# Patient Record
Sex: Male | Born: 1970
Health system: Southern US, Community
[De-identification: ages and names within clinical notes are randomized; demographics above are authoritative.]

## PROBLEM LIST (undated history)

## (undated) DIAGNOSIS — R7303 Prediabetes: Secondary | ICD-10-CM

## (undated) DIAGNOSIS — F32A Depression, unspecified: Secondary | ICD-10-CM

## (undated) DIAGNOSIS — M1990 Unspecified osteoarthritis, unspecified site: Secondary | ICD-10-CM

## (undated) DIAGNOSIS — F419 Anxiety disorder, unspecified: Secondary | ICD-10-CM

## (undated) DIAGNOSIS — G629 Polyneuropathy, unspecified: Secondary | ICD-10-CM

## (undated) DIAGNOSIS — G2581 Restless legs syndrome: Secondary | ICD-10-CM

## (undated) DIAGNOSIS — Z9989 Dependence on other enabling machines and devices: Secondary | ICD-10-CM

## (undated) DIAGNOSIS — E119 Type 2 diabetes mellitus without complications: Secondary | ICD-10-CM

## (undated) DIAGNOSIS — G4733 Obstructive sleep apnea (adult) (pediatric): Secondary | ICD-10-CM

## (undated) DIAGNOSIS — F329 Major depressive disorder, single episode, unspecified: Secondary | ICD-10-CM

## (undated) DIAGNOSIS — A4902 Methicillin resistant Staphylococcus aureus infection, unspecified site: Secondary | ICD-10-CM

## (undated) DIAGNOSIS — G43909 Migraine, unspecified, not intractable, without status migrainosus: Secondary | ICD-10-CM

## (undated) DIAGNOSIS — I10 Essential (primary) hypertension: Secondary | ICD-10-CM

## (undated) DIAGNOSIS — K219 Gastro-esophageal reflux disease without esophagitis: Secondary | ICD-10-CM

## (undated) DIAGNOSIS — F988 Other specified behavioral and emotional disorders with onset usually occurring in childhood and adolescence: Secondary | ICD-10-CM

## (undated) HISTORY — DX: Essential (primary) hypertension: I10

## (undated) HISTORY — DX: Depression, unspecified: F32.A

## (undated) HISTORY — DX: Anxiety disorder, unspecified: F41.9

## (undated) HISTORY — DX: Polyneuropathy, unspecified: G62.9

## (undated) HISTORY — DX: Other specified behavioral and emotional disorders with onset usually occurring in childhood and adolescence: F98.8

## (undated) HISTORY — PX: WISDOM TOOTH EXTRACTION: SHX21

## (undated) HISTORY — DX: Type 2 diabetes mellitus without complications: E11.9

## (undated) HISTORY — DX: Major depressive disorder, single episode, unspecified: F32.9

## (undated) HISTORY — DX: Gastro-esophageal reflux disease without esophagitis: K21.9

## (undated) HISTORY — PX: NO PAST SURGERIES: SHX2092

## (undated) HISTORY — PX: AMPUTATION: SHX166

---

## 2013-04-13 ENCOUNTER — Ambulatory Visit: Payer: Commercial Managed Care - PPO | Admitting: Neurology

## 2013-04-14 ENCOUNTER — Ambulatory Visit (INDEPENDENT_AMBULATORY_CARE_PROVIDER_SITE_OTHER): Payer: Commercial Managed Care - PPO | Admitting: Neurology

## 2013-04-14 ENCOUNTER — Encounter: Payer: Self-pay | Admitting: Neurology

## 2013-04-14 VITALS — BP 133/92 | HR 80 | Temp 98.4°F | Ht 74.0 in | Wt 258.0 lb

## 2013-04-14 DIAGNOSIS — G609 Hereditary and idiopathic neuropathy, unspecified: Secondary | ICD-10-CM

## 2013-04-14 DIAGNOSIS — G629 Polyneuropathy, unspecified: Secondary | ICD-10-CM | POA: Insufficient documentation

## 2013-04-14 NOTE — Progress Notes (Signed)
History of present illness:  [name] is a 42 years old right-handed Caucasian male, referred by his primary care physician Dr. Merchandiser, retail for evaluation of peripheral neuropathy  He had a past medical history sleep apnea, has been using CPAP machine for one year, high blood pressure, adult onset ADHD, he noticed a few year history of bilateral feet numbness, but no significant burning, stinging, needle taking, after prolonged weightbearing, he complains of soreness of bilateral feet. He denies significant gait difficulty,  Few months ago, he had right foot tablets, was evaluated by podiatrist, at evaluation of glucose, at 82, he denied family history of neuromuscular disease, he noticed hair-line in his midcalf level, there was shiny skin at distal leg  He drinks alcohol regularly, 3 x12 oz hard liquor mixed with soft drink each night  Laboratory Mar 22 2013, normal CBC, TSH, B12 350,  Review of Systems  Out of a complete 14 system review, the patient complains of only the following symptoms, and all other reviewed systems are negative.   Constitutional:   N/A Cardiovascular:  N/A Ear/Nose/Throat:  N/A Skin: N/A Eyes: N/A Respiratory: N/A Gastroitestinal: N/A    Hematology/Lymphatic:  N/A Endocrine:  N/A Musculoskeletal:N/A Allergy/Immunology: N/A Neurological: Numbness Psychiatric:    N/A  PHYSICAL EXAMINATOINS:  Generalized: In no acute distress  Neck: Supple, no carotid bruits   Cardiac: Regular rate rhythm  Pulmonary: Clear to auscultation bilaterally  Musculoskeletal: No deformity  Neurological examination  Mentation: Alert oriented to time, place, history taking, and causual conversation  Cranial nerve II-XII: Pupils were equal round reactive to light extraocular movements were full, visual field were full on confrontational test. facial sensation and strength were normal. hearing was intact to finger rubbing bilaterally. Uvula tongue midline.  head turning and  shoulder shrug and were normal and symmetric.Tongue protrusion into cheek strength was normal.  Motor: he has bilateral toe flexion/extension, weakness. Hair line at midcalf, shiny skin distally  Sensory: length dependent decreased fine touch, pinprick to midcalf level, absent toe vibratory sensation, and proprioception at toes  Coordination: Normal finger to nose, heel-to-shin bilaterally there was no truncal ataxia  Gait: Rising up from seated position without assistance, steady, but dragging his bilateral foot mildly, he has much difficulty standing up on tiptoe, or heels, Romberg signs: mild positive  Deep tendon reflexes: Brachioradialis absent, biceps absent, triceps absent Patellar 1/1, Achilles absent, plantar responses were flexor bilaterally.  Assessment and plan: 42 years old right-handed Caucasian male, with a long-standing history of rapid onset length dependent sensory changes, lack of positive sensory complaints,  1. Hereditary Vs. acquired peripheral neuropathy, with mixed demyelinating, and axonal features. 2 EMG nerve conduction study 3 Laboratory evaluations. Marland Kitchen

## 2013-04-18 LAB — IFE AND PE, SERUM
Albumin SerPl Elph-Mcnc: 4.2 g/dL (ref 3.2–5.6)
Albumin/Glob SerPl: 1.4 (ref 0.7–2.0)
B-Globulin SerPl Elph-Mcnc: 1.1 g/dL (ref 0.6–1.3)
Gamma Glob SerPl Elph-Mcnc: 1.2 g/dL (ref 0.5–1.6)
Globulin, Total: 3.2 g/dL (ref 2.0–4.5)
IgG (Immunoglobin G), Serum: 1227 mg/dL (ref 700–1600)

## 2013-04-18 LAB — ANA: Anti Nuclear Antibody(ANA): NEGATIVE

## 2013-04-18 LAB — RHEUMATOID FACTOR: Rhuematoid fact SerPl-aCnc: 7.7 IU/mL (ref 0.0–13.9)

## 2013-04-18 LAB — HEPATITIS B SURFACE ANTIBODY,QUALITATIVE: Hep B Surface Ab, Qual: NONREACTIVE

## 2013-04-18 LAB — RPR: RPR: NONREACTIVE

## 2013-04-18 LAB — SEDIMENTATION RATE: Sed Rate: 9 mm/hr (ref 0–15)

## 2013-04-18 LAB — HIV ANTIBODY (ROUTINE TESTING W REFLEX): HIV 1/O/2 Abs-Index Value: <1 (ref <1.00)

## 2013-04-28 ENCOUNTER — Encounter: Payer: Commercial Managed Care - PPO | Admitting: Radiology

## 2013-04-28 ENCOUNTER — Encounter: Payer: Commercial Managed Care - PPO | Admitting: Neurology

## 2013-05-25 ENCOUNTER — Ambulatory Visit (INDEPENDENT_AMBULATORY_CARE_PROVIDER_SITE_OTHER): Payer: 59 | Admitting: Neurology

## 2013-05-25 ENCOUNTER — Encounter (INDEPENDENT_AMBULATORY_CARE_PROVIDER_SITE_OTHER): Payer: 59

## 2013-05-25 DIAGNOSIS — G629 Polyneuropathy, unspecified: Secondary | ICD-10-CM

## 2013-05-25 DIAGNOSIS — Z0289 Encounter for other administrative examinations: Secondary | ICD-10-CM

## 2013-05-25 DIAGNOSIS — G609 Hereditary and idiopathic neuropathy, unspecified: Secondary | ICD-10-CM

## 2013-05-25 NOTE — Procedures (Signed)
    [facility]  NCS (NERVE CONDUCTION STUDY) WITH EMG (ELECTROMYOGRAPHY) REPORT   STUDY DATE: 05/25/2013 PATIENT NAME: Johnny Martinez DOB: [birth year] MRN: [id number]    TECHNOLOGIST: Gearldine Shown ELECTROMYOGRAPHER: Levert Feinstein M.D.  CLINICAL INFORMATION:    42 years old male, with history of gradual onset bilateral feet, and distal lower extremity numbness, receding of his hairline to midshin, shiny skin at his distal leg.  On exam: His hairline is at midshin, he has mild to moderate toe flexion/extension weakness.  Decreased light touch, pin prick to midshin level. DTR: bilateral patellar 1/1, absent ankle reflexes.   FINDINGS: NERVE CONDUCTION STUDY: Bilateral lower extremity motor and sensory responses were absent.  Right median, ulnar motor and sensory responses were present.  NEEDLE ELECTROMYOGRAPHY:  Selected needle examination was performed at right lower extremity muscles.  Right tibialis anterior, increased insertion activity, 2+ spontaneous activity, mildly enlarged motor unit potential, with mildly decreased recruitment patterns.  Right tibialis posterior, peroneal longus, increased insertion activity, 1 plus spontaneous activity, mildly enlarged motor unit potential, with mildly decreased recruitment patterns.  He could not tolerate more extensive needle examination.  IMPRESSION:   This is an abnormal study. There is electrodiagnostic evidence of length dependent axonal moderate to severe peripheral neuropathy.     INTERPRETING PHYSICIAN:   Levert Feinstein M.D. Ph.D. [facility] [address], [address] [place], [place] [address] [phone]

## 2013-11-27 ENCOUNTER — Ambulatory Visit: Payer: 59 | Admitting: Neurology

## 2014-05-22 ENCOUNTER — Other Ambulatory Visit (HOSPITAL_COMMUNITY): Payer: Self-pay | Admitting: Internal Medicine

## 2014-05-22 DIAGNOSIS — I739 Peripheral vascular disease, unspecified: Secondary | ICD-10-CM

## 2014-05-28 ENCOUNTER — Ambulatory Visit (HOSPITAL_COMMUNITY)
Admission: RE | Admit: 2014-05-28 | Discharge: 2014-05-28 | Disposition: A | Discharge status: Home / Self Care | Payer: 59 | Source: Ambulatory Visit | Attending: Internal Medicine | Admitting: Internal Medicine

## 2014-05-28 DIAGNOSIS — L539 Erythematous condition, unspecified: Secondary | ICD-10-CM | POA: Insufficient documentation

## 2014-05-28 DIAGNOSIS — I739 Peripheral vascular disease, unspecified: Secondary | ICD-10-CM | POA: Insufficient documentation

## 2014-06-28 ENCOUNTER — Encounter (HOSPITAL_BASED_OUTPATIENT_CLINIC_OR_DEPARTMENT_OTHER): Payer: 59 | Attending: Internal Medicine

## 2014-06-28 DIAGNOSIS — G608 Other hereditary and idiopathic neuropathies: Secondary | ICD-10-CM | POA: Diagnosis present

## 2014-06-28 DIAGNOSIS — L97409 Non-pressure chronic ulcer of unspecified heel and midfoot with unspecified severity: Secondary | ICD-10-CM | POA: Insufficient documentation

## 2014-06-28 DIAGNOSIS — I10 Essential (primary) hypertension: Secondary | ICD-10-CM | POA: Insufficient documentation

## 2014-06-28 NOTE — Progress Notes (Signed)
[facility]  NAME:  Johnny Martinez, Johnny Martinez               ACCOUNT NO.:  [id number]  MEDICAL RECORD NO.:  [id number]      DATE OF BIRTH:  1971-04-20  PHYSICIAN:  Ricard Dillon, M.D. VISIT DATE:  06/28/2014                                  OFFICE VISIT   HISTORY:  Mr. Roes is a 43 year old man who arrives for our review of wounds on his right greater than left foot.  He tells me that he has had tingling in his feet for roughly 4 or 5 years.  He has previously been reviewed by Neurology for this problem.  He has known hereditary versus axonal peripheral neuropathy with mixed demyelinating and axonal features, previously reviewed by Dr. Krista Blue at [facility] in June 2014.  He is not a diabetic.  He tells me over the last year, he has had a wound on the plantar aspect of his right fifth metatarsal head.  Over the last 6-7 months, wounds on the left first and fifth plantar metatarsal heads.  He has not been doing anything specific to address these wounds.  He works for long hours operating a Retail banker.  PAST MEDICAL HISTORY:  Idiopathic versus familial peripheral neuropathy (older brother also has the same condition), gastroesophageal reflux disease, and hypertension.  CURRENT MEDICATIONS:  Adderall 10 mg a day, Prinzide 20/12.5 everyday.  PHYSICAL EXAMINATION:  VITAL SIGNS:  Temperature 98.3, pulse 83, respirations 16, blood pressure 143/87.  VASCULAR ASSESSMENT:  ABIs as calculated in an outside center were 1.09 on the right, 1.06 on the left. Peripheral pulses were brisk  The most substantial area of concern is over the right fifth plantar metatarsal head.  This measured 0.3 x 0.3 x 0.4. There was considerable amount of undermining at 0.3 cm almost circumferentially.  There did not appear to be any evidence of infection here.  This did not probe the bone.  He also had calluses over the left fifth and first metatarsal heads.  At this point, I think these  are not open areas but it may respond to simple offloading.  IMPRESSION:  Neuropathic wound on predominantly the right fifth metatarsal head.  The area underwent a surgical debridement to remove skin and subcutaneous tissue and fully expose the wound.  This was dressed with silver alginate, a foam cover.  We also applied felt offloading in his shoes.  He needs these shoes to work.  We gave him a healing sandal with similar felt offloading, hopefully to relieve pressure over the right fifth metatarsal head to wear when he is not at work.  We will see him again in a week's time.  I did not think there was any infection here, did not culture this, and for now, I have not imaged the area.  As noted, he already came with normal ABIs.  There does not appear to be a vascular issue here.         ______________________________ Ricard Dillon, M.D.    MGR/MEDQ  D:  06/28/2014  T:  06/28/2014  Job:  [id number]

## 2014-07-05 ENCOUNTER — Encounter (HOSPITAL_BASED_OUTPATIENT_CLINIC_OR_DEPARTMENT_OTHER): Payer: 59 | Attending: Internal Medicine

## 2014-07-05 DIAGNOSIS — L97409 Non-pressure chronic ulcer of unspecified heel and midfoot with unspecified severity: Secondary | ICD-10-CM | POA: Diagnosis not present

## 2014-07-12 DIAGNOSIS — L97409 Non-pressure chronic ulcer of unspecified heel and midfoot with unspecified severity: Secondary | ICD-10-CM | POA: Diagnosis not present

## 2014-07-19 DIAGNOSIS — L97409 Non-pressure chronic ulcer of unspecified heel and midfoot with unspecified severity: Secondary | ICD-10-CM | POA: Diagnosis not present

## 2014-07-26 ENCOUNTER — Ambulatory Visit (HOSPITAL_COMMUNITY)
Admission: RE | Admit: 2014-07-26 | Discharge: 2014-07-26 | Disposition: A | Discharge status: Home / Self Care | Payer: 59 | Source: Ambulatory Visit | Attending: Internal Medicine | Admitting: Internal Medicine

## 2014-07-26 ENCOUNTER — Other Ambulatory Visit: Payer: Self-pay | Admitting: Internal Medicine

## 2014-07-26 DIAGNOSIS — M869 Osteomyelitis, unspecified: Secondary | ICD-10-CM | POA: Diagnosis present

## 2014-07-26 DIAGNOSIS — L97409 Non-pressure chronic ulcer of unspecified heel and midfoot with unspecified severity: Secondary | ICD-10-CM | POA: Diagnosis not present

## 2014-08-09 ENCOUNTER — Encounter (HOSPITAL_BASED_OUTPATIENT_CLINIC_OR_DEPARTMENT_OTHER): Payer: 59 | Attending: Internal Medicine

## 2014-08-09 DIAGNOSIS — G609 Hereditary and idiopathic neuropathy, unspecified: Secondary | ICD-10-CM | POA: Diagnosis not present

## 2014-08-09 DIAGNOSIS — L97511 Non-pressure chronic ulcer of other part of right foot limited to breakdown of skin: Secondary | ICD-10-CM | POA: Insufficient documentation

## 2014-08-16 DIAGNOSIS — L97511 Non-pressure chronic ulcer of other part of right foot limited to breakdown of skin: Secondary | ICD-10-CM | POA: Diagnosis not present

## 2014-08-16 DIAGNOSIS — G609 Hereditary and idiopathic neuropathy, unspecified: Secondary | ICD-10-CM | POA: Diagnosis not present

## 2014-08-23 DIAGNOSIS — L97511 Non-pressure chronic ulcer of other part of right foot limited to breakdown of skin: Secondary | ICD-10-CM | POA: Diagnosis not present

## 2014-08-23 DIAGNOSIS — G609 Hereditary and idiopathic neuropathy, unspecified: Secondary | ICD-10-CM | POA: Diagnosis not present

## 2014-09-06 ENCOUNTER — Encounter (HOSPITAL_BASED_OUTPATIENT_CLINIC_OR_DEPARTMENT_OTHER): Payer: 59 | Attending: Internal Medicine

## 2014-09-06 DIAGNOSIS — L97429 Non-pressure chronic ulcer of left heel and midfoot with unspecified severity: Secondary | ICD-10-CM | POA: Diagnosis not present

## 2014-09-06 DIAGNOSIS — L97419 Non-pressure chronic ulcer of right heel and midfoot with unspecified severity: Secondary | ICD-10-CM | POA: Insufficient documentation

## 2014-09-20 DIAGNOSIS — L97429 Non-pressure chronic ulcer of left heel and midfoot with unspecified severity: Secondary | ICD-10-CM | POA: Diagnosis not present

## 2014-09-20 DIAGNOSIS — L97419 Non-pressure chronic ulcer of right heel and midfoot with unspecified severity: Secondary | ICD-10-CM | POA: Diagnosis not present

## 2014-10-04 ENCOUNTER — Encounter (HOSPITAL_BASED_OUTPATIENT_CLINIC_OR_DEPARTMENT_OTHER): Payer: 59 | Attending: Internal Medicine

## 2014-10-04 DIAGNOSIS — L97529 Non-pressure chronic ulcer of other part of left foot with unspecified severity: Secondary | ICD-10-CM | POA: Insufficient documentation

## 2014-10-04 DIAGNOSIS — L97519 Non-pressure chronic ulcer of other part of right foot with unspecified severity: Secondary | ICD-10-CM | POA: Insufficient documentation

## 2014-10-07 ENCOUNTER — Encounter: Payer: Self-pay | Admitting: *Deleted

## 2014-10-19 DIAGNOSIS — L97529 Non-pressure chronic ulcer of other part of left foot with unspecified severity: Secondary | ICD-10-CM | POA: Diagnosis present

## 2014-10-19 DIAGNOSIS — L97519 Non-pressure chronic ulcer of other part of right foot with unspecified severity: Secondary | ICD-10-CM | POA: Diagnosis not present

## 2016-04-24 DIAGNOSIS — G589 Mononeuropathy, unspecified: Secondary | ICD-10-CM | POA: Diagnosis not present

## 2016-04-24 DIAGNOSIS — L89899 Pressure ulcer of other site, unspecified stage: Secondary | ICD-10-CM | POA: Diagnosis not present

## 2016-07-17 DIAGNOSIS — E782 Mixed hyperlipidemia: Secondary | ICD-10-CM | POA: Diagnosis not present

## 2016-07-17 DIAGNOSIS — R7301 Impaired fasting glucose: Secondary | ICD-10-CM | POA: Diagnosis not present

## 2016-07-31 DIAGNOSIS — R7301 Impaired fasting glucose: Secondary | ICD-10-CM | POA: Diagnosis not present

## 2016-07-31 DIAGNOSIS — Z0001 Encounter for general adult medical examination with abnormal findings: Secondary | ICD-10-CM | POA: Diagnosis not present

## 2016-07-31 DIAGNOSIS — I10 Essential (primary) hypertension: Secondary | ICD-10-CM | POA: Diagnosis not present

## 2016-07-31 DIAGNOSIS — F909 Attention-deficit hyperactivity disorder, unspecified type: Secondary | ICD-10-CM | POA: Diagnosis not present

## 2016-08-08 DIAGNOSIS — R7301 Impaired fasting glucose: Secondary | ICD-10-CM | POA: Diagnosis not present

## 2016-08-08 DIAGNOSIS — R55 Syncope and collapse: Secondary | ICD-10-CM | POA: Diagnosis not present

## 2016-08-11 DIAGNOSIS — R55 Syncope and collapse: Secondary | ICD-10-CM | POA: Diagnosis not present

## 2016-08-11 DIAGNOSIS — R42 Dizziness and giddiness: Secondary | ICD-10-CM | POA: Diagnosis not present

## 2016-08-11 DIAGNOSIS — Z716 Tobacco abuse counseling: Secondary | ICD-10-CM | POA: Diagnosis not present

## 2016-08-11 DIAGNOSIS — G4730 Sleep apnea, unspecified: Secondary | ICD-10-CM | POA: Diagnosis not present

## 2016-08-17 ENCOUNTER — Institutional Professional Consult (permissible substitution): Payer: Self-pay | Admitting: Cardiology

## 2016-09-04 DIAGNOSIS — G4733 Obstructive sleep apnea (adult) (pediatric): Secondary | ICD-10-CM | POA: Diagnosis not present

## 2016-09-04 DIAGNOSIS — R55 Syncope and collapse: Secondary | ICD-10-CM | POA: Diagnosis not present

## 2016-09-04 DIAGNOSIS — R6884 Jaw pain: Secondary | ICD-10-CM | POA: Diagnosis not present

## 2016-09-04 DIAGNOSIS — H9209 Otalgia, unspecified ear: Secondary | ICD-10-CM | POA: Diagnosis not present

## 2016-11-05 DIAGNOSIS — L03115 Cellulitis of right lower limb: Secondary | ICD-10-CM | POA: Diagnosis not present

## 2016-11-05 DIAGNOSIS — L97509 Non-pressure chronic ulcer of other part of unspecified foot with unspecified severity: Secondary | ICD-10-CM | POA: Diagnosis not present

## 2016-11-05 DIAGNOSIS — F909 Attention-deficit hyperactivity disorder, unspecified type: Secondary | ICD-10-CM | POA: Diagnosis not present

## 2016-11-06 ENCOUNTER — Telehealth: Payer: Self-pay | Admitting: Cardiology

## 2016-11-06 NOTE — Telephone Encounter (Signed)
Johnny Martinez is calling to see if Dr.Turner has gotten a medical necessity form and has signed it for Johnny Martinez . Please call

## 2016-11-07 DIAGNOSIS — L03031 Cellulitis of right toe: Secondary | ICD-10-CM | POA: Diagnosis not present

## 2016-11-07 DIAGNOSIS — Z6833 Body mass index (BMI) 33.0-33.9, adult: Secondary | ICD-10-CM | POA: Diagnosis not present

## 2016-11-12 ENCOUNTER — Telehealth: Payer: Self-pay | Admitting: *Deleted

## 2016-11-12 NOTE — Telephone Encounter (Signed)
Richardson Landry from  [facility] called inquiring about a medical necessity form he faxed over to be signed by dr Radford Pax. I found the form and faxed it over to steve today and I have received a confirmation for the fax. 11/12/2016

## 2016-11-12 NOTE — Telephone Encounter (Signed)
-----   Message from Sueanne Margarita, MD sent at 11/12/2016 10:11 AM EST ----- Regarding: RE: Medical necessity Please your paperwork - I signed everything in my folder  Traci ----- Message ----- From: Freada Bergeron, CMA Sent: 11/11/2016   5:13 PM To: Sueanne Margarita, MD Subject: Medical necessity                              Richardson Landry [facility])  is calling to see if Dr.Turner has gotten a medical necessity form and has signed it for Mr. Odonald

## 2016-11-14 DIAGNOSIS — L03031 Cellulitis of right toe: Secondary | ICD-10-CM | POA: Diagnosis not present

## 2017-02-23 DIAGNOSIS — H9202 Otalgia, left ear: Secondary | ICD-10-CM | POA: Diagnosis not present

## 2017-02-23 DIAGNOSIS — Z6832 Body mass index (BMI) 32.0-32.9, adult: Secondary | ICD-10-CM | POA: Diagnosis not present

## 2017-03-06 DIAGNOSIS — L03116 Cellulitis of left lower limb: Secondary | ICD-10-CM | POA: Diagnosis not present

## 2017-03-06 DIAGNOSIS — L97421 Non-pressure chronic ulcer of left heel and midfoot limited to breakdown of skin: Secondary | ICD-10-CM | POA: Diagnosis not present

## 2017-03-12 DIAGNOSIS — R7301 Impaired fasting glucose: Secondary | ICD-10-CM | POA: Diagnosis not present

## 2017-03-12 DIAGNOSIS — I10 Essential (primary) hypertension: Secondary | ICD-10-CM | POA: Diagnosis not present

## 2017-03-19 DIAGNOSIS — Z0001 Encounter for general adult medical examination with abnormal findings: Secondary | ICD-10-CM | POA: Diagnosis not present

## 2017-03-19 DIAGNOSIS — E875 Hyperkalemia: Secondary | ICD-10-CM | POA: Diagnosis not present

## 2017-03-19 DIAGNOSIS — R7301 Impaired fasting glucose: Secondary | ICD-10-CM | POA: Diagnosis not present

## 2017-03-19 DIAGNOSIS — F909 Attention-deficit hyperactivity disorder, unspecified type: Secondary | ICD-10-CM | POA: Diagnosis not present

## 2017-04-23 DIAGNOSIS — E871 Hypo-osmolality and hyponatremia: Secondary | ICD-10-CM | POA: Diagnosis not present

## 2017-05-17 ENCOUNTER — Ambulatory Visit (INDEPENDENT_AMBULATORY_CARE_PROVIDER_SITE_OTHER): Payer: BLUE CROSS/BLUE SHIELD | Admitting: Otolaryngology

## 2017-05-17 DIAGNOSIS — H6121 Impacted cerumen, right ear: Secondary | ICD-10-CM | POA: Diagnosis not present

## 2017-05-17 DIAGNOSIS — H93293 Other abnormal auditory perceptions, bilateral: Secondary | ICD-10-CM

## 2017-05-17 DIAGNOSIS — R42 Dizziness and giddiness: Secondary | ICD-10-CM

## 2017-09-18 DIAGNOSIS — R7301 Impaired fasting glucose: Secondary | ICD-10-CM | POA: Diagnosis not present

## 2017-09-18 DIAGNOSIS — I10 Essential (primary) hypertension: Secondary | ICD-10-CM | POA: Diagnosis not present

## 2017-09-22 DIAGNOSIS — F909 Attention-deficit hyperactivity disorder, unspecified type: Secondary | ICD-10-CM | POA: Diagnosis not present

## 2017-10-08 DIAGNOSIS — R05 Cough: Secondary | ICD-10-CM | POA: Diagnosis not present

## 2017-10-08 DIAGNOSIS — R070 Pain in throat: Secondary | ICD-10-CM | POA: Diagnosis not present

## 2017-10-08 DIAGNOSIS — R110 Nausea: Secondary | ICD-10-CM | POA: Diagnosis not present

## 2017-10-08 DIAGNOSIS — R509 Fever, unspecified: Secondary | ICD-10-CM | POA: Diagnosis not present

## 2017-10-13 DIAGNOSIS — L02612 Cutaneous abscess of left foot: Secondary | ICD-10-CM | POA: Diagnosis not present

## 2017-10-13 DIAGNOSIS — E875 Hyperkalemia: Secondary | ICD-10-CM | POA: Diagnosis not present

## 2017-10-13 DIAGNOSIS — F909 Attention-deficit hyperactivity disorder, unspecified type: Secondary | ICD-10-CM | POA: Diagnosis not present

## 2017-10-13 DIAGNOSIS — R600 Localized edema: Secondary | ICD-10-CM | POA: Diagnosis not present

## 2017-10-13 DIAGNOSIS — R7301 Impaired fasting glucose: Secondary | ICD-10-CM | POA: Diagnosis not present

## 2017-10-18 DIAGNOSIS — L02612 Cutaneous abscess of left foot: Secondary | ICD-10-CM | POA: Diagnosis not present

## 2017-10-27 ENCOUNTER — Other Ambulatory Visit: Payer: Self-pay | Admitting: Internal Medicine

## 2017-10-27 DIAGNOSIS — M86172 Other acute osteomyelitis, left ankle and foot: Secondary | ICD-10-CM

## 2017-10-27 DIAGNOSIS — L02612 Cutaneous abscess of left foot: Secondary | ICD-10-CM | POA: Diagnosis not present

## 2017-11-03 ENCOUNTER — Ambulatory Visit (HOSPITAL_COMMUNITY): Admission: RE | Admit: 2017-11-03 | Payer: BLUE CROSS/BLUE SHIELD | Source: Ambulatory Visit

## 2017-11-11 ENCOUNTER — Emergency Department (HOSPITAL_COMMUNITY): Payer: BLUE CROSS/BLUE SHIELD

## 2017-11-11 ENCOUNTER — Other Ambulatory Visit: Payer: Self-pay

## 2017-11-11 ENCOUNTER — Other Ambulatory Visit: Payer: Self-pay | Admitting: Internal Medicine

## 2017-11-11 ENCOUNTER — Encounter (HOSPITAL_COMMUNITY): Payer: Self-pay

## 2017-11-11 ENCOUNTER — Inpatient Hospital Stay (HOSPITAL_COMMUNITY)
Admission: EM | Admit: 2017-11-11 | Discharge: 2017-11-13 | DRG: 872 | Disposition: A | Discharge status: Home / Self Care | Payer: BLUE CROSS/BLUE SHIELD | Attending: Internal Medicine | Admitting: Internal Medicine

## 2017-11-11 DIAGNOSIS — M868X7 Other osteomyelitis, ankle and foot: Secondary | ICD-10-CM | POA: Diagnosis not present

## 2017-11-11 DIAGNOSIS — M869 Osteomyelitis, unspecified: Secondary | ICD-10-CM | POA: Diagnosis present

## 2017-11-11 DIAGNOSIS — L0291 Cutaneous abscess, unspecified: Secondary | ICD-10-CM

## 2017-11-11 DIAGNOSIS — F419 Anxiety disorder, unspecified: Secondary | ICD-10-CM | POA: Diagnosis not present

## 2017-11-11 DIAGNOSIS — G4733 Obstructive sleep apnea (adult) (pediatric): Secondary | ICD-10-CM | POA: Diagnosis not present

## 2017-11-11 DIAGNOSIS — Z841 Family history of disorders of kidney and ureter: Secondary | ICD-10-CM | POA: Diagnosis not present

## 2017-11-11 DIAGNOSIS — N179 Acute kidney failure, unspecified: Secondary | ICD-10-CM | POA: Diagnosis present

## 2017-11-11 DIAGNOSIS — E861 Hypovolemia: Secondary | ICD-10-CM | POA: Diagnosis present

## 2017-11-11 DIAGNOSIS — I959 Hypotension, unspecified: Secondary | ICD-10-CM | POA: Diagnosis present

## 2017-11-11 DIAGNOSIS — A419 Sepsis, unspecified organism: Secondary | ICD-10-CM | POA: Diagnosis not present

## 2017-11-11 DIAGNOSIS — L97826 Non-pressure chronic ulcer of other part of left lower leg with bone involvement without evidence of necrosis: Secondary | ICD-10-CM | POA: Diagnosis present

## 2017-11-11 DIAGNOSIS — R7303 Prediabetes: Secondary | ICD-10-CM | POA: Diagnosis not present

## 2017-11-11 DIAGNOSIS — K219 Gastro-esophageal reflux disease without esophagitis: Secondary | ICD-10-CM | POA: Diagnosis present

## 2017-11-11 DIAGNOSIS — L03115 Cellulitis of right lower limb: Secondary | ICD-10-CM | POA: Diagnosis not present

## 2017-11-11 DIAGNOSIS — M86672 Other chronic osteomyelitis, left ankle and foot: Secondary | ICD-10-CM | POA: Diagnosis present

## 2017-11-11 DIAGNOSIS — I10 Essential (primary) hypertension: Secondary | ICD-10-CM

## 2017-11-11 DIAGNOSIS — G629 Polyneuropathy, unspecified: Secondary | ICD-10-CM | POA: Diagnosis not present

## 2017-11-11 DIAGNOSIS — Z803 Family history of malignant neoplasm of breast: Secondary | ICD-10-CM

## 2017-11-11 DIAGNOSIS — M86272 Subacute osteomyelitis, left ankle and foot: Secondary | ICD-10-CM | POA: Diagnosis not present

## 2017-11-11 DIAGNOSIS — E871 Hypo-osmolality and hyponatremia: Secondary | ICD-10-CM | POA: Diagnosis present

## 2017-11-11 DIAGNOSIS — R6520 Severe sepsis without septic shock: Secondary | ICD-10-CM | POA: Diagnosis not present

## 2017-11-11 DIAGNOSIS — A4102 Sepsis due to Methicillin resistant Staphylococcus aureus: Secondary | ICD-10-CM | POA: Diagnosis not present

## 2017-11-11 DIAGNOSIS — L02612 Cutaneous abscess of left foot: Secondary | ICD-10-CM

## 2017-11-11 DIAGNOSIS — F988 Other specified behavioral and emotional disorders with onset usually occurring in childhood and adolescence: Secondary | ICD-10-CM

## 2017-11-11 DIAGNOSIS — M86172 Other acute osteomyelitis, left ankle and foot: Secondary | ICD-10-CM | POA: Diagnosis not present

## 2017-11-11 DIAGNOSIS — L03116 Cellulitis of left lower limb: Secondary | ICD-10-CM | POA: Diagnosis not present

## 2017-11-11 HISTORY — DX: Dependence on other enabling machines and devices: Z99.89

## 2017-11-11 HISTORY — DX: Obstructive sleep apnea (adult) (pediatric): G47.33

## 2017-11-11 HISTORY — DX: Migraine, unspecified, not intractable, without status migrainosus: G43.909

## 2017-11-11 HISTORY — DX: Unspecified osteoarthritis, unspecified site: M19.90

## 2017-11-11 LAB — COMPREHENSIVE METABOLIC PANEL
ALT: 18 U/L (ref 17–63)
AST: 24 U/L (ref 15–41)
Albumin: 3.5 g/dL (ref 3.5–5.0)
Alkaline Phosphatase: 76 U/L (ref 38–126)
Anion gap: 10 (ref 5–15)
BUN: 15 mg/dL (ref 6–20)
CALCIUM: 8.8 mg/dL — AB (ref 8.9–10.3)
CO2: 23 mmol/L (ref 22–32)
CREATININE: 1.12 mg/dL (ref 0.61–1.24)
Chloride: 95 mmol/L — ABNORMAL LOW (ref 101–111)
Glucose, Bld: 143 mg/dL — ABNORMAL HIGH (ref 65–99)
Potassium: 4 mmol/L (ref 3.5–5.1)
Sodium: 128 mmol/L — ABNORMAL LOW (ref 135–145)
Total Bilirubin: 0.9 mg/dL (ref 0.3–1.2)
Total Protein: 7.7 g/dL (ref 6.5–8.1)

## 2017-11-11 LAB — CBC WITH DIFFERENTIAL/PLATELET
BASOS ABS: 0.1 10*3/uL (ref 0.0–0.1)
Basophils Relative: 0 %
EOS ABS: 0 10*3/uL (ref 0.0–0.7)
Eosinophils Relative: 0 %
HCT: 34.6 % — ABNORMAL LOW (ref 39.0–52.0)
Hemoglobin: 11.5 g/dL — ABNORMAL LOW (ref 13.0–17.0)
Lymphocytes Relative: 7 %
Lymphs Abs: 1 10*3/uL (ref 0.7–4.0)
MCH: 30.9 pg (ref 26.0–34.0)
MCHC: 33.2 g/dL (ref 30.0–36.0)
MCV: 93 fL (ref 78.0–100.0)
MONO ABS: 0.9 10*3/uL (ref 0.1–1.0)
Monocytes Relative: 6 %
Neutro Abs: 11.7 10*3/uL — ABNORMAL HIGH (ref 1.7–7.7)
Neutrophils Relative %: 87 %
PLATELETS: 242 10*3/uL (ref 150–400)
RBC: 3.72 MIL/uL — AB (ref 4.22–5.81)
RDW: 13 % (ref 11.5–15.5)
WBC: 13.6 10*3/uL — AB (ref 4.0–10.5)

## 2017-11-11 LAB — PROTIME-INR
INR: 1.12
Prothrombin Time: 14.3 seconds (ref 11.4–15.2)

## 2017-11-11 LAB — GLUCOSE, CAPILLARY: Glucose-Capillary: 164 mg/dL — ABNORMAL HIGH (ref 65–99)

## 2017-11-11 LAB — BASIC METABOLIC PANEL
Anion gap: 8 (ref 5–15)
BUN: 14 mg/dL (ref 6–20)
CALCIUM: 7.1 mg/dL — AB (ref 8.9–10.3)
CO2: 20 mmol/L — AB (ref 22–32)
CREATININE: 1.16 mg/dL (ref 0.61–1.24)
Chloride: 102 mmol/L (ref 101–111)
GFR calc Af Amer: >60 mL/min (ref >60)
GFR calc non Af Amer: >60 mL/min (ref >60)
GLUCOSE: 174 mg/dL — AB (ref 65–99)
Potassium: 3.3 mmol/L — ABNORMAL LOW (ref 3.5–5.1)
Sodium: 130 mmol/L — ABNORMAL LOW (ref 135–145)

## 2017-11-11 LAB — I-STAT CG4 LACTIC ACID, ED: LACTIC ACID, VENOUS: 1.51 mmol/L (ref 0.5–1.9)

## 2017-11-11 LAB — PROCALCITONIN: Procalcitonin: 2.44 ng/mL

## 2017-11-11 MED ORDER — ONDANSETRON HCL 4 MG/2ML IJ SOLN
4.0000 mg | Freq: Once | INTRAMUSCULAR | Status: AC
  Administered 2017-11-11: 4 mg via INTRAVENOUS
  Filled 2017-11-11: qty 2

## 2017-11-11 MED ORDER — PIPERACILLIN-TAZOBACTAM 3.375 G IVPB 30 MIN
3.3750 g | Freq: Once | INTRAVENOUS | Status: AC
  Administered 2017-11-11: 3.375 g via INTRAVENOUS
  Filled 2017-11-11: qty 50

## 2017-11-11 MED ORDER — ACETAMINOPHEN 325 MG PO TABS
650.0000 mg | ORAL_TABLET | ORAL | Status: DC | PRN
  Administered 2017-11-13: 650 mg via ORAL
  Filled 2017-11-11: qty 2

## 2017-11-11 MED ORDER — SODIUM CHLORIDE 0.9 % IV BOLUS (SEPSIS)
1000.0000 mL | Freq: Once | INTRAVENOUS | Status: AC
  Administered 2017-11-11: 1000 mL via INTRAVENOUS

## 2017-11-11 MED ORDER — SODIUM CHLORIDE 0.9 % IV SOLN
1.0000 g | Freq: Once | INTRAVENOUS | Status: AC
  Administered 2017-11-11: 1 g via INTRAVENOUS
  Filled 2017-11-11: qty 10

## 2017-11-11 MED ORDER — SODIUM CHLORIDE 0.9 % IV BOLUS (SEPSIS)
600.0000 mL | Freq: Once | INTRAVENOUS | Status: AC
  Administered 2017-11-11: 600 mL via INTRAVENOUS

## 2017-11-11 MED ORDER — HEPARIN SODIUM (PORCINE) 5000 UNIT/ML IJ SOLN
5000.0000 [IU] | Freq: Three times a day (TID) | INTRAMUSCULAR | Status: DC
  Administered 2017-11-12 – 2017-11-13 (×4): 5000 [IU] via SUBCUTANEOUS
  Filled 2017-11-11 (×5): qty 1

## 2017-11-11 MED ORDER — VANCOMYCIN HCL IN DEXTROSE 1-5 GM/200ML-% IV SOLN
1000.0000 mg | Freq: Two times a day (BID) | INTRAVENOUS | Status: DC
  Administered 2017-11-12: 1000 mg via INTRAVENOUS
  Filled 2017-11-11 (×2): qty 200

## 2017-11-11 MED ORDER — INSULIN ASPART 100 UNIT/ML ~~LOC~~ SOLN: 0.0000 [IU] | Freq: Every day | SUBCUTANEOUS | Status: DC

## 2017-11-11 MED ORDER — VANCOMYCIN HCL 10 G IV SOLR
2500.0000 mg | Freq: Once | INTRAVENOUS | Status: AC
  Administered 2017-11-11: 2500 mg via INTRAVENOUS
  Filled 2017-11-11: qty 2500

## 2017-11-11 MED ORDER — ONDANSETRON HCL 4 MG/2ML IJ SOLN
4.0000 mg | Freq: Once | INTRAMUSCULAR | Status: AC
Start: 2017-11-11 — End: 2017-11-11
  Administered 2017-11-11: 4 mg via INTRAVENOUS
  Filled 2017-11-11: qty 2

## 2017-11-11 MED ORDER — SODIUM CHLORIDE 0.9 % IV SOLN
INTRAVENOUS | Status: DC
  Administered 2017-11-11 – 2017-11-12 (×3): via INTRAVENOUS

## 2017-11-11 MED ORDER — PIPERACILLIN-TAZOBACTAM 3.375 G IVPB
3.3750 g | Freq: Three times a day (TID) | INTRAVENOUS | Status: DC
  Administered 2017-11-12 – 2017-11-13 (×5): 3.375 g via INTRAVENOUS
  Filled 2017-11-11 (×6): qty 50

## 2017-11-11 MED ORDER — SODIUM CHLORIDE 0.9 % IV SOLN: 250.0000 mL | INTRAVENOUS | Status: DC | PRN

## 2017-11-11 MED ORDER — INSULIN ASPART 100 UNIT/ML ~~LOC~~ SOLN
0.0000 [IU] | Freq: Three times a day (TID) | SUBCUTANEOUS | Status: DC
  Administered 2017-11-12 (×2): 3 [IU] via SUBCUTANEOUS
  Administered 2017-11-13 (×2): 2 [IU] via SUBCUTANEOUS

## 2017-11-11 MED ORDER — ACETAMINOPHEN 325 MG PO TABS
650.0000 mg | ORAL_TABLET | Freq: Once | ORAL | Status: AC
  Administered 2017-11-11: 650 mg via ORAL
  Filled 2017-11-11: qty 2

## 2017-11-11 MED ORDER — VANCOMYCIN HCL IN DEXTROSE 1-5 GM/200ML-% IV SOLN: 1000.0000 mg | Freq: Once | INTRAVENOUS | Status: DC

## 2017-11-11 MED ORDER — ONDANSETRON HCL 4 MG/2ML IJ SOLN
4.0000 mg | Freq: Four times a day (QID) | INTRAMUSCULAR | Status: DC | PRN
  Administered 2017-11-12: 4 mg via INTRAVENOUS
  Filled 2017-11-11: qty 2

## 2017-11-11 NOTE — ED Provider Notes (Signed)
Medical screening exam  47 year old male presents today with complaints of wound infection.  Patient notes a history of distal neuropathy causing ulcers on his feet.  Patient notes that on and off ulcer on his left foot, most recently placed on antibiotics mid December.  He notes he took amoxicillin and was switched to clindamycin.  He notes that his last dose of clindamycin was 2 days ago, he reports 2 days ago symptoms began worsening with nausea or fever fatigue.  When asked if his foot has worsened patient reports that he does not watch his foot and cannot determine if it has worsened.  Patient notes the swelling is located over the toes and plantar aspect of the foot.  He was sent here from primary care for imaging of the foot.  Patient denies any vomiting, reports taking hydrocodone prior to arrival.   On exam patient has swelling and what appears to be abscess in the interdigital space of the left foot with swelling to the toes.  Patient has some surrounding redness.   Patient appears skeptically ill with a temperature of 104, tachycardia, blood pressure 94/56.  I suspect the source is the patient's foot.  He will be started on prophylactic antibiotics, weight-based fluid resuscitation ordered, blood cultures and patient will be moved to an acute bed for ongoing management.  Nursing staff made aware that patient would require acute bed.    Vitals:   11/11/17 1717 11/11/17 1801  BP: (!) 94/56   Pulse: (!) 125   Resp: (!) 22   Temp: (!) 102.6 F (39.2 C) (S) (!) 104.1 F (40.1 C)  SpO2: 95%           Okey Regal, PA-C 11/11/17 1813

## 2017-11-11 NOTE — ED Notes (Signed)
Pt states that he took his last dose of clindamycin on Monday went to his primary today for check up. Pt foot has been swollen states that when taking his clindamycin he started to feel better but on Tuesday is when he started to have fevers and chills. Doesn't know when the swelling started but knows that it has been a ongoing battle since chirstmas time. He was prescibed  amoxicilllin by is pcp but did not help and then he was put on clindamycin which seem to do the trick. Pt denise any pain in the left foot. On my assessment the left middle toe and under the toe was swollen red and warm to the touch. Pt also has on the right leg a red area under the calf that also started on Tuesday.

## 2017-11-11 NOTE — ED Provider Notes (Signed)
[place] EMERGENCY DEPARTMENT Provider Note   CSN: [id number] Arrival date & time: 11/11/17  1649     History   Chief Complaint Chief Complaint  Patient presents with  . Wound Infection    HPI Johnny Martinez is a 47 y.o. male.  HPI   47 year old male presents today with complaints of wound infection.  Patient notes a history of distal neuropathy causing ulcers on his feet.  Patient notes that on and off ulcer on his left foot, most recently placed on antibiotics mid December.  He notes he took amoxicillin and was switched to clindamycin.  He notes that his last dose of clindamycin was 2 days ago, he reports 2 days ago symptoms began worsening with nausea or fever fatigue.  When asked if his foot has worsened patient reports that he does not watch his foot and cannot determine if it has worsened.  Patient notes the swelling is located over the toes and plantar aspect of the foot.  He was sent here from primary care for imaging of the foot.  Patient denies any vomiting, reports taking hydrocodone prior to arrival.   Past Medical History:  Diagnosis Date  . ADD (attention deficit disorder)    adult  . Anxiety and depression   . Diabetes mellitus without complication ([place])    pre  . GERD (gastroesophageal reflux disease)   . Hypertension   . Neuropathy   . Sleep apnea     Patient Active Problem List   Diagnosis Date Noted  . Peripheral neuropathy 04/14/2013    History reviewed. No pertinent surgical history.     Home Medications    Prior to Admission medications   Medication Sig Start Date End Date Taking? Authorizing Provider  amphetamine-dextroamphetamine (ADDERALL) 10 MG tablet Take 10 mg by mouth daily.    [provider]  lisinopril-hydrochlorothiazide (PRINZIDE,ZESTORETIC) 20-12.5 MG per tablet Take 1 tablet by mouth daily.    [provider]  omeprazole (PRILOSEC) 20 MG capsule Take 20 mg by mouth daily.    [provider]    Family History Family History  Problem Relation Age of Onset  . Cancer Mother   . Kidney failure Father   . Breast cancer Sister     Social History Social History   Tobacco Use  . Smoking status: Never Smoker  . Smokeless tobacco: Never Used  Substance Use Topics  . Alcohol use: Yes    Comment: occasionally  . Drug use: Not on file     Allergies   Iodine   Review of Systems Review of Systems  All other systems reviewed and are negative.   Physical Exam Updated Vital Signs BP (!) 92/52   Pulse 76   Temp (S) (!) 104.1 F (40.1 C) (Rectal)   Resp 17   Wt 120.7 kg (266 lb)   SpO2 96%   BMI 34.15 kg/m   Physical Exam  Constitutional: He is oriented to person, place, and time. He appears well-developed and well-nourished.  HENT:  Head: Normocephalic and atraumatic.  Eyes: Conjunctivae are normal. Pupils are equal, round, and reactive to light. Right eye exhibits no discharge. Left eye exhibits no discharge. No scleral icterus.  Neck: Normal range of motion. No JVD present. No tracheal deviation present.  Cardiovascular: Regular rhythm, normal heart sounds and intact distal pulses. Exam reveals no gallop and no friction rub.  No murmur heard. Pulmonary/Chest: Effort normal and breath sounds normal. No stridor. No respiratory distress. He  has no wheezes. He has no rales. He exhibits no tenderness.  Abdominal: Soft. He exhibits no distension. There is no tenderness.  Musculoskeletal:  Swelling with fluctuance noted to left interdigit space as noted in picture  Erythema noted to lower extremity at the level of distal tib  Neurological: He is alert and oriented to person, place, and time. Coordination normal.  Psychiatric: He has a normal mood and affect. His behavior is normal. Judgment and thought content normal.  Nursing note and vitals reviewed.        ED Treatments / Results  Labs (all labs ordered are listed, but only abnormal  results are displayed) Labs Reviewed  COMPREHENSIVE METABOLIC PANEL - Abnormal; Notable for the following components:      Result Value   Sodium 128 (*)    Chloride 95 (*)    Glucose, Bld 143 (*)    Calcium 8.8 (*)    All other components within normal limits  CBC WITH DIFFERENTIAL/PLATELET - Abnormal; Notable for the following components:   WBC 13.6 (*)    RBC 3.72 (*)    Hemoglobin 11.5 (*)    HCT 34.6 (*)    Neutro Abs 11.7 (*)    All other components within normal limits  CULTURE, BLOOD (ROUTINE X 2)  CULTURE, BLOOD (ROUTINE X 2)  PROTIME-INR  URINALYSIS, ROUTINE W REFLEX MICROSCOPIC  I-STAT CG4 LACTIC ACID, ED  I-STAT CG4 LACTIC ACID, ED  I-STAT CG4 LACTIC ACID, ED  I-STAT CG4 LACTIC ACID, ED    EKG  EKG Interpretation None       Radiology Dg Foot Complete Left  Result Date: 11/11/2017 CLINICAL DATA:  Left foot infection. Blood cultures positive for MRSA in left foot wound. EXAM: LEFT FOOT - COMPLETE 3+ VIEW COMPARISON:  None. FINDINGS: Diffuse soft tissue swelling of the included ankle and foot. Marked soft tissue swelling of the left second toe is also noted. Periosteal new bone formation is seen along the second proximal phalanx with bone destruction involving the neck of the second metatarsal. The second metatarsal also demonstrates periosteal new bone formation along the distal shaft. Findings are in keeping with changes of acute to subacute osteomyelitis. Erosive osteoarthritis of the second PIP joint is noted with gullwing configuration of the joint. No acute fracture is noted. No joint dislocations. IMPRESSION: IMPRESSION 1. Periosteal new bone formation is seen about the left second ray involving the distal second metatarsal shaft and proximal phalanx. 2. Bone destruction involving the neck of the second metatarsal is identified with associated soft tissue swelling of the foot and second digit. The constellation of these findings would be in keeping with acute to  subacute osteomyelitis. Electronically Signed   By: Ashley Royalty M.D.   On: 11/11/2017 19:13    Procedures Procedures (including critical care time)  CRITICAL CARE Performed by: Stevie Kern Lemario Chaikin   Total critical care time: 35 minutes  Critical care time was exclusive of separately billable procedures and treating other patients.  Critical care was necessary to treat or prevent imminent or life-threatening deterioration.  Critical care was time spent personally by me on the following activities: development of treatment plan with patient and/or surrogate as well as nursing, discussions with consultants, evaluation of patient's response to treatment, examination of patient, obtaining history from patient or surrogate, ordering and performing treatments and interventions, ordering and review of laboratory studies, ordering and review of radiographic studies, pulse oximetry and re-evaluation of patient's condition.   Medications Ordered in ED Medications  vancomycin (VANCOCIN) IVPB 1000 mg/200 mL premix (not administered)  piperacillin-tazobactam (ZOSYN) IVPB 3.375 g (not administered)  acetaminophen (TYLENOL) tablet 650 mg (650 mg Oral Given 11/11/17 1734)  piperacillin-tazobactam (ZOSYN) IVPB 3.375 g (0 g Intravenous Stopped 11/11/17 1909)  sodium chloride 0.9 % bolus 1,000 mL (0 mLs Intravenous Stopped 11/11/17 1910)  ondansetron (ZOFRAN) injection 4 mg (4 mg Intravenous Given 11/11/17 1816)  vancomycin (VANCOCIN) 2,500 mg in sodium chloride 0.9 % 500 mL IVPB (2,500 mg Intravenous New Bag/Given 11/11/17 1843)  sodium chloride 0.9 % bolus 1,000 mL (0 mLs Intravenous Stopped 11/11/17 1910)  sodium chloride 0.9 % bolus 1,000 mL (0 mLs Intravenous Stopped 11/11/17 1909)  sodium chloride 0.9 % bolus 600 mL (600 mLs Intravenous New Bag/Given 11/11/17 2009)     Initial Impression / Assessment and Plan / ED Course  I have reviewed the triage vital signs and the nursing notes.  Pertinent labs &  imaging results that were available during my care of the patient were reviewed by me and considered in my medical decision making (see chart for details).      Sepsis - Repeat Assessment  Performed at:    8:35  Vitals     Blood pressure (!) 82/57, pulse 72, temperature (S) (!) 104.1 F (40.1 C), temperature source Rectal, resp. rate 16, weight 120.7 kg (266 lb), SpO2 96 %.  Heart:     Regular rate and rhythm  Lungs:    CTA  Capillary Refill:   <2 sec  Peripheral Pulse:   Radial pulse palpable  Skin:     Normal Color    Final Clinical Impressions(s) / ED Diagnoses   Final diagnoses:  Sepsis, due to unspecified organism ([place])  Osteomyelitis of left foot, unspecified type ([place])   Labs: I-STAT lactic acid, blood cultures, CMP, CBC, PT/INR  Imaging: DG foot left  Consults: Orthopedics, critical care  Therapeutics: Vanco Zosyn  Discharge Meds:   Assessment/Plan:   47 year old male presents today with sepsis likely secondary to foot infection with osteomyelitis.  Patient is well-appearing in no acute distress, but his vital signs show significant infection including temperature of 104.1 and persistent hypokalemia while here.  Patient has blood pressure readings documented down into the 80s, but also has some even lower than that.  Patient is perfusing very well in no acute distress.  Patient has been given a 3600 ml and still is hypotensive.  Given these findings I find it reasonable to consult critical care for evaluation and ongoing management.  Orthopedics was consulted I spoke with Dr.Xu who will have 1 of his partners likely Dr. Sharol Given  to evaluate the patient tomorrow morning.      ED Discharge Orders    None       Francee Gentile 11/11/17 2126    Jola Schmidt, MD 11/11/17 [phone]

## 2017-11-11 NOTE — H&P (Signed)
Name: Johnny Martinez MRN: [id number] DOB: 1971-07-30    ADMISSION DATE:  11/11/2017 CONSULTATION DATE:  11/11/17  REFERRING MD :  Venora Maples  CHIEF COMPLAINT:  Left foot ulcer   HISTORY OF PRESENT ILLNESS:  Johnny Martinez is a 47 y.o. male with a PMH as outlined below.  He presented to [facility] ED 11/11/17 after his PCP sent him there for imaging of his left foot which had plantar ulcer that had cultures return as MRSA.  Imaging in ED showed concern for acute or subacute osteo. Per pt, ulcer first showed up before Christmas.  He was started on abx (amoxillin first and later clinda).  Ulcer then got better; however, showed up again.  While in ED, he had hypotension that did respond somewhat to fluids; however, after 4L, he still had SBP in the low 90's.  PCCM subsequently called for admission.  Pt denies any recent fevers/chills/sweats, headaches, chest pain, dyspnea, V/D, abd pain, myalgias.  He has has nausea as well as decreased PO intake x 3 - 4 days.  In ED, rectal temp was noted to be 104.  He was started on empiric vanc / zosyn.   PAST MEDICAL HISTORY :   has a past medical history of ADD (attention deficit disorder), Anxiety and depression, Diabetes mellitus without complication ([place]), GERD (gastroesophageal reflux disease), Hypertension, Neuropathy, and Sleep apnea.  has no past surgical history on file. Prior to Admission medications   Medication Sig Start Date End Date Taking? Authorizing Provider  amphetamine-dextroamphetamine (ADDERALL) 10 MG tablet Take 10 mg by mouth daily.    [provider]  lisinopril-hydrochlorothiazide (PRINZIDE,ZESTORETIC) 20-12.5 MG per tablet Take 1 tablet by mouth daily.    [provider]  omeprazole (PRILOSEC) 20 MG capsule Take 20 mg by mouth daily.    [provider]   Allergies  Allergen Reactions  . Iodine     FAMILY HISTORY:  family history includes Breast cancer in his sister; Cancer in his mother; Kidney failure in his  father. SOCIAL HISTORY:  reports that  has never smoked. he has never used smokeless tobacco. He reports that he drinks alcohol.  REVIEW OF SYSTEMS:   All negative; except for those that are bolded, which indicate positives.  Constitutional: weight loss, weight gain, night sweats, fevers, chills, fatigue, weakness.  HEENT: headaches, sore throat, sneezing, nasal congestion, post nasal drip, difficulty swallowing, tooth/dental problems, visual complaints, visual changes, ear aches. Neuro: difficulty with speech, weakness, numbness, ataxia. CV:  chest pain, orthopnea, PND, swelling in lower extremities, dizziness, palpitations, syncope.  Resp: cough, hemoptysis, dyspnea, wheezing. GI: heartburn, indigestion, abdominal pain, nausea, vomiting, diarrhea, constipation, change in bowel habits, loss of appetite, hematemesis, melena, hematochezia.  GU: dysuria, change in color of urine, urgency or frequency, flank pain, hematuria. MSK: joint pain or swelling, decreased range of motion. Psych: change in mood or affect, depression, anxiety, suicidal ideations, homicidal ideations. Skin: rash, itching, bruising.    SUBJECTIVE:  Has not had appetite for past 3 days, but now feels hungry. Awake and alert.  VITAL SIGNS: Temp:  [102.6 F (39.2 C)-104.1 F (40.1 C)] 104.1 F (40.1 C) (01/10 1801) Pulse Rate:  [72-125] 79 (01/10 2045) Resp:  [16-23] 17 (01/10 2045) BP: (80-94)/(49-58) 87/56 (01/10 2045) SpO2:  [95 %-98 %] 95 % (01/10 2045) Weight:  [120.7 kg (266 lb)] 120.7 kg (266 lb) (01/10 1801)  PHYSICAL EXAMINATION: General: Adult male, resting in bed, in NAD. Neuro: A&O x 3, non-focal.  HEENT: [place]/AT. PERRL, sclerae  anicteric. Cardiovascular: RRR, no M/R/G.  Lungs: Respirations even and unlabored.  CTA bilaterally, No W/R/R. Abdomen: BS x 4, soft, NT/ND.  Musculoskeletal:Left foot with plantar ulcer, no drainage.  Left foot slightly warm, edematous to knee.  Right foot with small plantar  wound, no drainage. Skin: As above, warm, no rashes.    Recent Labs  Lab 11/11/17 1728  NA 128*  K 4.0  CL 95*  CO2 23  BUN 15  CREATININE 1.12  GLUCOSE 143*   Recent Labs  Lab 11/11/17 1728  HGB 11.5*  HCT 34.6*  WBC 13.6*  PLT 242   Dg Foot Complete Left  Result Date: 11/11/2017 CLINICAL DATA:  Left foot infection. Blood cultures positive for MRSA in left foot wound. EXAM: LEFT FOOT - COMPLETE 3+ VIEW COMPARISON:  None. FINDINGS: Diffuse soft tissue swelling of the included ankle and foot. Marked soft tissue swelling of the left second toe is also noted. Periosteal new bone formation is seen along the second proximal phalanx with bone destruction involving the neck of the second metatarsal. The second metatarsal also demonstrates periosteal new bone formation along the distal shaft. Findings are in keeping with changes of acute to subacute osteomyelitis. Erosive osteoarthritis of the second PIP joint is noted with gullwing configuration of the joint. No acute fracture is noted. No joint dislocations. IMPRESSION: IMPRESSION 1. Periosteal new bone formation is seen about the left second ray involving the distal second metatarsal shaft and proximal phalanx. 2. Bone destruction involving the neck of the second metatarsal is identified with associated soft tissue swelling of the foot and second digit. The constellation of these findings would be in keeping with acute to subacute osteomyelitis. Electronically Signed   By: Ashley Royalty M.D.   On: 11/11/2017 19:13    STUDIES:  L foot Xray 1/10 > acute to subacute osteomyelitis.  SIGNIFICANT EVENTS  1/10 > admit with sepsis due to left foot diabetic ulcer (MRSA positive per pt).  ASSESSMENT / PLAN:  Sepsis - presumed due to left foot MRSA positive ulcer (per pt, cultures drawn at PCP office).  Was on amoxicillin and later clindamycin and ulcer did improve; however, never fully healed.  A repeat sepsis assessment has been performed.   Hypotension also likely in part due to hypovolemia given decreased to minimal PO intake x 3 days.  Lactate reassuring. Hx HTN. Plan: Continue supportive care. Additional 1L bolus now then MIVF at 125cc/hr. No role pressors as long as mental status remains clear and SBP > 90.  Would continue to fluid resuscitate. Continue empiric vanc / zosyn and follow cultures. Hold preadmission lisinopril-HCTZ. Ortho consulted by EDP, will see in AM 1/11.  DM - not on meds. Plan: SSI AC / HS.  Hyponatremia - presumed hypovolemic from decreased PO intake. Hypocalcemia. Plan: Continue NS @ 125. 1g Ca gluconate. Follow BMP.  Hx anxiety, ADD. Plan: Hold preadmission adderall.   Best Practice Code Status:  Full. Diet:  Carb Mod. VTE prophylaxis:  Heparin / SCD's. Dispo:  SDU.   Admit to SDU for tonight.  If remains stable and BP continues to respond, then transfer to [facility] in [place], [place] [facility] Pager: [phone]  or [phone] 11/11/2017, 8:51 PM

## 2017-11-11 NOTE — Progress Notes (Signed)
Pharmacy Antibiotic Note  Johnny Martinez is a 47 y.o. male admitted on 11/11/2017 with cellulitis.  Pharmacy has been consulted for vancomycin and Zosyn dosing. Pt is febrile at 104.33F and tachycardic at 125 bpm. Pt reports recent use of amoxicillin and clindamycin (last dose 2 days ago) for foot ulcers. Pt reports recent weight as 266 lbs in ED. Elevated lactate 1.51, Elevated WBC 13.6, Scr 1.12, Normalized CrCl 82.1  Plan: Vancomycin 2500mg  IV x1 Vancomycin 1000mg  IV q12h. Goal trough 10-15 mcg/mL. Zosyn 3.375gm IV x1 Zosyn 3.375gm IV q8h (4 hour infusion) F/u renal fxn, trough @ SS, clinical resolution  Weight: 266 lb (120.7 kg)  Temp (24hrs), Avg:103.4 F (39.7 C), Min:102.6 F (39.2 C), Max:104.1 F (40.1 C)  Recent Labs  Lab 11/11/17 1755  LATICACIDVEN 1.51    CrCl cannot be calculated (No order found.).    Allergies  Allergen Reactions  . Iodine     Antimicrobials this admission: Vanc 1/10 >>  Zosyn 1/10 >>   Microbiology results: Per RN note, pt arrived to ED d/t culture positive for MRSA in left foot wound Blood culture pending  Thank you for allowing pharmacy to be a part of this patient's care.  Monick Rena 11/11/2017 6:12 PM

## 2017-11-11 NOTE — ED Notes (Signed)
Attempted report 

## 2017-11-11 NOTE — ED Notes (Signed)
Admitting MD at bedside.

## 2017-11-11 NOTE — ED Triage Notes (Signed)
Pt arrives to ED with family d/t culture positive for MRSA in left foot wound. Pt sent in by PCP for imaging. PT meets sepsis protocol in triage

## 2017-11-11 NOTE — ED Notes (Signed)
PA [facility]edges aware of pt BP. Pt alert and oriented X4.

## 2017-11-12 ENCOUNTER — Encounter (HOSPITAL_COMMUNITY): Payer: Self-pay | Admitting: General Practice

## 2017-11-12 DIAGNOSIS — A419 Sepsis, unspecified organism: Secondary | ICD-10-CM

## 2017-11-12 DIAGNOSIS — M869 Osteomyelitis, unspecified: Secondary | ICD-10-CM | POA: Diagnosis present

## 2017-11-12 LAB — BASIC METABOLIC PANEL WITH GFR
Anion gap: 8 (ref 5–15)
BUN: 13 mg/dL (ref 6–20)
CO2: 22 mmol/L (ref 22–32)
Calcium: 8.3 mg/dL — ABNORMAL LOW (ref 8.9–10.3)
Chloride: 103 mmol/L (ref 101–111)
Creatinine, Ser: 1 mg/dL (ref 0.61–1.24)
GFR calc Af Amer: >60 mL/min
GFR calc non Af Amer: >60 mL/min
Glucose, Bld: 122 mg/dL — ABNORMAL HIGH (ref 65–99)
Potassium: 3.8 mmol/L (ref 3.5–5.1)
Sodium: 133 mmol/L — ABNORMAL LOW (ref 135–145)

## 2017-11-12 LAB — URINALYSIS, ROUTINE W REFLEX MICROSCOPIC
Bilirubin Urine: NEGATIVE
Glucose, UA: 150 mg/dL — AB
KETONES UR: NEGATIVE mg/dL
LEUKOCYTES UA: NEGATIVE
NITRITE: NEGATIVE
PH: 5 (ref 5.0–8.0)
Protein, ur: 30 mg/dL — AB
Specific Gravity, Urine: 1.017 (ref 1.005–1.030)
Squamous Epithelial / LPF: NONE SEEN

## 2017-11-12 LAB — GLUCOSE, CAPILLARY
GLUCOSE-CAPILLARY: 115 mg/dL — AB (ref 65–99)
GLUCOSE-CAPILLARY: 126 mg/dL — AB (ref 65–99)
GLUCOSE-CAPILLARY: 121 mg/dL — AB (ref 65–99)
Glucose-Capillary: 151 mg/dL — ABNORMAL HIGH (ref 65–99)

## 2017-11-12 LAB — PHOSPHORUS: PHOSPHORUS: 3 mg/dL (ref 2.5–4.6)

## 2017-11-12 LAB — CBC
HCT: 33.8 % — ABNORMAL LOW (ref 39.0–52.0)
Hemoglobin: 10.9 g/dL — ABNORMAL LOW (ref 13.0–17.0)
MCH: 30.4 pg (ref 26.0–34.0)
MCHC: 32.2 g/dL (ref 30.0–36.0)
MCV: 94.2 fL (ref 78.0–100.0)
Platelets: 215 K/uL (ref 150–400)
RBC: 3.59 MIL/uL — ABNORMAL LOW (ref 4.22–5.81)
RDW: 13.2 % (ref 11.5–15.5)
WBC: 10.7 K/uL — ABNORMAL HIGH (ref 4.0–10.5)

## 2017-11-12 LAB — PROCALCITONIN: Procalcitonin: 2.5 ng/mL

## 2017-11-12 LAB — MRSA PCR SCREENING: MRSA by PCR: NEGATIVE

## 2017-11-12 LAB — MAGNESIUM: Magnesium: 1.6 mg/dL — ABNORMAL LOW (ref 1.7–2.4)

## 2017-11-12 LAB — LACTIC ACID, PLASMA
Lactic Acid, Venous: 1.6 mmol/L (ref 0.5–1.9)
Lactic Acid, Venous: 1.9 mmol/L (ref 0.5–1.9)

## 2017-11-12 LAB — HIV ANTIBODY (ROUTINE TESTING W REFLEX): HIV SCREEN 4TH GENERATION: NONREACTIVE

## 2017-11-12 MED ORDER — ONDANSETRON HCL 4 MG/2ML IJ SOLN
4.0000 mg | Freq: Once | INTRAMUSCULAR | Status: AC
  Administered 2017-11-12: 4 mg via INTRAVENOUS
  Filled 2017-11-12: qty 2

## 2017-11-12 MED ORDER — MAGNESIUM SULFATE 2 GM/50ML IV SOLN
2.0000 g | Freq: Once | INTRAVENOUS | Status: AC
  Administered 2017-11-12: 2 g via INTRAVENOUS
  Filled 2017-11-12: qty 50

## 2017-11-12 MED ORDER — SODIUM CHLORIDE 0.9 % IV SOLN: 0.4000 ug/kg/h | INTRAVENOUS | Status: DC

## 2017-11-12 MED ORDER — POTASSIUM CHLORIDE 10 MEQ/100ML IV SOLN
10.0000 meq | INTRAVENOUS | Status: AC
  Administered 2017-11-12 (×2): 10 meq via INTRAVENOUS
  Filled 2017-11-12 (×4): qty 100

## 2017-11-12 MED ORDER — POTASSIUM CHLORIDE CRYS ER 20 MEQ PO TBCR
40.0000 meq | EXTENDED_RELEASE_TABLET | Freq: Once | ORAL | Status: AC
Start: 2017-11-12 — End: 2017-11-12
  Administered 2017-11-12: 40 meq via ORAL
  Filled 2017-11-12: qty 2

## 2017-11-12 MED ORDER — VANCOMYCIN HCL IN DEXTROSE 1-5 GM/200ML-% IV SOLN
1000.0000 mg | Freq: Three times a day (TID) | INTRAVENOUS | Status: DC
  Administered 2017-11-12 – 2017-11-13 (×3): 1000 mg via INTRAVENOUS
  Filled 2017-11-12 (×5): qty 200

## 2017-11-12 NOTE — Consult Note (Addendum)
[facility] Nurse wound consult note Reason for Consult: Consult requested for left foot topical treatment. Reviewed EMR notes; pt has a full thickness wound to toes which has osteomyelitis, according to the EMR. This complex medical condition is beyond the [facility] scope of practice. Dr Sharol Given of the ortho service has performed a consult and recommends surgery for amputation, which patient is refusing surgery. Topical treatment not be effective to promote healing.    Dressing procedure/placement/frequency: Xerform gauze to provide a drying agent and reduce adherence with dressing changes. Please refer to ortho team for further orders if patient changes his mind about having surgery.  Please re-consult if further assistance is needed.  Thank-you,  Julien Girt MSN, [place], [place], [place], [place]

## 2017-11-12 NOTE — Progress Notes (Signed)
Tech offered pt a bath and he stated he did not want to do that and he can do it himself if he changes his mind.

## 2017-11-12 NOTE — Progress Notes (Signed)
Name: SURYA SCHROETER MRN: [id number] DOB: 05/23/1971    ADMISSION DATE:  11/11/2017 CONSULTATION DATE:  11/11/17  REFERRING MD :  Venora Maples  CHIEF COMPLAINT:  Left foot ulcer   HISTORY OF PRESENT ILLNESS:  Johnny Martinez is a 47 y.o. male with a PMH as outlined below.  He presented to [facility] ED 11/11/17 after his PCP sent him there for imaging of his left foot which had plantar ulcer that had cultures return as MRSA.  Imaging in ED showed concern for acute or subacute osteo. Per pt, ulcer first appeared before Christmas.  He was started on abx (amoxillin first and later clinda).  Ulcer then got better; however, it ultimately presented again.  While in ED, he had hypotension that did respond somewhat to fluids; however, after 4L, he still had SBP in the low 90's.  PCCM subsequently called for admission. Pt denied recent fevers/chills/sweats, headaches, chest pain, dyspnea, V/D, abd pain, myalgias on admission.  He attested to nausea as well as decreased PO intake x 3 - 4 days.  In the ED, rectal temp was noted to be 104.  He was started on empiric vanc / zosyn.  REVIEW OF SYSTEMS:   ROS negative except as per subjective.   SUBJECTIVE:  Patient hungry today. Denied nausea currently, denied vomiting, fever, chills, HA, chest pain, foot pain, abdominal pain or discomfort, SOB, visual changes or other concerning symptoms.   VITAL SIGNS: Temp:  [97.9 F (36.6 C)-104.1 F (40.1 C)] 98.6 F (37 C) (01/11 0329) Pulse Rate:  [66-125] 91 (01/11 0600) Resp:  [16-24] 16 (01/11 0600) BP: (80-116)/(49-71) 105/59 (01/11 0600) SpO2:  [94 %-100 %] 97 % (01/11 0600) Weight:  [265 lb 6.9 oz (120.4 kg)-266 lb (120.7 kg)] 265 lb 6.9 oz (120.4 kg) (01/11 0500)  PHYSICAL EXAMINATION: General: patient appeared comfortable in bed this am, no acute distress, ambulating to restroom Neuro: A&O x 3, non-focal deficits noted.   HEENT: [place]/AT. PERRL, sclerae anicteric, EOM  Cardiovascular: RRR, no murmur appreciated on  ascultation  Lungs: Clear to ascultation bilaterally, no wheezing or rhonchi noted Abdomen: Normoactive bowel sounds auscultated. Nontender, no rebound Musculoskeletal: Left foot with plantar ulcer, no drainage, not notably improved.  Left foot slightly warm, edematous to knee.  Right foot with small plantar wound, no drainage. Bilateral dorsalis pedis pulses intact Skin: As above, warm, no rashes.  Recent Labs  Lab 11/11/17 1728 11/11/17 2230 11/12/17 0218  NA 128* 130* 133*  K 4.0 3.3* 3.8  CL 95* 102 103  CO2 23 20* 22  BUN 15 14 13   CREATININE 1.12 1.16 1.00  GLUCOSE 143* 174* 122*   Recent Labs  Lab 11/11/17 1728 11/12/17 0218  HGB 11.5* 10.9*  HCT 34.6* 33.8*  WBC 13.6* 10.7*  PLT 242 215   Dg Foot Complete Left  Result Date: 11/11/2017 CLINICAL DATA:  Left foot infection. Blood cultures positive for MRSA in left foot wound. EXAM: LEFT FOOT - COMPLETE 3+ VIEW COMPARISON:  None. FINDINGS: Diffuse soft tissue swelling of the included ankle and foot. Marked soft tissue swelling of the left second toe is also noted. Periosteal new bone formation is seen along the second proximal phalanx with bone destruction involving the neck of the second metatarsal. The second metatarsal also demonstrates periosteal new bone formation along the distal shaft. Findings are in keeping with changes of acute to subacute osteomyelitis. Erosive osteoarthritis of the second PIP joint is noted with gullwing configuration of the joint. No acute fracture  is noted. No joint dislocations. IMPRESSION: IMPRESSION 1. Periosteal new bone formation is seen about the left second ray involving the distal second metatarsal shaft and proximal phalanx. 2. Bone destruction involving the neck of the second metatarsal is identified with associated soft tissue swelling of the foot and second digit. The constellation of these findings would be in keeping with acute to subacute osteomyelitis. Electronically Signed   By: Ashley Royalty M.D.   On: 11/11/2017 19:13   STUDIES:  L foot Xray 1/10 > acute to subacute osteomyelitis.  CULTURES: Blood culture 1/11>>> not released   ANTIBIOTICS: Vancomycin 1/11>>> Zosyn 1/11>>>  SIGNIFICANT EVENTS  1/10 > admit with sepsis due to left foot diabetic ulcer (MRSA positive per pt).  ASSESSMENT / PLAN: PULMONARY A: SpO2 WNL's w/o intervention  P:   Continue to monitor  CARDIOVASCULAR A:  Hypotensive after fluid resuscitation  P:  Continue fluids  RENAL A:   Hyponatremia - improving but presumed hypovolemic from decreased PO intake. Hypocalcemia. Plan: P:   Continue NS @ 125. Follow BMP. Replace electrolytes as indicated   GASTROINTESTINAL A:   No indication for intervention P:   Ondansetron for nausea Carb modified diet  HEMATOLOGIC A:   Hgb low but stable at 10.9 P:  Monitor as indicated   INFECTIOUS A:   Sepsis - presumed due to left foot MRSA positive ulcer (per pt, cultures drawn at PCP office).  Was on amoxicillin and later clindamycin and ulcer did improve; however, never fully healed.  A repeat sepsis assessment has been performed.  Hypotension also likely in part due to hypovolemia given decreased to minimal PO intake x 3 days.   Lactate reassuring. Hx HTN. P: Continue supportive care. MIVF at 125cc/hr. Continue empiric vanc / zosyn and follow cultures. Holding preadmission lisinopril-HCTZ. Ortho consulted by EDP, will see in AM 1/11.  Dr. Sharol Given informed patient that source control was essential to prevent reoccurrence of his sepsis, patient refusing amputation at this time. Requested second opinion prior to surgery. Placed consult to On-call Physician for Orthopedics  ENDOCRINE A:   DM - not on meds. P:   SSI AC / HS.   NEUROLOGIC A:   Hx anxiety, ADD. Plan: P:   Hold preadmission adderall.  FAMILY  - Updates:   Best Practice Code Status:  Full. Diet:  Carb Mod. VTE prophylaxis:  Heparin / SCD's.  Admit to SDU for  tonight.  If remains stable and BP continues to respond, Transferring to [facility] Med[facility]Surg today.   Kathi Ludwig, MD

## 2017-11-12 NOTE — Progress Notes (Signed)
A/O x4, VS stable, report was given. [name] belongings with the pt. No distress noted.  Pt transferred to [id number]

## 2017-11-12 NOTE — Progress Notes (Signed)
Pharmacy Antibiotic Note  Johnny Martinez is a 47 y.o. male admitted on 11/11/2017 with osteomyelitis confirmed on foot xray on 1/10.  Pharmacy dosing vancomycin and Zosyn. WBC down to 10.7, afebrile, SCr stable at 1.   Plan: Increase vancomycin to 1000mg  IV q8h. Goal trough 15-20 mcg/mL. Continue Zosyn 3.375gm IV q8h (4 hour infusion) F/u renal fxn, trough @ SS, clinical resolution  Weight: 265 lb 6.9 oz (120.4 kg)  Temp (24hrs), Avg:100.4 F (38 C), Min:97.9 F (36.6 C), Max:104.1 F (40.1 C)  Recent Labs  Lab 11/11/17 1728 11/11/17 1755 11/11/17 2230 11/11/17 2313 11/12/17 0218  WBC 13.6*  --   --   --  10.7*  CREATININE 1.12  --  1.16  --  1.00  LATICACIDVEN  --  1.51  --  1.6 1.9    CrCl cannot be calculated (Unknown ideal weight.).    Allergies  Allergen Reactions  . Iodine Anaphylaxis  . Shellfish Allergy Anaphylaxis    Antimicrobials this admission: Vanc 1/10 >>  Zosyn 1/10 >>   Microbiology results: 1/10 BCx>> 1/11 MRSA PCR neg   Thank you for allowing pharmacy to be a part of this patient's care.  Albertina Parr, PharmD., BCPS Clinical Pharmacist Pager [phone]

## 2017-11-12 NOTE — Consult Note (Signed)
ORTHOPAEDIC CONSULTATION  REQUESTING PHYSICIAN: Hammonds, Sharyn Blitz, MD  Chief Complaint: Sausage digit swelling with ulceration left foot second toe.  HPI: Johnny Martinez is a 47 y.o. male who presents with insensate neuropathy with a 3-week history of swelling redness and ulceration left foot second toe.  Patient denies diabetes.  Patient presents with sepsis.  Past Medical History:  Diagnosis Date  . ADD (attention deficit disorder)    adult  . Anxiety and depression   . Diabetes mellitus without complication ([place])    pre  . GERD (gastroesophageal reflux disease)   . Hypertension   . Neuropathy   . Sleep apnea    History reviewed. No pertinent surgical history. Social History   Socioeconomic History  . Marital status: Married    Spouse name: Joelene Millin  . Number of children: 1  . Years of education: HS  . Highest education level: None  Social Needs  . Financial resource strain: None  . Food insecurity - worry: None  . Food insecurity - inability: None  . Transportation needs - medical: None  . Transportation needs - non-medical: None  Occupational History    Comment: [employer]  Tobacco Use  . Smoking status: Never Smoker  . Smokeless tobacco: Never Used  Substance and Sexual Activity  . Alcohol use: Yes    Comment: occasionally  . Drug use: None  . Sexual activity: None  Other Topics Concern  . None  Social History Narrative   Pt lives at home with his family.   Caffeine Use: 3 cups daily.    Family History  Problem Relation Age of Onset  . Cancer Mother   . Kidney failure Father   . Breast cancer Sister    - negative except otherwise stated in the family history section Allergies  Allergen Reactions  . Iodine Anaphylaxis  . Shellfish Allergy Anaphylaxis   Prior to Admission medications   Medication Sig Start Date End Date Taking? Authorizing Provider  amphetamine-dextroamphetamine (ADDERALL XR) 10 MG 24 hr capsule Take 10 mg by mouth daily.   Yes  [provider]  HYDROcodone-acetaminophen (NORCO/VICODIN) 5-325 MG tablet Take 1 tablet by mouth 2 (two) times daily as needed for moderate pain.   Yes [provider]  lisinopril-hydrochlorothiazide (PRINZIDE,ZESTORETIC) 20-12.5 MG per tablet Take 1 tablet by mouth daily.   Yes [provider]   Dg Foot Complete Left  Result Date: 11/11/2017 CLINICAL DATA:  Left foot infection. Blood cultures positive for MRSA in left foot wound. EXAM: LEFT FOOT - COMPLETE 3+ VIEW COMPARISON:  None. FINDINGS: Diffuse soft tissue swelling of the included ankle and foot. Marked soft tissue swelling of the left second toe is also noted. Periosteal new bone formation is seen along the second proximal phalanx with bone destruction involving the neck of the second metatarsal. The second metatarsal also demonstrates periosteal new bone formation along the distal shaft. Findings are in keeping with changes of acute to subacute osteomyelitis. Erosive osteoarthritis of the second PIP joint is noted with gullwing configuration of the joint. No acute fracture is noted. No joint dislocations. IMPRESSION: IMPRESSION 1. Periosteal new bone formation is seen about the left second ray involving the distal second metatarsal shaft and proximal phalanx. 2. Bone destruction involving the neck of the second metatarsal is identified with associated soft tissue swelling of the foot and second digit. The constellation of these findings would be in keeping with acute to subacute osteomyelitis. Electronically Signed   By: Ashley Royalty  M.D.   On: 11/11/2017 19:13   - pertinent xrays, CT, MRI studies were reviewed and independently interpreted  Positive ROS: All other systems have been reviewed and were otherwise negative with the exception of those mentioned in the HPI and as above.  Physical Exam: General: Alert, no acute distress Psychiatric: Patient is competent for consent with normal mood and affect Lymphatic: No  axillary or cervical lymphadenopathy Cardiovascular: No pedal edema Respiratory: No cyanosis, no use of accessory musculature GI: No organomegaly, abdomen is soft and non-tender  Skin: Examination patient has brawny edema in both legs with dermatitis.  He has pitting edema consistent with venous insufficiency he has good hair growth on the legs.  He has sausage digit swelling of the left foot second toe with ulceration and redness.   Neurologic: Patient does not have protective sensation bilateral lower extremities.   MUSCULOSKELETAL:  Patient has a good dorsalis pedis pulse bilaterally.  Radiographs shows destruction of the second metatarsal left foot consistent with chronic osteomyelitis.  Assessment: Assessment: Insensate neuropathy with osteomyelitis left foot second toe.  Patient denies having diabetes his blood sugars have run elevated during this hospitalization 122-174    Plan: Plan: Recommend the patient at minimum will require a left foot second ray amputation.  Patient states that he will not consider any type of surgery at this time.  I recommend calling the on-call orthopedic doctor for a second opinion today.  Would proceed with surgery once patient is in agreement.  Thank you for the consult and the opportunity to see Johnny Martinez, [place] [phone] 7:27 AM

## 2017-11-13 DIAGNOSIS — F988 Other specified behavioral and emotional disorders with onset usually occurring in childhood and adolescence: Secondary | ICD-10-CM

## 2017-11-13 DIAGNOSIS — R7303 Prediabetes: Secondary | ICD-10-CM

## 2017-11-13 DIAGNOSIS — M86272 Subacute osteomyelitis, left ankle and foot: Secondary | ICD-10-CM

## 2017-11-13 DIAGNOSIS — F419 Anxiety disorder, unspecified: Secondary | ICD-10-CM

## 2017-11-13 DIAGNOSIS — I10 Essential (primary) hypertension: Secondary | ICD-10-CM

## 2017-11-13 LAB — CBC
HCT: 28.4 % — ABNORMAL LOW (ref 39.0–52.0)
HEMOGLOBIN: 9.3 g/dL — AB (ref 13.0–17.0)
MCH: 30.5 pg (ref 26.0–34.0)
MCHC: 32.7 g/dL (ref 30.0–36.0)
MCV: 93.1 fL (ref 78.0–100.0)
PLATELETS: 216 10*3/uL (ref 150–400)
RBC: 3.05 MIL/uL — ABNORMAL LOW (ref 4.22–5.81)
RDW: 13.3 % (ref 11.5–15.5)
WBC: 7 10*3/uL (ref 4.0–10.5)

## 2017-11-13 LAB — GLUCOSE, CAPILLARY
GLUCOSE-CAPILLARY: 129 mg/dL — AB (ref 65–99)
Glucose-Capillary: 132 mg/dL — ABNORMAL HIGH (ref 65–99)

## 2017-11-13 MED ORDER — AMOXICILLIN-POT CLAVULANATE 875-125 MG PO TABS: 1.0000 | ORAL_TABLET | Freq: Two times a day (BID) | ORAL | 0 refills | Status: AC

## 2017-11-13 MED ORDER — DOXYCYCLINE HYCLATE 100 MG PO TABS: 100.0000 mg | ORAL_TABLET | Freq: Two times a day (BID) | ORAL | 0 refills | Status: AC

## 2017-11-13 NOTE — Consult Note (Signed)
ORTHOPAEDIC CONSULTATION  REQUESTING PHYSICIAN: Dessa Phi, DO  PCP:  Celene Squibb, MD  Chief Complaint: Left second toe plantar ulceration  HPI: Johnny Martinez is a 47 y.o. male who complains of Three-week history of left second toe ulceration.  Treated initially with oral antibiotics with clindamycin after superficial wound culture demonstrated MRSA.  Around about December 2060 had increasing erythema and return of infection.  He presented to the emergency department Thursday evening with fever and worsening infection.  He was admitted actually to the stepdown unit due to sepsis.  Dr. Meridee Score rendered an opinion and recommended second ray amputation of the left foot to control source of infection.  The patient was not excited about that prospect and has recommended a second opinion.  Currently the patient states that he was doing just fine with oral antibiotics and does not understand why he cannot describe back on those.  He feels okay at this time.  He would like to go home. He states that he has peripheral neuropathy in the feet, and is prediabetic but does not note any medications for diabetes.  Past Medical History:  Diagnosis Date  . ADD (attention deficit disorder)    adult  . Anxiety and depression   . Arthritis    "probably a touch in my hands" (11/12/2017)  . Diabetes mellitus without complication ([place])    pre  . GERD (gastroesophageal reflux disease)   . Hypertension   . Migraine    "maybe 2 in my lifetime" (11/12/2017)  . Neuropathy   . OSA on CPAP    Past Surgical History:  Procedure Laterality Date  . NO PAST SURGERIES     Social History   Socioeconomic History  . Marital status: Married    Spouse name: Joelene Millin  . Number of children: 1  . Years of education: HS  . Highest education level: None  Social Needs  . Financial resource strain: None  . Food insecurity - worry: None  . Food insecurity - inability: None  . Transportation needs - medical:  None  . Transportation needs - non-medical: None  Occupational History    Comment: [employer]  Tobacco Use  . Smoking status: Current Every Day Smoker    Packs/day: 0.40    Years: 33.00    Pack years: 13.20    Types: Cigarettes  . Smokeless tobacco: Never Used  Substance and Sexual Activity  . Alcohol use: Yes    Alcohol/week: 7.2 oz    Types: 12 Cans of beer per week  . Drug use: No  . Sexual activity: Yes  Other Topics Concern  . None  Social History Narrative   Pt lives at home with his family.   Caffeine Use: 3 cups daily.    Family History  Problem Relation Age of Onset  . Cancer Mother   . Kidney failure Father   . Breast cancer Sister    Allergies  Allergen Reactions  . Iodine Anaphylaxis  . Shellfish Allergy Anaphylaxis   Prior to Admission medications   Medication Sig Start Date End Date Taking? Authorizing Provider  amphetamine-dextroamphetamine (ADDERALL XR) 10 MG 24 hr capsule Take 10 mg by mouth daily.   Yes [provider]  HYDROcodone-acetaminophen (NORCO/VICODIN) 5-325 MG tablet Take 1 tablet by mouth 2 (two) times daily as needed for moderate pain.   Yes [provider]  lisinopril-hydrochlorothiazide (PRINZIDE,ZESTORETIC) 20-12.5 MG per tablet Take 1 tablet by mouth daily.   Yes [provider]  Dg Foot Complete Left  Result Date: 11/11/2017 CLINICAL DATA:  Left foot infection. Blood cultures positive for MRSA in left foot wound. EXAM: LEFT FOOT - COMPLETE 3+ VIEW COMPARISON:  None. FINDINGS: Diffuse soft tissue swelling of the included ankle and foot. Marked soft tissue swelling of the left second toe is also noted. Periosteal new bone formation is seen along the second proximal phalanx with bone destruction involving the neck of the second metatarsal. The second metatarsal also demonstrates periosteal new bone formation along the distal shaft. Findings are in keeping with changes of acute to subacute osteomyelitis. Erosive  osteoarthritis of the second PIP joint is noted with gullwing configuration of the joint. No acute fracture is noted. No joint dislocations. IMPRESSION: IMPRESSION 1. Periosteal new bone formation is seen about the left second ray involving the distal second metatarsal shaft and proximal phalanx. 2. Bone destruction involving the neck of the second metatarsal is identified with associated soft tissue swelling of the foot and second digit. The constellation of these findings would be in keeping with acute to subacute osteomyelitis. Electronically Signed   By: Ashley Royalty M.D.   On: 11/11/2017 19:13    Positive ROS: All other systems have been reviewed and were otherwise negative with the exception of those mentioned in the HPI and as above.  Physical Exam: General: Alert, no acute distress Cardiovascular: No pedal edema Respiratory: No cyanosis, no use of accessory musculature GI: No organomegaly, abdomen is soft and non-tender Skin: Woody induration with chronic venous stasis changes in bilateral lower extremity's. Neurologic: Sensation intact distally Psychiatric: Patient is competent for consent with normal mood and affect Lymphatic: Bilateral lower pitting edema to the mid tibia.  2+.  MUSCULOSKELETAL:  At the left foot he has sausage digits with swelling of the lesser and greater toe.  He has a plantar ulceration of the second toe.  He does not have protective sensation in bilateral feet.  He does have motor intact.  2+ dorsalis pedis pulse bilateral  Assessment: Chronic osteomyelitis of the left second ray with recent sepsis.  Plan: I agree with the recommendations left on the chart by Dr. Sharol Given.  I discussed at length with the patient that the goal is for source control and he has chronic osteomyelitis of the second ray that would preferentially be treated with surgery.  IV antibiotics or long-term oral antibiotics are certainly an option but less likely to achieve cure.  The patient at  this time is concerned because he has a vacation planned in 2 weeks and would like to potentially go on that vacation and then come back for a subacute ray resection.  I would defer to the medicine doctors to see if he is optimized medically and safe to do so.  I would certain also depend on Dr. Jess Barters surgical schedule.  I will sign off at this time.    Nicholes Stairs, MD Cell [phone]    11/13/2017 1:05 PM

## 2017-11-13 NOTE — Discharge Instructions (Signed)
Bone and Joint Infections, Adult Bone infections (osteomyelitis) and joint infections (septic arthritis) occur when bacteria or other germs get inside a bone or a joint. This can happen if you have an infection in another part of your body that spreads through your blood. Germs from your skin or from outside of your body can also cause this type of infection if you have a wound or a broken bone (fracture) that breaks the skin. Anyone can get a bone infection or joint infection. You may be more likely to get this type of infection if you have a condition, such as diabetes, that lowers your ability to fight infection or increases your chances of getting an infection. Bone and joint infections can cause damage, and they can spread to other areas of your body. They need to be treated quickly. What are the causes? Most bone and joint infections are caused by bacteria. They can also be caused by other germs, such as viruses and funguses. What increases the risk? This condition is more likely to develop in:  People who recently had surgery, especially bone or joint surgery.  People who have a long-term (chronic) disease, such as: ? HIV (human immunodeficiency virus). ? Diabetes. ? Rheumatoid arthritis. ? Sickle cell anemia.  Elderly people.  People who take medicines that block or weaken the body's defense system (immune system).  People who have a condition that reduces their blood flow.  People who are on kidney dialysis.  People who have an artificial joint.  People who have had a joint or bone repaired with plates or screws (surgical hardware).  People who use or abuse IV drugs.  People who have had trauma, such as stepping on a nail.  What are the signs or symptoms? Symptoms vary depending on the type and location of your infection. Common symptoms of bone and joint infections include:  Fever and chills.  Redness and warmth.  Swelling.  Pain and stiffness.  Drainage of fluid  or pus near the infection.  Weight loss and fatigue.  Decreased ability to use a hand or foot.  How is this diagnosed? This condition may be diagnosed based on symptoms, medical history, a physical exam, and diagnostic tests. Tests can help to identify the cause of the infection. You may have various tests, such as:  A sample of tissue, fluid, or blood taken to be examined under a microscope.  A procedure to remove fluid from the infected joint with a needle (joint aspiration) for testing in a lab.  Pus or discharge swabbed from a wound for testing to identify germs and to determine what type of medicine will kill them (culture and sensitivity).  Blood tests to look for evidence of infection and inflammation (biomarkers).  Imaging studies to determine how severe the bone or joint infection is. These may include: ? X-rays. ? CT scan. ? MRI. ? Bone scan.  How is this treated? Treatment depends on the cause and type of infection. Antibiotic medicines are usually the first treatment for a bone or joint infection. Treatment with antibiotics may include:  Getting IV antibiotics. This may be done in a hospital at first. You may have to continue IV antibiotics at home for several weeks. You may also have to take antibiotics by mouth for several weeks after that.  Taking more than one kind of antibiotic. Treatment may start with a type of antibiotic that works against many different bacteria (broad spectrumantibiotics). IV antibiotics may be changed if tests show that another   type may work better.  Other treatments may include:  Draining fluid from the joint by placing a needle into it (aspiration).  Surgery to remove: ? Dead or dying tissue from a bone or joint. ? An infected artificial joint. ? Infected plates or screws that were used to repair a broken bone.  Follow these instructions at home:  Take medicines only as directed by your health care provider.  Take your antibiotic  medicine as directed by your health care provider. Finish the antibiotic even if you start to feel better.  Follow instructions from your health care provider about how to take IV antibiotics at home.  Ask your health care provider if you have any restrictions on your activities.  Keep all follow-up visits as directed by your health care provider. This is important. Contact a health care provider if:  You have a fever or chills.  You have redness, warmth, pain, or swelling that returns after treatment. Get help right away if:  You have rapid breathing or you have trouble breathing.  You have chest pain.  You cannot drink fluids or make urine.  The affected arm or leg swells, changes color, or turns blue. This information is not intended to replace advice given to you by your health care provider. Make sure you discuss any questions you have with your health care provider. Document Released: 10/19/2005 Document Revised: 03/26/2016 Document Reviewed: 10/17/2014 Elsevier Interactive Patient Education  2018 [employer]  

## 2017-11-13 NOTE — Progress Notes (Signed)
Johnny Martinez to be D/C'd  per MD order. Discussed with the patient and all questions fully answered.  VSS, Skin clean, dry and intact without evidence of skin break down, no evidence of skin tears noted.  IV catheter discontinued intact. Site without signs and symptoms of complications. Dressing and pressure applied.  An After Visit Summary was printed and given to the patient. Patient received prescription.  D/c education completed with patient/family including follow up instructions, medication list, d/c activities limitations if indicated, with other d/c instructions as indicated by MD - patient able to verbalize understanding, all questions fully answered.   Patient instructed to return to ED, call 911, or call MD for any changes in condition.   Patient to be escorted via [place], and D/C home via private auto.

## 2017-11-13 NOTE — Plan of Care (Signed)
  Pain Managment: General experience of comfort will improve 11/13/2017 0541 - Progressing by Fransisco Hertz, RN   Clinical Measurements: Ability to maintain clinical measurements within normal limits will improve 11/13/2017 0541 - Progressing by Fransisco Hertz, RN   Education: Knowledge of General Education information will improve 11/13/2017 0541 - Progressing by Fransisco Hertz, RN

## 2017-11-13 NOTE — Discharge Summary (Signed)
Physician Discharge Summary  Johnny Martinez NIO:[id number] DOB: January 09, 1971 DOA: 11/11/2017  PCP: Celene Squibb, MD  Admit date: 11/11/2017 Discharge date: 11/13/2017  Admitted From: Home Disposition:  Home  Recommendations for Outpatient Follow-up:  1. Follow up with PCP in 1 week 2. Follow up with Dr. Sharol Given if you change your mind regarding surgery recommendations  3. Please obtain CBC in 1 week  4. Follow up on final blood cultures, negative at day of discharge   Discharge Condition: Stable CODE STATUS: Full  Diet recommendation: Heart healthy   Brief/Interim Summary: Johnny Martinez is a 47 yo male with past medical history significant for ADD, anxiety, pre-diabetes, GERD, HTN, neuropathy who presents to the emergency department after his primary care physician sent him for imaging of his left foot due to chronic plantar ulcer.  Wound was cultured at the primary care physician's office which returned as MRSA (culture report and sensitivity not available on our EMR system).  In the emergency department, imaging showed concern for acute or subacute osteomyelitis of left second ray involving distal second metatarsal shaft and proximal phalanx.  Patient stated that the ulcer started couple weeks prior to Christmas of 2018.  He had been on outpatient amoxicillin and then clindamycin with interval improvement but worsening off antibiotics.  In the emergency department, patient had low blood pressure and was admitted under PCCM services.  He was started on broad-spectrum antibiotics vancomycin and Zosyn.  He was given IV fluids for blood pressure management.  Orthopedic surgery Dr. Sharol Given was consulted.  Dr. Sharol Given recommended left second ray amputation which patient refused.  Dr. Stann Mainland with [facility] orthopedics was consulted as second opinion who also agreed with Dr. Jess Barters evaluation and recommendations.  Patient continued to refuse surgical intervention.  Infectious disease (curbside consult)  recommended prolonged oral antibiotic doxycycline and Augmentin for 4-6 weeks as outpatient.  Patient was recommended to follow-up with Dr. Sharol Given if he changes his mind regarding surgical intervention.  Discharge Diagnoses:  Principal Problem:   Osteomyelitis of left foot ([place]) Active Problems:   Peripheral neuropathy   Sepsis ([place])   Severe sepsis ([facility])   ADD (attention deficit disorder)   Anxiety   HTN (hypertension)   Pre-diabetes   Discharge Instructions  Discharge Instructions    Call MD for:  difficulty breathing, headache or visual disturbances   Complete by:  As directed    Call MD for:  extreme fatigue   Complete by:  As directed    Call MD for:  hives   Complete by:  As directed    Call MD for:  persistant dizziness or light-headedness   Complete by:  As directed    Call MD for:  persistant nausea and vomiting   Complete by:  As directed    Call MD for:  severe uncontrolled pain   Complete by:  As directed    Call MD for:  temperature >100.4   Complete by:  As directed    Diet - low sodium heart healthy   Complete by:  As directed    Discharge instructions   Complete by:  As directed    You were cared for by a hospitalist during your hospital stay. If you have any questions about your discharge medications or the care you received while you were in the hospital after you are discharged, you can call the unit and asked to speak with the hospitalist on call if the hospitalist that took care of you is not available.  Once you are discharged, your primary care physician will handle any further medical issues. Please note that NO REFILLS for any discharge medications will be authorized once you are discharged, as it is imperative that you return to your primary care physician (or establish a relationship with a primary care physician if you do not have one) for your aftercare needs so that they can reassess your need for medications and monitor your lab values.   Increase  activity slowly   Complete by:  As directed      Allergies as of 11/13/2017      Reactions   Iodine Anaphylaxis   Shellfish Allergy Anaphylaxis      Medication List    TAKE these medications   amoxicillin-clavulanate 875-125 MG tablet Commonly known as:  AUGMENTIN Take 1 tablet by mouth 2 (two) times daily.   amphetamine-dextroamphetamine 10 MG 24 hr capsule Commonly known as:  ADDERALL XR Take 10 mg by mouth daily.   doxycycline 100 MG tablet Commonly known as:  VIBRA-TABS Take 1 tablet (100 mg total) by mouth 2 (two) times daily.   HYDROcodone-acetaminophen 5-325 MG tablet Commonly known as:  NORCO/VICODIN Take 1 tablet by mouth 2 (two) times daily as needed for moderate pain.   lisinopril-hydrochlorothiazide 20-12.5 MG tablet Commonly known as:  PRINZIDE,ZESTORETIC Take 1 tablet by mouth daily.      Follow-up Information    Celene Squibb, MD. Schedule an appointment as soon as possible for a visit in 1 week(s).   Specialty:  Internal Medicine Contact information: [address] [place] [facility] [address] [phone]        Newt Minion, MD. Schedule an appointment as soon as possible for a visit.   Specialty:  Orthopedic Surgery Contact information: [address] [place] [address] [phone]          Allergies  Allergen Reactions  . Iodine Anaphylaxis  . Shellfish Allergy Anaphylaxis    Consultations:  Dr. Sharol Given  Dr. Stann Mainland (second opinion)  PCCM   Procedures/Studies: Dg Foot Complete Left  Result Date: 11/11/2017 CLINICAL DATA:  Left foot infection. Blood cultures positive for MRSA in left foot wound. EXAM: LEFT FOOT - COMPLETE 3+ VIEW COMPARISON:  None. FINDINGS: Diffuse soft tissue swelling of the included ankle and foot. Marked soft tissue swelling of the left second toe is also noted. Periosteal new bone formation is seen along the second proximal phalanx with bone destruction involving the neck of the second  metatarsal. The second metatarsal also demonstrates periosteal new bone formation along the distal shaft. Findings are in keeping with changes of acute to subacute osteomyelitis. Erosive osteoarthritis of the second PIP joint is noted with gullwing configuration of the joint. No acute fracture is noted. No joint dislocations. IMPRESSION: IMPRESSION 1. Periosteal new bone formation is seen about the left second ray involving the distal second metatarsal shaft and proximal phalanx. 2. Bone destruction involving the neck of the second metatarsal is identified with associated soft tissue swelling of the foot and second digit. The constellation of these findings would be in keeping with acute to subacute osteomyelitis. Electronically Signed   By: Ashley Royalty M.D.   On: 11/11/2017 19:13       Discharge Exam: Vitals:   11/12/17 2126 11/13/17 0517  BP: 117/73 119/72  Pulse: 77 68  Resp: 18 18  Temp: 98.5 F (36.9 C) 98.4 F (36.9 C)  SpO2: 100% 97%   Vitals:   11/12/17 1215 11/12/17 1314 11/12/17  2126 11/13/17 0517  BP:  121/78 117/73 119/72  Pulse:  74 77 68  Resp:  20 18 18   Temp: 98.3 F (36.8 C) 98.4 F (36.9 C) 98.5 F (36.9 C) 98.4 F (36.9 C)  TempSrc: Oral Oral Oral Oral  SpO2:  100% 100% 97%  Weight:    127 kg (279 lb 15.8 oz)    General: Pt is alert, awake, not in acute distress Cardiovascular: RRR, S1/S2 +, no rubs, no gallops Respiratory: CTA bilaterally, no wheezing, no rhonchi Abdominal: Soft, NT, ND, bowel sounds + Extremities: no edema, no cyanosis Skin: left foot with dry and clean dressing     The results of significant diagnostics from this hospitalization (including imaging, microbiology, ancillary and laboratory) are listed below for reference.     Microbiology: Recent Results (from the past 240 hour(s))  Culture, blood (Routine x 2)     Status: None (Preliminary result)   Collection Time: 11/11/17  5:28 PM  Result Value Ref Range Status   Specimen  Description BLOOD LEFT ANTECUBITAL  Final   Special Requests   Final    Blood Culture adequate volume BOTTLES DRAWN AEROBIC AND ANAEROBIC   Culture NO GROWTH 2 DAYS  Final   Report Status PENDING  Incomplete  Culture, blood (Routine x 2)     Status: None (Preliminary result)   Collection Time: 11/11/17  5:45 PM  Result Value Ref Range Status   Specimen Description BLOOD LEFT ANTECUBITAL  Final   Special Requests   Final    Blood Culture adequate volume BOTTLES DRAWN AEROBIC ONLY   Culture NO GROWTH 2 DAYS  Final   Report Status PENDING  Incomplete  MRSA PCR Screening     Status: None   Collection Time: 11/12/17  6:34 AM  Result Value Ref Range Status   MRSA by PCR NEGATIVE NEGATIVE Final    Comment:        The GeneXpert MRSA Assay (FDA approved for NASAL specimens only), is one component of a comprehensive MRSA colonization surveillance program. It is not intended to diagnose MRSA infection nor to guide or monitor treatment for MRSA infections.      Labs: BNP (last 3 results) No results for input(s): BNP in the last 8760 hours. Basic Metabolic Panel: Recent Labs  Lab 11/11/17 1728 11/11/17 2230 11/12/17 0218  NA 128* 130* 133*  K 4.0 3.3* 3.8  CL 95* 102 103  CO2 23 20* 22  GLUCOSE 143* 174* 122*  BUN 15 14 13   CREATININE 1.12 1.16 1.00  CALCIUM 8.8* 7.1* 8.3*  MG  --   --  1.6*  PHOS  --   --  3.0   Liver Function Tests: Recent Labs  Lab 11/11/17 1728  AST 24  ALT 18  ALKPHOS 76  BILITOT 0.9  PROT 7.7  ALBUMIN 3.5   No results for input(s): LIPASE, AMYLASE in the last 168 hours. No results for input(s): AMMONIA in the last 168 hours. CBC: Recent Labs  Lab 11/11/17 1728 11/12/17 0218 11/13/17 0423  WBC 13.6* 10.7* 7.0  NEUTROABS 11.7*  --   --   HGB 11.5* 10.9* 9.3*  HCT 34.6* 33.8* 28.4*  MCV 93.0 94.2 93.1  PLT 242 215 216   Cardiac Enzymes: No results for input(s): CKTOTAL, CKMB, CKMBINDEX, TROPONINI in the last 168 hours. BNP: Invalid  input(s): POCBNP CBG: Recent Labs  Lab 11/12/17 1213 11/12/17 1807 11/12/17 2124 11/13/17 0739 11/13/17 1154  GLUCAP 115* 126* 121* 129* 132*  D-Dimer No results for input(s): DDIMER in the last 72 hours. Hgb A1c No results for input(s): HGBA1C in the last 72 hours. Lipid Profile No results for input(s): CHOL, HDL, LDLCALC, TRIG, CHOLHDL, LDLDIRECT in the last 72 hours. Thyroid function studies No results for input(s): TSH, T4TOTAL, T3FREE, THYROIDAB in the last 72 hours.  Invalid input(s): FREET3 Anemia work up No results for input(s): VITAMINB12, FOLATE, FERRITIN, TIBC, IRON, RETICCTPCT in the last 72 hours. Urinalysis    Component Value Date/Time   COLORURINE YELLOW 11/11/2017 0013   APPEARANCEUR HAZY (A) 11/11/2017 0013   LABSPEC 1.017 11/11/2017 0013   PHURINE 5.0 11/11/2017 0013   GLUCOSEU 150 (A) 11/11/2017 0013   HGBUR LARGE (A) 11/11/2017 0013   BILIRUBINUR NEGATIVE 11/11/2017 0013   KETONESUR NEGATIVE 11/11/2017 0013   PROTEINUR 30 (A) 11/11/2017 0013   NITRITE NEGATIVE 11/11/2017 0013   LEUKOCYTESUR NEGATIVE 11/11/2017 0013   Sepsis Labs Invalid input(s): PROCALCITONIN,  WBC,  LACTICIDVEN Microbiology Recent Results (from the past 240 hour(s))  Culture, blood (Routine x 2)     Status: None (Preliminary result)   Collection Time: 11/11/17  5:28 PM  Result Value Ref Range Status   Specimen Description BLOOD LEFT ANTECUBITAL  Final   Special Requests   Final    Blood Culture adequate volume BOTTLES DRAWN AEROBIC AND ANAEROBIC   Culture NO GROWTH 2 DAYS  Final   Report Status PENDING  Incomplete  Culture, blood (Routine x 2)     Status: None (Preliminary result)   Collection Time: 11/11/17  5:45 PM  Result Value Ref Range Status   Specimen Description BLOOD LEFT ANTECUBITAL  Final   Special Requests   Final    Blood Culture adequate volume BOTTLES DRAWN AEROBIC ONLY   Culture NO GROWTH 2 DAYS  Final   Report Status PENDING  Incomplete  MRSA PCR  Screening     Status: None   Collection Time: 11/12/17  6:34 AM  Result Value Ref Range Status   MRSA by PCR NEGATIVE NEGATIVE Final    Comment:        The GeneXpert MRSA Assay (FDA approved for NASAL specimens only), is one component of a comprehensive MRSA colonization surveillance program. It is not intended to diagnose MRSA infection nor to guide or monitor treatment for MRSA infections.      Time coordinating discharge: 40 minutes  SIGNED:  Dessa Phi, DO [facility] Pager [phone]  If 7PM-7AM, please contact night-coverage [email or web address] Password [facility] 11/13/2017, 2:39 PM

## 2017-11-16 LAB — CULTURE, BLOOD (ROUTINE X 2)
Culture: NO GROWTH
Culture: NO GROWTH
Special Requests: ADEQUATE
Special Requests: ADEQUATE

## 2017-11-19 DIAGNOSIS — Z712 Person consulting for explanation of examination or test findings: Secondary | ICD-10-CM | POA: Diagnosis not present

## 2017-11-19 DIAGNOSIS — Z0001 Encounter for general adult medical examination with abnormal findings: Secondary | ICD-10-CM | POA: Diagnosis not present

## 2017-11-19 DIAGNOSIS — F909 Attention-deficit hyperactivity disorder, unspecified type: Secondary | ICD-10-CM | POA: Diagnosis not present

## 2017-11-19 DIAGNOSIS — L02612 Cutaneous abscess of left foot: Secondary | ICD-10-CM | POA: Diagnosis not present

## 2017-11-19 DIAGNOSIS — E875 Hyperkalemia: Secondary | ICD-10-CM | POA: Diagnosis not present

## 2017-11-19 DIAGNOSIS — R7301 Impaired fasting glucose: Secondary | ICD-10-CM | POA: Diagnosis not present

## 2017-11-19 DIAGNOSIS — M86171 Other acute osteomyelitis, right ankle and foot: Secondary | ICD-10-CM | POA: Diagnosis not present

## 2017-11-19 DIAGNOSIS — R600 Localized edema: Secondary | ICD-10-CM | POA: Diagnosis not present

## 2017-12-01 ENCOUNTER — Ambulatory Visit (INDEPENDENT_AMBULATORY_CARE_PROVIDER_SITE_OTHER): Payer: BLUE CROSS/BLUE SHIELD | Admitting: Family

## 2017-12-01 ENCOUNTER — Encounter (INDEPENDENT_AMBULATORY_CARE_PROVIDER_SITE_OTHER): Payer: Self-pay | Admitting: Family

## 2017-12-01 DIAGNOSIS — E11610 Type 2 diabetes mellitus with diabetic neuropathic arthropathy: Secondary | ICD-10-CM | POA: Diagnosis not present

## 2017-12-01 DIAGNOSIS — M86272 Subacute osteomyelitis, left ankle and foot: Secondary | ICD-10-CM

## 2017-12-01 NOTE — Progress Notes (Signed)
Office Visit Note   Patient: Johnny Martinez           Date of Birth: [birth year]           MRN: [id number] Visit Date: 12/01/2017              Requested by: Celene Squibb, MD [place], [place] [address] PCP: Celene Squibb, MD  No chief complaint on file.     HPI: Patient is a 47 year old gentleman seen today for evaluation of left foot ulceration with osteomyelitis to second MT. Has had difficulty with ulcers to bilateral feet for years. Denies DM, does have neuropathy.  States has been to wound center in past for several months. Did stop going. Has been doing triple antibiotic ointment dressing changes daily. Taking augmentin and doxycycline following hospital discharge, was admitted for sepsis earlier this month. Feels ok, states thinks feeling better due to abx. Is nervous to come of abx. States is ready to discuss surgical options.  Was seen by Dr Sharol Given and Stann Mainland as second opinion. Both recommended 2nd ray amputation. Radiographs done in hospital showed osteo of 2nd mt distally.  Assessment & Plan: Visit Diagnoses:  1. Diabetic neurogenic arthropathy ([place])   2. Subacute osteomyelitis of left foot ([place])     Plan: has ulceration to left 1st MT head, this is chronic. Concerned for further osteo not seen on plain films. Will proceed with MRI of left foot prior to surgery to rule out further osteomyelitis. Continue oral antibiotics. Continue daily wound care. Do use felt to relieve pressure from ulcerative areas. Heel cord stretching taught and demonstrated. Will call patient with MRI results and update plan.  Follow-Up Instructions: No Follow-up on file.   Ortho Exam  Patient is alert, oriented, no adenopathy, well-dressed, normal affect, normal respiratory effort. On examination of left foot does have strong DP pulse. Sausage digit swelling of second toe. Callus beneath the 2nd MT head. Has open ulcer beneath 1st MT head. This is 25 mm in diameter, was pared with a  10 blade knife back to viable tissue, is 3 mm deep. Silver nitrate used for hemostasis. No odor. No purulence. Does not probe.   Imaging: No results found. No images are attached to the encounter.  Labs: Lab Results  Component Value Date   ESRSEDRATE 9 04/14/2013   CRP 2.4 04/14/2013   REPTSTATUS 11/16/2017 FINAL 11/11/2017   CULT NO GROWTH 5 DAYS 11/11/2017    @LABSALLVALUES (HGBA1)@  There is no height or weight on file to calculate BMI.  Orders:  Orders Placed This Encounter  Procedures  . MR Foot Left w/o contrast   No orders of the defined types were placed in this encounter.    Procedures: No procedures performed  Clinical Data: No additional findings.  ROS:  All other systems negative, except as noted in the HPI. Review of Systems  Constitutional: Negative for chills and fever.  Musculoskeletal: Positive for joint swelling and myalgias.  Skin: Positive for wound. Negative for color change.    Objective: Vital Signs: There were no vitals taken for this visit.  Specialty Comments:  No specialty comments available.  PMFS History: Patient Active Problem List   Diagnosis Date Noted  . ADD (attention deficit disorder) 11/13/2017  . Anxiety 11/13/2017  . HTN (hypertension) 11/13/2017  . Pre-diabetes 11/13/2017  . Osteomyelitis of left foot ([place])   . Sepsis ([place]) 11/11/2017  . Severe sepsis ([place]) 11/11/2017  . Peripheral  neuropathy 04/14/2013   Past Medical History:  Diagnosis Date  . ADD (attention deficit disorder)    adult  . Anxiety and depression   . Arthritis    "probably a touch in my hands" (11/12/2017)  . Diabetes mellitus without complication ([place])    pre  . GERD (gastroesophageal reflux disease)   . Hypertension   . Migraine    "maybe 2 in my lifetime" (11/12/2017)  . Neuropathy   . OSA on CPAP     Family History  Problem Relation Age of Onset  . Cancer Mother   . Kidney failure Father   . Breast cancer Sister     Past Surgical  History:  Procedure Laterality Date  . NO PAST SURGERIES     Social History   Occupational History    Comment: [employer]  Tobacco Use  . Smoking status: Current Every Day Smoker    Packs/day: 0.40    Years: 33.00    Pack years: 13.20    Types: Cigarettes  . Smokeless tobacco: Never Used  Substance and Sexual Activity  . Alcohol use: Yes    Alcohol/week: 7.2 oz    Types: 12 Cans of beer per week  . Drug use: No  . Sexual activity: Yes

## 2017-12-07 ENCOUNTER — Ambulatory Visit (HOSPITAL_COMMUNITY)
Admission: RE | Admit: 2017-12-07 | Discharge: 2017-12-07 | Disposition: A | Discharge status: Home / Self Care | Payer: BLUE CROSS/BLUE SHIELD | Source: Ambulatory Visit | Attending: Family | Admitting: Family

## 2017-12-07 DIAGNOSIS — M84475A Pathological fracture, left foot, initial encounter for fracture: Secondary | ICD-10-CM | POA: Insufficient documentation

## 2017-12-07 DIAGNOSIS — M86272 Subacute osteomyelitis, left ankle and foot: Secondary | ICD-10-CM | POA: Diagnosis not present

## 2017-12-07 DIAGNOSIS — R600 Localized edema: Secondary | ICD-10-CM | POA: Insufficient documentation

## 2017-12-13 ENCOUNTER — Ambulatory Visit (INDEPENDENT_AMBULATORY_CARE_PROVIDER_SITE_OTHER): Payer: BLUE CROSS/BLUE SHIELD | Admitting: Orthopedic Surgery

## 2017-12-13 ENCOUNTER — Encounter (INDEPENDENT_AMBULATORY_CARE_PROVIDER_SITE_OTHER): Payer: Self-pay | Admitting: Orthopedic Surgery

## 2017-12-13 ENCOUNTER — Other Ambulatory Visit (INDEPENDENT_AMBULATORY_CARE_PROVIDER_SITE_OTHER): Payer: Self-pay | Admitting: Family

## 2017-12-13 VITALS — Ht 74.0 in | Wt 279.0 lb

## 2017-12-13 DIAGNOSIS — M86272 Subacute osteomyelitis, left ankle and foot: Secondary | ICD-10-CM

## 2017-12-13 DIAGNOSIS — E11610 Type 2 diabetes mellitus with diabetic neuropathic arthropathy: Secondary | ICD-10-CM

## 2017-12-13 NOTE — Progress Notes (Signed)
Office Visit Note   Patient: Johnny Martinez           Date of Birth: 07-Aug-1971           MRN: [id number] Visit Date: 12/13/2017              Requested by: Celene Squibb, MD [place], [place] [address] PCP: Celene Squibb, MD  Chief Complaint  Patient presents with  . Left Foot - Follow-up    MRI review      HPI: Patient is a 47 year old gentleman with diabetic insensate neuropathy who has had a chronic ulcer beneath the first metatarsal head he is also had a ulcer beneath the second metatarsal head he has undergone treatment at the wound center with out resolution of his symptoms.  Assessment & Plan: Visit Diagnoses:  1. Subacute osteomyelitis of left foot ([place])   2. Diabetic neurogenic arthropathy ([place])     Plan: We will plan for a second ray amputation.  Risks and benefits were discussed including risk of the wound not healing.  Patient states he understands wished to proceed at this time he will need compression stockings for his venous insufficiency and will need Achilles stretching to unload pressure from the metatarsal heads.  Follow-Up Instructions: Return in about 2 weeks (around 12/27/2017).   Ortho Exam  Patient is alert, oriented, no adenopathy, well-dressed, normal affect, normal respiratory effort. Examination patient has a normal gait he has a strong dorsalis pedis pulse.  He has sausage digit swelling of the second toe with a healed ulcer beneath the second metatarsal head.  There is no ascending cellulitis.  Reviewed the MRI scan shows increased edema within the second metatarsal and the second toe consistent with osteomyelitis he does have some increased edema in the third and fourth metatarsals but this clinically appears to be in due to overloading from his Achilles tightness.  There are no clinical signs of infection in the third or fourth rays.  Imaging: No results found. No images are attached to the encounter.  Labs: Lab Results    Component Value Date   ESRSEDRATE 9 04/14/2013   CRP 2.4 04/14/2013   REPTSTATUS 11/16/2017 FINAL 11/11/2017   CULT NO GROWTH 5 DAYS 11/11/2017    @LABSALLVALUES (HGBA1)@  Body mass index is 35.82 kg/m.  Orders:  No orders of the defined types were placed in this encounter.  No orders of the defined types were placed in this encounter.    Procedures: No procedures performed  Clinical Data: No additional findings.  ROS:  All other systems negative, except as noted in the HPI. Review of Systems  Objective: Vital Signs: Ht 6\' 2"  (1.88 m)   Wt 279 lb (126.6 kg)   BMI 35.82 kg/m   Specialty Comments:  No specialty comments available.  PMFS History: Patient Active Problem List   Diagnosis Date Noted  . ADD (attention deficit disorder) 11/13/2017  . Anxiety 11/13/2017  . HTN (hypertension) 11/13/2017  . Pre-diabetes 11/13/2017  . Osteomyelitis of left foot ([place])   . Sepsis ([place]) 11/11/2017  . Severe sepsis ([place]) 11/11/2017  . Peripheral neuropathy 04/14/2013   Past Medical History:  Diagnosis Date  . ADD (attention deficit disorder)    adult  . Anxiety and depression   . Arthritis    "probably a touch in my hands" (11/12/2017)  . Diabetes mellitus without complication ([place])    pre  . GERD (gastroesophageal reflux disease)   . Hypertension   .  Migraine    "maybe 2 in my lifetime" (11/12/2017)  . Neuropathy   . OSA on CPAP     Family History  Problem Relation Age of Onset  . Cancer Mother   . Kidney failure Father   . Breast cancer Sister     Past Surgical History:  Procedure Laterality Date  . NO PAST SURGERIES     Social History   Occupational History    Comment: [employer]  Tobacco Use  . Smoking status: Current Every Day Smoker    Packs/day: 0.40    Years: 33.00    Pack years: 13.20    Types: Cigarettes  . Smokeless tobacco: Never Used  Substance and Sexual Activity  . Alcohol use: Yes    Alcohol/week: 7.2 oz    Types: 12 Cans of beer per  week  . Drug use: No  . Sexual activity: Yes

## 2017-12-16 ENCOUNTER — Other Ambulatory Visit: Payer: Self-pay

## 2017-12-16 ENCOUNTER — Encounter (HOSPITAL_COMMUNITY): Payer: Self-pay | Admitting: *Deleted

## 2017-12-16 MED ORDER — DEXTROSE 5 % IV SOLN
3.0000 g | INTRAVENOUS | Status: AC
  Administered 2017-12-17: 3 g via INTRAVENOUS
  Filled 2017-12-16: qty 3000

## 2017-12-16 NOTE — Progress Notes (Addendum)
Spoke with pt for pre-op call. Pt denies cardiac history, chest pain or sob. Pt states he is "pre-pre" diabetic. States he does not check his blood sugar at home. Thinks his last A1C was 5.0 3 months ago. Will call Dr. Earnest Conroy. Hall's office to get most recent result.  Dr. Juel Burrow office stated that pt's most recent A1C was 6.4 on 09/18/17

## 2017-12-17 ENCOUNTER — Ambulatory Visit (HOSPITAL_COMMUNITY): Payer: BLUE CROSS/BLUE SHIELD | Admitting: Anesthesiology

## 2017-12-17 ENCOUNTER — Encounter (HOSPITAL_COMMUNITY): Payer: Self-pay

## 2017-12-17 ENCOUNTER — Encounter (HOSPITAL_COMMUNITY)
Admission: RE | Disposition: A | Discharge status: Home / Self Care | Payer: Self-pay | Source: Ambulatory Visit | Attending: Orthopedic Surgery

## 2017-12-17 ENCOUNTER — Ambulatory Visit (HOSPITAL_COMMUNITY)
Admission: RE | Admit: 2017-12-17 | Discharge: 2017-12-17 | Disposition: A | Discharge status: Home / Self Care | Payer: BLUE CROSS/BLUE SHIELD | Source: Ambulatory Visit | Attending: Orthopedic Surgery | Admitting: Orthopedic Surgery

## 2017-12-17 DIAGNOSIS — F17210 Nicotine dependence, cigarettes, uncomplicated: Secondary | ICD-10-CM | POA: Diagnosis not present

## 2017-12-17 DIAGNOSIS — E1169 Type 2 diabetes mellitus with other specified complication: Secondary | ICD-10-CM | POA: Diagnosis not present

## 2017-12-17 DIAGNOSIS — F329 Major depressive disorder, single episode, unspecified: Secondary | ICD-10-CM | POA: Diagnosis not present

## 2017-12-17 DIAGNOSIS — F909 Attention-deficit hyperactivity disorder, unspecified type: Secondary | ICD-10-CM | POA: Diagnosis not present

## 2017-12-17 DIAGNOSIS — Z888 Allergy status to other drugs, medicaments and biological substances status: Secondary | ICD-10-CM | POA: Insufficient documentation

## 2017-12-17 DIAGNOSIS — G2581 Restless legs syndrome: Secondary | ICD-10-CM | POA: Diagnosis not present

## 2017-12-17 DIAGNOSIS — M86272 Subacute osteomyelitis, left ankle and foot: Secondary | ICD-10-CM | POA: Diagnosis not present

## 2017-12-17 DIAGNOSIS — E11621 Type 2 diabetes mellitus with foot ulcer: Secondary | ICD-10-CM | POA: Insufficient documentation

## 2017-12-17 DIAGNOSIS — E11610 Type 2 diabetes mellitus with diabetic neuropathic arthropathy: Secondary | ICD-10-CM | POA: Insufficient documentation

## 2017-12-17 DIAGNOSIS — F419 Anxiety disorder, unspecified: Secondary | ICD-10-CM | POA: Insufficient documentation

## 2017-12-17 DIAGNOSIS — Z79899 Other long term (current) drug therapy: Secondary | ICD-10-CM | POA: Insufficient documentation

## 2017-12-17 DIAGNOSIS — K219 Gastro-esophageal reflux disease without esophagitis: Secondary | ICD-10-CM | POA: Diagnosis not present

## 2017-12-17 DIAGNOSIS — G4733 Obstructive sleep apnea (adult) (pediatric): Secondary | ICD-10-CM | POA: Diagnosis not present

## 2017-12-17 DIAGNOSIS — L97529 Non-pressure chronic ulcer of other part of left foot with unspecified severity: Secondary | ICD-10-CM | POA: Diagnosis not present

## 2017-12-17 DIAGNOSIS — I10 Essential (primary) hypertension: Secondary | ICD-10-CM | POA: Insufficient documentation

## 2017-12-17 DIAGNOSIS — M869 Osteomyelitis, unspecified: Secondary | ICD-10-CM | POA: Diagnosis not present

## 2017-12-17 HISTORY — DX: Prediabetes: R73.03

## 2017-12-17 HISTORY — DX: Methicillin resistant Staphylococcus aureus infection, unspecified site: A49.02

## 2017-12-17 HISTORY — PX: AMPUTATION: SHX166

## 2017-12-17 HISTORY — DX: Restless legs syndrome: G25.81

## 2017-12-17 LAB — CBC
HCT: 39.2 % (ref 39.0–52.0)
HEMOGLOBIN: 13 g/dL (ref 13.0–17.0)
MCH: 30.9 pg (ref 26.0–34.0)
MCHC: 33.2 g/dL (ref 30.0–36.0)
MCV: 93.1 fL (ref 78.0–100.0)
Platelets: 283 10*3/uL (ref 150–400)
RBC: 4.21 MIL/uL — ABNORMAL LOW (ref 4.22–5.81)
RDW: 14.3 % (ref 11.5–15.5)
WBC: 11.9 10*3/uL — ABNORMAL HIGH (ref 4.0–10.5)

## 2017-12-17 LAB — HEMOGLOBIN A1C
Hgb A1c MFr Bld: 6.6 % — ABNORMAL HIGH (ref 4.8–5.6)
MEAN PLASMA GLUCOSE: 142.72 mg/dL

## 2017-12-17 LAB — BASIC METABOLIC PANEL
ANION GAP: 14 (ref 5–15)
BUN: 13 mg/dL (ref 6–20)
CALCIUM: 9.3 mg/dL (ref 8.9–10.3)
CO2: 22 mmol/L (ref 22–32)
Chloride: 102 mmol/L (ref 101–111)
Creatinine, Ser: 0.73 mg/dL (ref 0.61–1.24)
GFR calc Af Amer: >60 mL/min (ref >60)
GLUCOSE: 118 mg/dL — AB (ref 65–99)
Potassium: 3.9 mmol/L (ref 3.5–5.1)
SODIUM: 138 mmol/L (ref 135–145)

## 2017-12-17 LAB — GLUCOSE, CAPILLARY: Glucose-Capillary: 113 mg/dL — ABNORMAL HIGH (ref 65–99)

## 2017-12-17 SURGERY — AMPUTATION, FOOT, RAY
Anesthesia: General | Laterality: Left

## 2017-12-17 MED ORDER — ONDANSETRON HCL 4 MG/2ML IJ SOLN
INTRAMUSCULAR | Status: DC | PRN
  Administered 2017-12-17: 4 mg via INTRAVENOUS

## 2017-12-17 MED ORDER — FENTANYL CITRATE (PF) 100 MCG/2ML IJ SOLN: 25.0000 ug | INTRAMUSCULAR | Status: DC | PRN

## 2017-12-17 MED ORDER — ONDANSETRON HCL 4 MG/2ML IJ SOLN
INTRAMUSCULAR | Status: AC
  Filled 2017-12-17: qty 2

## 2017-12-17 MED ORDER — METOCLOPRAMIDE HCL 5 MG/ML IJ SOLN: 10.0000 mg | Freq: Once | INTRAMUSCULAR | Status: DC | PRN

## 2017-12-17 MED ORDER — CHLORHEXIDINE GLUCONATE 4 % EX LIQD: 60.0000 mL | Freq: Once | CUTANEOUS | Status: DC

## 2017-12-17 MED ORDER — HYDROCODONE-ACETAMINOPHEN 5-325 MG PO TABS: 1.0000 | ORAL_TABLET | ORAL | 0 refills | Status: DC | PRN

## 2017-12-17 MED ORDER — LACTATED RINGERS IV SOLN
INTRAVENOUS | Status: DC | PRN
  Administered 2017-12-17: 07:00:00 via INTRAVENOUS

## 2017-12-17 MED ORDER — MEPERIDINE HCL 50 MG/ML IJ SOLN: 6.2500 mg | INTRAMUSCULAR | Status: DC | PRN

## 2017-12-17 MED ORDER — BUPIVACAINE HCL (PF) 0.25 % IJ SOLN
INTRAMUSCULAR | Status: AC
  Filled 2017-12-17: qty 30

## 2017-12-17 MED ORDER — PROPOFOL 10 MG/ML IV BOLUS
INTRAVENOUS | Status: AC
  Filled 2017-12-17: qty 20

## 2017-12-17 MED ORDER — FENTANYL CITRATE (PF) 100 MCG/2ML IJ SOLN
INTRAMUSCULAR | Status: DC | PRN
  Administered 2017-12-17 (×2): 50 ug via INTRAVENOUS

## 2017-12-17 MED ORDER — 0.9 % SODIUM CHLORIDE (POUR BTL) OPTIME
TOPICAL | Status: DC | PRN
  Administered 2017-12-17: 1000 mL

## 2017-12-17 MED ORDER — MIDAZOLAM HCL 2 MG/2ML IJ SOLN
INTRAMUSCULAR | Status: AC
  Filled 2017-12-17: qty 2

## 2017-12-17 MED ORDER — BUPIVACAINE HCL (PF) 0.25 % IJ SOLN
INTRAMUSCULAR | Status: DC | PRN
  Administered 2017-12-17: 16 mL

## 2017-12-17 MED ORDER — FENTANYL CITRATE (PF) 250 MCG/5ML IJ SOLN
INTRAMUSCULAR | Status: AC
  Filled 2017-12-17: qty 5

## 2017-12-17 MED ORDER — LACTATED RINGERS IV SOLN: INTRAVENOUS | Status: DC

## 2017-12-17 MED ORDER — PROPOFOL 10 MG/ML IV BOLUS
INTRAVENOUS | Status: DC | PRN
  Administered 2017-12-17: 200 mg via INTRAVENOUS

## 2017-12-17 MED ORDER — MIDAZOLAM HCL 5 MG/5ML IJ SOLN
INTRAMUSCULAR | Status: DC | PRN
  Administered 2017-12-17: 2 mg via INTRAVENOUS

## 2017-12-17 MED ORDER — LIDOCAINE 2% (20 MG/ML) 5 ML SYRINGE
INTRAMUSCULAR | Status: DC | PRN
  Administered 2017-12-17: 100 mg via INTRAVENOUS

## 2017-12-17 MED ORDER — HYDROCODONE-ACETAMINOPHEN 5-325 MG PO TABS: 0.5000 | ORAL_TABLET | Freq: Every day | ORAL | 0 refills | Status: DC | PRN

## 2017-12-17 MED ORDER — LIDOCAINE 2% (20 MG/ML) 5 ML SYRINGE
INTRAMUSCULAR | Status: AC
  Filled 2017-12-17: qty 5

## 2017-12-17 SURGICAL SUPPLY — 31 items
BLADE SAW SGTL MED 73X18.5 STR (BLADE) ×3 IMPLANT
BLADE SURG 21 STRL SS (BLADE) ×3 IMPLANT
BNDG COHESIVE 4X5 TAN STRL (GAUZE/BANDAGES/DRESSINGS) ×3 IMPLANT
BNDG GAUZE ELAST 4 BULKY (GAUZE/BANDAGES/DRESSINGS) ×6 IMPLANT
COVER SURGICAL LIGHT HANDLE (MISCELLANEOUS) ×6 IMPLANT
DRAPE U-SHAPE 47X51 STRL (DRAPES) ×6 IMPLANT
DRSG ADAPTIC 3X8 NADH LF (GAUZE/BANDAGES/DRESSINGS) ×3 IMPLANT
DRSG PAD ABDOMINAL 8X10 ST (GAUZE/BANDAGES/DRESSINGS) ×6 IMPLANT
DURAPREP 26ML APPLICATOR (WOUND CARE) ×3 IMPLANT
ELECT REM PT RETURN 9FT ADLT (ELECTROSURGICAL) ×3
ELECTRODE REM PT RTRN 9FT ADLT (ELECTROSURGICAL) ×1 IMPLANT
GAUZE SPONGE 4X4 12PLY STRL (GAUZE/BANDAGES/DRESSINGS) ×3 IMPLANT
GLOVE BIOGEL PI IND STRL 9 (GLOVE) ×1 IMPLANT
GLOVE BIOGEL PI INDICATOR 9 (GLOVE) ×2
GLOVE SURG ORTHO 9.0 STRL STRW (GLOVE) ×3 IMPLANT
GOWN STRL REUS W/ TWL LRG LVL3 (GOWN DISPOSABLE) ×2 IMPLANT
GOWN STRL REUS W/ TWL XL LVL3 (GOWN DISPOSABLE) ×2 IMPLANT
GOWN STRL REUS W/TWL LRG LVL3 (GOWN DISPOSABLE) ×4
GOWN STRL REUS W/TWL XL LVL3 (GOWN DISPOSABLE) ×4
KIT BASIN OR (CUSTOM PROCEDURE TRAY) ×3 IMPLANT
KIT ROOM TURNOVER OR (KITS) ×3 IMPLANT
NS IRRIG 1000ML POUR BTL (IV SOLUTION) ×3 IMPLANT
PACK ORTHO EXTREMITY (CUSTOM PROCEDURE TRAY) ×3 IMPLANT
PAD ARMBOARD 7.5X6 YLW CONV (MISCELLANEOUS) ×6 IMPLANT
STOCKINETTE IMPERVIOUS LG (DRAPES) IMPLANT
SUT ETHILON 2 0 PSLX (SUTURE) ×3 IMPLANT
SYR CONTROL 10ML LL (SYRINGE) ×3 IMPLANT
TOWEL OR 17X26 10 PK STRL BLUE (TOWEL DISPOSABLE) ×3 IMPLANT
TUBE CONNECTING 12'X1/4 (SUCTIONS) ×1
TUBE CONNECTING 12X1/4 (SUCTIONS) ×2 IMPLANT
YANKAUER SUCT BULB TIP NO VENT (SUCTIONS) ×3 IMPLANT

## 2017-12-17 NOTE — Progress Notes (Signed)
Orthopedic Tech Progress Note Patient Details:  Johnny Martinez 01/26/1971 [id number]  Ortho Devices Type of Ortho Device: Postop shoe/boot Ortho Device/Splint Interventions: Application   Post Interventions Patient Tolerated: Well Instructions Provided: Care of device   Johnny Martinez 12/17/2017, 8:48 AM

## 2017-12-17 NOTE — Transfer of Care (Signed)
Immediate Anesthesia Transfer of Care Note  Patient: Johnny Martinez  Procedure(s) Performed: LEFT 2ND RAY AMPUTATION (Left )  Patient Location: PACU  Anesthesia Type:General  Level of Consciousness: awake, alert  and oriented  Airway & Oxygen Therapy: Patient Spontanous Breathing and Patient connected to face mask oxygen  Post-op Assessment: Report given to RN and Post -op Vital signs reviewed and stable  Post vital signs: Reviewed and stable  Last Vitals:  Vitals:   12/17/17 0637 12/17/17 0802  BP: (!) 139/91   Pulse: 90   Resp: 20   Temp: 36.7 C (P) 36.4 C  SpO2: 98%     Last Pain:  Vitals:   12/17/17 0802  TempSrc:   PainSc: (P) 0-No pain         Complications: No apparent anesthesia complications

## 2017-12-17 NOTE — Anesthesia Preprocedure Evaluation (Signed)
Anesthesia Evaluation  Patient identified by MRN, date of birth, ID band Patient awake    Reviewed: Allergy & Precautions, NPO status , Patient's Chart, lab work & pertinent test results  Airway Mallampati: II  TM Distance: >3 FB Neck ROM: Full    Dental no notable dental hx.    Pulmonary sleep apnea , Current Smoker,    Pulmonary exam normal breath sounds clear to auscultation       Cardiovascular hypertension, Pt. on medications Normal cardiovascular exam Rhythm:Regular Rate:Normal     Neuro/Psych neuropathy negative psych ROS   GI/Hepatic negative GI ROS, Neg liver ROS,   Endo/Other  negative endocrine ROS  Renal/GU negative Renal ROS  negative genitourinary   Musculoskeletal negative musculoskeletal ROS (+)   Abdominal   Peds negative pediatric ROS (+)  Hematology negative hematology ROS (+)   Anesthesia Other Findings   Reproductive/Obstetrics negative OB ROS                             Anesthesia Physical Anesthesia Plan  ASA: III  Anesthesia Plan: General   Post-op Pain Management:    Induction: Intravenous  PONV Risk Score and Plan: 1 and Ondansetron and Treatment may vary due to age or medical condition  Airway Management Planned: LMA  Additional Equipment:   Intra-op Plan:   Post-operative Plan: Extubation in OR  Informed Consent: I have reviewed the patients History and Physical, chart, labs and discussed the procedure including the risks, benefits and alternatives for the proposed anesthesia with the patient or authorized representative who has indicated his/her understanding and acceptance.   Dental advisory given  Plan Discussed with: CRNA  Anesthesia Plan Comments:         Anesthesia Quick Evaluation

## 2017-12-17 NOTE — Anesthesia Postprocedure Evaluation (Signed)
Anesthesia Post Note  Patient: Johnny Martinez  Procedure(s) Performed: LEFT 2ND RAY AMPUTATION (Left )     Patient location during evaluation: PACU Anesthesia Type: General Level of consciousness: awake and alert Pain management: pain level controlled Vital Signs Assessment: post-procedure vital signs reviewed and stable Respiratory status: spontaneous breathing, nonlabored ventilation, respiratory function stable and patient connected to nasal cannula oxygen Cardiovascular status: blood pressure returned to baseline and stable Postop Assessment: no apparent nausea or vomiting Anesthetic complications: no    Last Vitals:  Vitals:   12/17/17 0845 12/17/17 0900  BP: (!) 145/93 (!) 141/95  Pulse: 87 81  Resp: (!) 25 20  Temp: 36.5 C 36.5 C  SpO2: 97% 100%    Last Pain:  Vitals:   12/17/17 0900  TempSrc:   PainSc: 0-No pain                 [name]

## 2017-12-17 NOTE — H&P (Signed)
Johnny Martinez is an 47 y.o. male.   Chief Complaint: Chronic ulceration and osteomyelitis second metatarsal head left foot HPI: Patient is a 47 year old gentleman with diabetic insensate neuropathy who has had a chronic ulcer beneath the first metatarsal head he is also had a ulcer beneath the second metatarsal head he has undergone treatment at the wound center with out resolution of his symptoms.    Past Medical History:  Diagnosis Date  . ADD (attention deficit disorder)    adult  . ADD (attention deficit disorder)   . Anxiety and depression   . Arthritis    "probably a touch in my hands" (11/12/2017)  . GERD (gastroesophageal reflux disease)   . Hypertension   . Migraine    "maybe 2 in my lifetime" (11/12/2017)  . MRSA infection (methicillin-resistant Staphylococcus aureus)   . Neuropathy   . OSA on CPAP    uses cpap  . Pre-diabetes   . Restless legs     Past Surgical History:  Procedure Laterality Date  . NO PAST SURGERIES    . WISDOM TOOTH EXTRACTION      Family History  Problem Relation Age of Onset  . Cancer Mother   . Kidney failure Father   . Breast cancer Sister    Social History:  reports that he has been smoking cigarettes.  He has a 13.20 pack-year smoking history. he has never used smokeless tobacco. He reports that he drinks about 7.2 oz of alcohol per week. He reports that he does not use drugs.  Allergies:  Allergies  Allergen Reactions  . Iodine Anaphylaxis  . Shellfish Allergy Anaphylaxis    Medications Prior to Admission  Medication Sig Dispense Refill  . amoxicillin-clavulanate (AUGMENTIN) 875-125 MG tablet Take 1 tablet by mouth 2 (two) times daily. 84 tablet 0  . amphetamine-dextroamphetamine (ADDERALL XR) 10 MG 24 hr capsule Take 10 mg by mouth daily.    Marland Kitchen doxycycline (VIBRA-TABS) 100 MG tablet Take 1 tablet (100 mg total) by mouth 2 (two) times daily. 84 tablet 0  . HYDROcodone-acetaminophen (NORCO/VICODIN) 5-325 MG tablet Take 0.5 tablets  by mouth daily as needed for moderate pain.     Marland Kitchen lisinopril-hydrochlorothiazide (PRINZIDE,ZESTORETIC) 20-12.5 MG per tablet Take 1 tablet by mouth daily.      Results for orders placed or performed during the hospital encounter of 12/17/17 (from the past 48 hour(s))  Glucose, capillary     Status: Abnormal   Collection Time: 12/17/17  6:34 AM  Result Value Ref Range   Glucose-Capillary 113 (H) 65 - 99 mg/dL   Comment 1 Notify RN    Comment 2 Document in Chart    No results found.  Review of Systems  All other systems reviewed and are negative.   Blood pressure (!) 139/91, pulse 90, temperature 98.1 F (36.7 C), temperature source Oral, resp. rate 20, height 6\' 2"  (1.88 m), weight 279 lb (126.6 kg), SpO2 98 %. Physical Exam  Patient is alert, oriented, no adenopathy, well-dressed, normal affect, normal respiratory effort. Examination patient has a normal gait he has a strong dorsalis pedis pulse.  He has sausage digit swelling of the second toe with a healed ulcer beneath the second metatarsal head.  There is no ascending cellulitis.  Reviewed the MRI scan shows increased edema within the second metatarsal and the second toe consistent with osteomyelitis he does have some increased edema in the third and fourth metatarsals but this clinically appears to be in due to overloading from his  Achilles tightness.  There are no clinical signs of infection in the third or fourth rays.   Assessment/Plan 1. Subacute osteomyelitis of left foot ([place])   2. Diabetic neurogenic arthropathy ([place])     Plan: We will plan for a second ray amputation.  Risks and benefits were discussed including risk of the wound not healing.  Patient states he understands wished to proceed at this time he will need compression stockings for his venous insufficiency and will need Achilles stretching to unload pressure from the metatarsal heads.     Newt Minion, MD 12/17/2017, 6:54 AM

## 2017-12-17 NOTE — Anesthesia Procedure Notes (Signed)
Procedure Name: LMA Insertion Date/Time: 12/17/2017 7:34 AM Performed by: Babs Bertin, CRNA Pre-anesthesia Checklist: Patient identified, Emergency Drugs available, Suction available and Patient being monitored Patient Re-evaluated:Patient Re-evaluated prior to induction Oxygen Delivery Method: Circle System Utilized Preoxygenation: Pre-oxygenation with 100% oxygen Induction Type: IV induction Ventilation: Mask ventilation without difficulty LMA: LMA inserted LMA Size: 4.0 Number of attempts: 1 Airway Equipment and Method: Bite block Placement Confirmation: positive ETCO2 Tube secured with: Tape Dental Injury: Teeth and Oropharynx as per pre-operative assessment

## 2017-12-17 NOTE — Op Note (Signed)
12/17/2017  8:02 AM  PATIENT:  Johnny Martinez    PRE-OPERATIVE DIAGNOSIS:  Osteomyelitis Left 2nd Toe  POST-OPERATIVE DIAGNOSIS:  Same  PROCEDURE:  LEFT 2ND RAY AMPUTATION  SURGEON:  Newt Minion, MD  PHYSICIAN ASSISTANT:None ANESTHESIA:   General  PREOPERATIVE INDICATIONS:  Johnny Martinez is a  47 y.o. male with a diagnosis of Osteomyelitis Left 2nd Toe who failed conservative measures and elected for surgical management.    The risks benefits and alternatives were discussed with the patient preoperatively including but not limited to the risks of infection, bleeding, nerve injury, cardiopulmonary complications, the need for revision surgery, among others, and the patient was willing to proceed.  OPERATIVE IMPLANTS: None  OPERATIVE FINDINGS: No abscess at the level of amputation.  OPERATIVE PROCEDURE: Patient was brought to the operating room and underwent a general anesthetic.  After adequate levels of anesthesia were obtained patient's left lower extremity was prepped using DuraPrep draped into a sterile field a timeout was called.  A V incision was made over the second metatarsal head.  The metatarsal was resected through midshaft of the toe and the metatarsal were resected in one block of tissue.  Electrocautery was used for hemostasis.  The wound was irrigated with normal saline.  The wound was closed using 2-0 nylon.  A sterile compressive dressing was applied.  Patient was extubated taken the PACU in stable condition.   DISCHARGE PLANNING:  Antibiotic duration: Perioperative  Weightbearing: Nonweightbearing left lower extremity  Pain medication: Prescription for Vicodin  Dressing care/ Wound VAC: Follow-up in 1 week to change dressing  Ambulatory devices: Kneeling scooter  Discharge to: Home  Follow-up: In the office 1 week post operative.

## 2017-12-18 ENCOUNTER — Encounter (HOSPITAL_COMMUNITY): Payer: Self-pay | Admitting: Orthopedic Surgery

## 2017-12-19 LAB — GLUCOSE, CAPILLARY: GLUCOSE-CAPILLARY: 125 mg/dL — AB (ref 65–99)

## 2017-12-27 ENCOUNTER — Ambulatory Visit (INDEPENDENT_AMBULATORY_CARE_PROVIDER_SITE_OTHER): Payer: BLUE CROSS/BLUE SHIELD | Admitting: Orthopedic Surgery

## 2017-12-27 ENCOUNTER — Encounter (INDEPENDENT_AMBULATORY_CARE_PROVIDER_SITE_OTHER): Payer: Self-pay | Admitting: Orthopedic Surgery

## 2017-12-27 VITALS — Ht 74.0 in | Wt 279.0 lb

## 2017-12-27 DIAGNOSIS — Z89422 Acquired absence of other left toe(s): Secondary | ICD-10-CM

## 2017-12-27 NOTE — Progress Notes (Signed)
Office Visit Note   Patient: Johnny Martinez           Date of Birth: Jan 07, 1971           MRN: [id number] Visit Date: 12/27/2017              Requested by: Celene Squibb, MD [place], [place] [address] PCP: Celene Squibb, MD  Chief Complaint  Patient presents with  . Left Foot - Routine Post Op    12/17/17  left 2nd ray amputation       HPI: Patient is a 47 year old gentleman who presents 10 days status post left foot second ray amputation.  Patient denies any problems at this time.  Assessment & Plan: Visit Diagnoses:  1. History of partial ray amputation of second toe of left foot ([place])     Plan: Patient will begin lensing with soap and water using gauze and a Band-Aid continue protected weightbearing with crutches.  Follow-up in 1 week to harvest the sutures.  Follow-Up Instructions: Return in about 1 week (around 01/03/2018).   Ortho Exam  Patient is alert, oriented, no adenopathy, well-dressed, normal affect, normal respiratory effort. Examination the incision is healing nicely there is no cellulitis no odor no drainage no signs of infection.  Imaging: No results found. No images are attached to the encounter.  Labs: Lab Results  Component Value Date   HGBA1C 6.6 (H) 12/17/2017   ESRSEDRATE 9 04/14/2013   CRP 2.4 04/14/2013   REPTSTATUS 11/16/2017 FINAL 11/11/2017   CULT NO GROWTH 5 DAYS 11/11/2017    @LABSALLVALUES (HGBA1)@  Body mass index is 35.82 kg/m.  Orders:  No orders of the defined types were placed in this encounter.  No orders of the defined types were placed in this encounter.    Procedures: No procedures performed  Clinical Data: No additional findings.  ROS:  All other systems negative, except as noted in the HPI. Review of Systems  Objective: Vital Signs: Ht 6\' 2"  (1.88 m)   Wt 279 lb (126.6 kg)   BMI 35.82 kg/m   Specialty Comments:  No specialty comments available.  PMFS History: Patient Active Problem  List   Diagnosis Date Noted  . ADD (attention deficit disorder) 11/13/2017  . Anxiety 11/13/2017  . HTN (hypertension) 11/13/2017  . Pre-diabetes 11/13/2017  . Osteomyelitis of left foot ([place])   . Sepsis ([place]) 11/11/2017  . Severe sepsis ([place]) 11/11/2017  . Peripheral neuropathy 04/14/2013   Past Medical History:  Diagnosis Date  . ADD (attention deficit disorder)    adult  . ADD (attention deficit disorder)   . Anxiety and depression   . Arthritis    "probably a touch in my hands" (11/12/2017)  . GERD (gastroesophageal reflux disease)   . Hypertension   . Migraine    "maybe 2 in my lifetime" (11/12/2017)  . MRSA infection (methicillin-resistant Staphylococcus aureus)   . Neuropathy   . OSA on CPAP    uses cpap  . Pre-diabetes   . Restless legs     Family History  Problem Relation Age of Onset  . Cancer Mother   . Kidney failure Father   . Breast cancer Sister     Past Surgical History:  Procedure Laterality Date  . AMPUTATION Left 12/17/2017   Procedure: LEFT 2ND RAY AMPUTATION;  Surgeon: Newt Minion, MD;  Location: [facility];  Service: Orthopedics;  Laterality: Left;  . NO PAST SURGERIES    . WISDOM  TOOTH EXTRACTION     Social History   Occupational History    Comment: [employer]  Tobacco Use  . Smoking status: Current Every Day Smoker    Packs/day: 0.40    Years: 33.00    Pack years: 13.20    Types: Cigarettes  . Smokeless tobacco: Never Used  Substance and Sexual Activity  . Alcohol use: Yes    Alcohol/week: 7.2 oz    Types: 12 Cans of beer per week  . Drug use: No  . Sexual activity: Yes

## 2018-01-03 ENCOUNTER — Ambulatory Visit (INDEPENDENT_AMBULATORY_CARE_PROVIDER_SITE_OTHER): Payer: BLUE CROSS/BLUE SHIELD | Admitting: Orthopedic Surgery

## 2018-01-03 ENCOUNTER — Encounter (INDEPENDENT_AMBULATORY_CARE_PROVIDER_SITE_OTHER): Payer: Self-pay | Admitting: Orthopedic Surgery

## 2018-01-03 VITALS — Ht 74.0 in | Wt 279.0 lb

## 2018-01-03 DIAGNOSIS — Z89422 Acquired absence of other left toe(s): Secondary | ICD-10-CM

## 2018-01-03 NOTE — Progress Notes (Signed)
Office Visit Note   Patient: Johnny Martinez           Date of Birth: 09-11-1971           MRN: [id number] Visit Date: 01/03/2018              Requested by: Celene Squibb, MD [place], [place] [address] PCP: Celene Squibb, MD  Chief Complaint  Patient presents with  . Left Foot - Routine Post Op    12/17/17 left foot 2nd ray amputation       HPI: Patient presents in follow-up status post left foot second ray amputation he has no complaints.  Assessment & Plan: Visit Diagnoses:  1. History of partial ray amputation of second toe of left foot ([place])     Plan: Advance to [place] sneakers scar massage with cocoa butter he may discontinue the cane advance his activities return to work as he feels comfortable.  Follow-Up Instructions: Return in about 4 weeks (around 01/31/2018).   Ortho Exam  Patient is alert, oriented, no adenopathy, well-dressed, normal affect, normal respiratory effort. Examination the incision is well-healed he does have some swelling there is no redness no drainage no signs of infection.  The wound edges are well epithelialized.  Imaging: No results found. No images are attached to the encounter.  Labs: Lab Results  Component Value Date   HGBA1C 6.6 (H) 12/17/2017   ESRSEDRATE 9 04/14/2013   CRP 2.4 04/14/2013   REPTSTATUS 11/16/2017 FINAL 11/11/2017   CULT NO GROWTH 5 DAYS 11/11/2017    @LABSALLVALUES (HGBA1)@  Body mass index is 35.82 kg/m.  Orders:  No orders of the defined types were placed in this encounter.  No orders of the defined types were placed in this encounter.    Procedures: No procedures performed  Clinical Data: No additional findings.  ROS:  All other systems negative, except as noted in the HPI. Review of Systems  Objective: Vital Signs: Ht 6\' 2"  (1.88 m)   Wt 279 lb (126.6 kg)   BMI 35.82 kg/m   Specialty Comments:  No specialty comments available.  PMFS History: Patient Active Problem List   Diagnosis Date Noted  . ADD (attention deficit disorder) 11/13/2017  . Anxiety 11/13/2017  . HTN (hypertension) 11/13/2017  . Pre-diabetes 11/13/2017  . Osteomyelitis of left foot ([place])   . Sepsis ([place]) 11/11/2017  . Severe sepsis ([place]) 11/11/2017  . Peripheral neuropathy 04/14/2013   Past Medical History:  Diagnosis Date  . ADD (attention deficit disorder)    adult  . ADD (attention deficit disorder)   . Anxiety and depression   . Arthritis    "probably a touch in my hands" (11/12/2017)  . GERD (gastroesophageal reflux disease)   . Hypertension   . Migraine    "maybe 2 in my lifetime" (11/12/2017)  . MRSA infection (methicillin-resistant Staphylococcus aureus)   . Neuropathy   . OSA on CPAP    uses cpap  . Pre-diabetes   . Restless legs     Family History  Problem Relation Age of Onset  . Cancer Mother   . Kidney failure Father   . Breast cancer Sister     Past Surgical History:  Procedure Laterality Date  . AMPUTATION Left 12/17/2017   Procedure: LEFT 2ND RAY AMPUTATION;  Surgeon: Newt Minion, MD;  Location: [facility];  Service: Orthopedics;  Laterality: Left;  . NO PAST SURGERIES    . WISDOM TOOTH EXTRACTION  Social History   Occupational History    Comment: [employer]  Tobacco Use  . Smoking status: Current Every Day Smoker    Packs/day: 0.40    Years: 33.00    Pack years: 13.20    Types: Cigarettes  . Smokeless tobacco: Never Used  Substance and Sexual Activity  . Alcohol use: Yes    Alcohol/week: 7.2 oz    Types: 12 Cans of beer per week  . Drug use: No  . Sexual activity: Yes

## 2018-01-13 DIAGNOSIS — R600 Localized edema: Secondary | ICD-10-CM | POA: Diagnosis not present

## 2018-01-13 DIAGNOSIS — I10 Essential (primary) hypertension: Secondary | ICD-10-CM | POA: Diagnosis not present

## 2018-01-13 DIAGNOSIS — Z6836 Body mass index (BMI) 36.0-36.9, adult: Secondary | ICD-10-CM | POA: Diagnosis not present

## 2018-01-13 DIAGNOSIS — Z89422 Acquired absence of other left toe(s): Secondary | ICD-10-CM | POA: Diagnosis not present

## 2018-01-31 ENCOUNTER — Ambulatory Visit (INDEPENDENT_AMBULATORY_CARE_PROVIDER_SITE_OTHER): Payer: BLUE CROSS/BLUE SHIELD | Admitting: Orthopedic Surgery

## 2018-01-31 ENCOUNTER — Encounter (INDEPENDENT_AMBULATORY_CARE_PROVIDER_SITE_OTHER): Payer: Self-pay | Admitting: Orthopedic Surgery

## 2018-01-31 VITALS — Ht 74.0 in | Wt 279.0 lb

## 2018-01-31 DIAGNOSIS — Z89422 Acquired absence of other left toe(s): Secondary | ICD-10-CM

## 2018-02-01 ENCOUNTER — Encounter (INDEPENDENT_AMBULATORY_CARE_PROVIDER_SITE_OTHER): Payer: Self-pay | Admitting: Orthopedic Surgery

## 2018-02-01 NOTE — Progress Notes (Signed)
Office Visit Note   Patient: Johnny Martinez           Date of Birth: [birth year]           MRN: [id number] Visit Date: 01/31/2018              Requested by: Celene Squibb, MD [address] [place], [place] [address] PCP: Celene Squibb, MD  Chief Complaint  Patient presents with  . Left Foot - Routine Post Op    12/17/17 left 2nd ray amputation       HPI: Patient presents in follow-up status post left foot second ray amputation.  He has no complaints at this time.  Assessment & Plan: Visit Diagnoses:  1. History of partial ray amputation of second toe of left foot ([place])     Plan: Patient will increase his activities as tolerated follow-up as needed.  Follow-Up Instructions: Return if symptoms worsen or fail to improve.   Ortho Exam  Patient is alert, oriented, no adenopathy, well-dressed, normal affect, normal respiratory effort. Examination the incision is well-healed he was given instructions to work on heel cord stretching.  He will also work on scar massage.  Imaging: No results found. No images are attached to the encounter.  Labs: Lab Results  Component Value Date   HGBA1C 6.6 (H) 12/17/2017   ESRSEDRATE 9 04/14/2013   CRP 2.4 04/14/2013   REPTSTATUS 11/16/2017 FINAL 11/11/2017   CULT NO GROWTH 5 DAYS 11/11/2017    @LABSALLVALUES (HGBA1)@  Body mass index is 35.82 kg/m.  Orders:  No orders of the defined types were placed in this encounter.  No orders of the defined types were placed in this encounter.    Procedures: No procedures performed  Clinical Data: No additional findings.  ROS:  All other systems negative, except as noted in the HPI. Review of Systems  Objective: Vital Signs: Ht 6\' 2"  (1.88 m)   Wt 279 lb (126.6 kg)   BMI 35.82 kg/m   Specialty Comments:  No specialty comments available.  PMFS History: Patient Active Problem List   Diagnosis Date Noted  . ADD (attention deficit disorder) 11/13/2017  . Anxiety 11/13/2017   . HTN (hypertension) 11/13/2017  . Pre-diabetes 11/13/2017  . Osteomyelitis of left foot ([place])   . Sepsis ([place]) 11/11/2017  . Severe sepsis ([place]) 11/11/2017  . Peripheral neuropathy 04/14/2013   Past Medical History:  Diagnosis Date  . ADD (attention deficit disorder)    adult  . ADD (attention deficit disorder)   . Anxiety and depression   . Arthritis    "probably a touch in my hands" (11/12/2017)  . GERD (gastroesophageal reflux disease)   . Hypertension   . Migraine    "maybe 2 in my lifetime" (11/12/2017)  . MRSA infection (methicillin-resistant Staphylococcus aureus)   . Neuropathy   . OSA on CPAP    uses cpap  . Pre-diabetes   . Restless legs     Family History  Problem Relation Age of Onset  . Cancer Mother   . Kidney failure Father   . Breast cancer Sister     Past Surgical History:  Procedure Laterality Date  . AMPUTATION Left 12/17/2017   Procedure: LEFT 2ND RAY AMPUTATION;  Surgeon: Newt Minion, MD;  Location: [facility];  Service: Orthopedics;  Laterality: Left;  . NO PAST SURGERIES    . WISDOM TOOTH EXTRACTION     Social History   Occupational History    Comment: [employer]  Tobacco Use  . Smoking status: Current Every Day Smoker    Packs/day: 0.40    Years: 33.00    Pack years: 13.20    Types: Cigarettes  . Smokeless tobacco: Never Used  Substance and Sexual Activity  . Alcohol use: Yes    Alcohol/week: 7.2 oz    Types: 12 Cans of beer per week  . Drug use: No  . Sexual activity: Yes

## 2018-02-21 ENCOUNTER — Encounter (INDEPENDENT_AMBULATORY_CARE_PROVIDER_SITE_OTHER): Payer: Self-pay | Admitting: Orthopedic Surgery

## 2018-02-21 ENCOUNTER — Ambulatory Visit (INDEPENDENT_AMBULATORY_CARE_PROVIDER_SITE_OTHER): Payer: BLUE CROSS/BLUE SHIELD | Admitting: Orthopedic Surgery

## 2018-02-21 DIAGNOSIS — Z89422 Acquired absence of other left toe(s): Secondary | ICD-10-CM

## 2018-02-21 DIAGNOSIS — E11610 Type 2 diabetes mellitus with diabetic neuropathic arthropathy: Secondary | ICD-10-CM

## 2018-02-21 NOTE — Progress Notes (Signed)
Office Visit Note   Patient: Johnny Martinez           Date of Birth: 1971/09/09           MRN: [id number] Visit Date: 02/21/2018              Requested by: Celene Squibb, MD [place], [place] [address] PCP: Celene Squibb, MD  Chief Complaint  Patient presents with  . Left Foot - Follow-up      HPI: Patient is a 47 year old gentleman diabetic insensate neuropathy heel cord contracture status post second ray amputation left foot he is about 9 weeks out from surgery and presents with persistent of pain beneath the first metatarsal head with venous insufficiency.  He is currently wearing medical compression stockings.  Assessment & Plan: Visit Diagnoses:  1. History of partial ray amputation of second toe of left foot ([place])   2. Diabetic neurogenic arthropathy ([place])     Plan: Patient is given instruction and demonstrated heel cord stretching for the Achilles tightness continue with the stiff soled shoes continue with the orthotic modified with a felt relieving pad continue with a Band-Aid over the first metatarsal head.  Follow-Up Instructions: Return in about 1 month (around 03/21/2018).   Ortho Exam  Patient is alert, oriented, no adenopathy, well-dressed, normal affect, normal respiratory effort. Examination patient has persistent venous stasis swelling and he has heel cord tightness with dorsiflexion only to neutral he has a very superficial Waggoner grade 1 ulcer beneath the first metatarsal head left foot which is improving and is 5 mm in diameter 0.1 mm deep with healthy granulation tissue.  Imaging: No results found. No images are attached to the encounter.  Labs: Lab Results  Component Value Date   HGBA1C 6.6 (H) 12/17/2017   ESRSEDRATE 9 04/14/2013   CRP 2.4 04/14/2013   REPTSTATUS 11/16/2017 FINAL 11/11/2017   CULT NO GROWTH 5 DAYS 11/11/2017    @LABSALLVALUES (HGBA1)@  There is no height or weight on file to calculate BMI.  Orders:  No  orders of the defined types were placed in this encounter.  No orders of the defined types were placed in this encounter.    Procedures: No procedures performed  Clinical Data: No additional findings.  ROS:  All other systems negative, except as noted in the HPI. Review of Systems  Objective: Vital Signs: There were no vitals taken for this visit.  Specialty Comments:  No specialty comments available.  PMFS History: Patient Active Problem List   Diagnosis Date Noted  . ADD (attention deficit disorder) 11/13/2017  . Anxiety 11/13/2017  . HTN (hypertension) 11/13/2017  . Pre-diabetes 11/13/2017  . Osteomyelitis of left foot ([place])   . Sepsis ([place]) 11/11/2017  . Severe sepsis ([place]) 11/11/2017  . Peripheral neuropathy 04/14/2013   Past Medical History:  Diagnosis Date  . ADD (attention deficit disorder)    adult  . ADD (attention deficit disorder)   . Anxiety and depression   . Arthritis    "probably a touch in my hands" (11/12/2017)  . GERD (gastroesophageal reflux disease)   . Hypertension   . Migraine    "maybe 2 in my lifetime" (11/12/2017)  . MRSA infection (methicillin-resistant Staphylococcus aureus)   . Neuropathy   . OSA on CPAP    uses cpap  . Pre-diabetes   . Restless legs     Family History  Problem Relation Age of Onset  . Cancer Mother   . Kidney  failure Father   . Breast cancer Sister     Past Surgical History:  Procedure Laterality Date  . AMPUTATION Left 12/17/2017   Procedure: LEFT 2ND RAY AMPUTATION;  Surgeon: Newt Minion, MD;  Location: [facility];  Service: Orthopedics;  Laterality: Left;  . NO PAST SURGERIES    . WISDOM TOOTH EXTRACTION     Social History   Occupational History    Comment: [employer]  Tobacco Use  . Smoking status: Current Every Day Smoker    Packs/day: 0.40    Years: 33.00    Pack years: 13.20    Types: Cigarettes  . Smokeless tobacco: Never Used  Substance and Sexual Activity  . Alcohol use: Yes    Alcohol/week:  7.2 oz    Types: 12 Cans of beer per week  . Drug use: No  . Sexual activity: Yes

## 2018-03-14 ENCOUNTER — Encounter (INDEPENDENT_AMBULATORY_CARE_PROVIDER_SITE_OTHER): Payer: Self-pay | Admitting: Orthopedic Surgery

## 2018-03-14 ENCOUNTER — Ambulatory Visit (INDEPENDENT_AMBULATORY_CARE_PROVIDER_SITE_OTHER): Payer: BLUE CROSS/BLUE SHIELD | Admitting: Orthopedic Surgery

## 2018-03-14 DIAGNOSIS — L97521 Non-pressure chronic ulcer of other part of left foot limited to breakdown of skin: Secondary | ICD-10-CM

## 2018-03-14 DIAGNOSIS — E11610 Type 2 diabetes mellitus with diabetic neuropathic arthropathy: Secondary | ICD-10-CM

## 2018-03-14 MED ORDER — SULFAMETHOXAZOLE-TRIMETHOPRIM 800-160 MG PO TABS: 1.0000 | ORAL_TABLET | Freq: Two times a day (BID) | ORAL | 0 refills | Status: DC

## 2018-03-14 NOTE — Progress Notes (Signed)
Office Visit Note   Patient: Johnny Martinez           Date of Birth: 1970-11-16           MRN: [id number] Visit Date: 03/14/2018              Requested by: Celene Squibb, MD [place], [place] [address] PCP: Celene Squibb, MD  Chief Complaint  Patient presents with  . Left Foot - Follow-up, Routine Post Op, Pain      HPI: Patient is a 47 year old gentleman who is seen in follow-up status post left foot second ray amputation with a chronic ulcer beneath the first metatarsal head.  Patient states the ulcer is been there for years.  He states he noticed some increased drainage and bleeding.  He is currently using Neosporin and Dial soap cleansing he has good supportive sneakers.  Assessment & Plan: Visit Diagnoses:  1. Diabetic neurogenic arthropathy ([place])   2. Ulcer of toe of left foot, limited to breakdown of skin ([place])     Plan: Reevaluate in 2 weeks prescription called in for sulfamethoxazole trimethoprim.  Patient will continue with dressing changes continue with protected weightbearing 3 view radiographs of the left foot at follow-up.  Discussed that we may need to consider surgery.  Follow-Up Instructions: Return in about 2 weeks (around 03/28/2018).   Ortho Exam  Patient is alert, oriented, no adenopathy, well-dressed, normal affect, normal respiratory effort. Examination patient has a good dorsalis pedis pulse.  There is no redness no cellulitis no odor no drainage.  The ulcer is 15 mm in diameter and 10 mm deep this does probe down to bone.  There is good bleeding no purulence.  There is good granulation tissue no cellulitis around the wound.  Imaging: No results found. No images are attached to the encounter.  Labs: Lab Results  Component Value Date   HGBA1C 6.6 (H) 12/17/2017   ESRSEDRATE 9 04/14/2013   CRP 2.4 04/14/2013   REPTSTATUS 11/16/2017 FINAL 11/11/2017   CULT NO GROWTH 5 DAYS 11/11/2017     Lab Results  Component Value Date   ALBUMIN 3.5 11/11/2017    There is no height or weight on file to calculate BMI.  Orders:  No orders of the defined types were placed in this encounter.  Meds ordered this encounter  Medications  . sulfamethoxazole-trimethoprim (BACTRIM DS,SEPTRA DS) 800-160 MG tablet    Sig: Take 1 tablet by mouth 2 (two) times daily.    Dispense:  40 tablet    Refill:  0     Procedures: No procedures performed  Clinical Data: No additional findings.  ROS:  All other systems negative, except as noted in the HPI. Review of Systems  Objective: Vital Signs: There were no vitals taken for this visit.  Specialty Comments:  No specialty comments available.  PMFS History: Patient Active Problem List   Diagnosis Date Noted  . Diabetic neurogenic arthropathy ([place]) 03/14/2018  . Ulcer of toe of left foot, limited to breakdown of skin ([place]) 03/14/2018  . ADD (attention deficit disorder) 11/13/2017  . Anxiety 11/13/2017  . HTN (hypertension) 11/13/2017  . Pre-diabetes 11/13/2017  . Osteomyelitis of left foot ([place])   . Sepsis ([place]) 11/11/2017  . Severe sepsis ([place]) 11/11/2017  . Peripheral neuropathy 04/14/2013   Past Medical History:  Diagnosis Date  . ADD (attention deficit disorder)    adult  . ADD (attention deficit disorder)   . Anxiety and  depression   . Arthritis    "probably a touch in my hands" (11/12/2017)  . GERD (gastroesophageal reflux disease)   . Hypertension   . Migraine    "maybe 2 in my lifetime" (11/12/2017)  . MRSA infection (methicillin-resistant Staphylococcus aureus)   . Neuropathy   . OSA on CPAP    uses cpap  . Pre-diabetes   . Restless legs     Family History  Problem Relation Age of Onset  . Cancer Mother   . Kidney failure Father   . Breast cancer Sister     Past Surgical History:  Procedure Laterality Date  . AMPUTATION Left 12/17/2017   Procedure: LEFT 2ND RAY AMPUTATION;  Surgeon: Newt Minion, MD;  Location: [facility];  Service: Orthopedics;   Laterality: Left;  . NO PAST SURGERIES    . WISDOM TOOTH EXTRACTION     Social History   Occupational History    Comment: [employer]  Tobacco Use  . Smoking status: Current Every Day Smoker    Packs/day: 0.40    Years: 33.00    Pack years: 13.20    Types: Cigarettes  . Smokeless tobacco: Never Used  Substance and Sexual Activity  . Alcohol use: Yes    Alcohol/week: 7.2 oz    Types: 12 Cans of beer per week  . Drug use: No  . Sexual activity: Yes

## 2018-03-19 DIAGNOSIS — Z89422 Acquired absence of other left toe(s): Secondary | ICD-10-CM | POA: Diagnosis not present

## 2018-03-19 DIAGNOSIS — R7301 Impaired fasting glucose: Secondary | ICD-10-CM | POA: Diagnosis not present

## 2018-03-19 DIAGNOSIS — Z6835 Body mass index (BMI) 35.0-35.9, adult: Secondary | ICD-10-CM | POA: Diagnosis not present

## 2018-03-19 DIAGNOSIS — Z0001 Encounter for general adult medical examination with abnormal findings: Secondary | ICD-10-CM | POA: Diagnosis not present

## 2018-03-19 DIAGNOSIS — E785 Hyperlipidemia, unspecified: Secondary | ICD-10-CM | POA: Diagnosis not present

## 2018-03-19 DIAGNOSIS — I10 Essential (primary) hypertension: Secondary | ICD-10-CM | POA: Diagnosis not present

## 2018-03-19 DIAGNOSIS — Z6836 Body mass index (BMI) 36.0-36.9, adult: Secondary | ICD-10-CM | POA: Diagnosis not present

## 2018-03-19 DIAGNOSIS — L02612 Cutaneous abscess of left foot: Secondary | ICD-10-CM | POA: Diagnosis not present

## 2018-03-22 DIAGNOSIS — R600 Localized edema: Secondary | ICD-10-CM | POA: Diagnosis not present

## 2018-03-22 DIAGNOSIS — F909 Attention-deficit hyperactivity disorder, unspecified type: Secondary | ICD-10-CM | POA: Diagnosis not present

## 2018-03-22 DIAGNOSIS — E875 Hyperkalemia: Secondary | ICD-10-CM | POA: Diagnosis not present

## 2018-03-22 DIAGNOSIS — Z0000 Encounter for general adult medical examination without abnormal findings: Secondary | ICD-10-CM | POA: Diagnosis not present

## 2018-03-30 ENCOUNTER — Ambulatory Visit (INDEPENDENT_AMBULATORY_CARE_PROVIDER_SITE_OTHER): Payer: BLUE CROSS/BLUE SHIELD | Admitting: Orthopedic Surgery

## 2018-05-12 DIAGNOSIS — G4733 Obstructive sleep apnea (adult) (pediatric): Secondary | ICD-10-CM | POA: Diagnosis not present

## 2018-06-03 DIAGNOSIS — Z6837 Body mass index (BMI) 37.0-37.9, adult: Secondary | ICD-10-CM | POA: Diagnosis not present

## 2018-06-03 DIAGNOSIS — R05 Cough: Secondary | ICD-10-CM | POA: Diagnosis not present

## 2018-08-10 DIAGNOSIS — Z6837 Body mass index (BMI) 37.0-37.9, adult: Secondary | ICD-10-CM | POA: Diagnosis not present

## 2018-08-10 DIAGNOSIS — I10 Essential (primary) hypertension: Secondary | ICD-10-CM | POA: Diagnosis not present

## 2018-08-10 DIAGNOSIS — R05 Cough: Secondary | ICD-10-CM | POA: Diagnosis not present

## 2018-08-10 DIAGNOSIS — R7301 Impaired fasting glucose: Secondary | ICD-10-CM | POA: Diagnosis not present

## 2018-08-10 DIAGNOSIS — Z0001 Encounter for general adult medical examination with abnormal findings: Secondary | ICD-10-CM | POA: Diagnosis not present

## 2018-08-10 DIAGNOSIS — Z6836 Body mass index (BMI) 36.0-36.9, adult: Secondary | ICD-10-CM | POA: Diagnosis not present

## 2018-08-12 DIAGNOSIS — E782 Mixed hyperlipidemia: Secondary | ICD-10-CM | POA: Diagnosis not present

## 2018-08-12 DIAGNOSIS — E1169 Type 2 diabetes mellitus with other specified complication: Secondary | ICD-10-CM | POA: Diagnosis not present

## 2018-08-12 DIAGNOSIS — F909 Attention-deficit hyperactivity disorder, unspecified type: Secondary | ICD-10-CM | POA: Diagnosis not present

## 2018-08-12 DIAGNOSIS — E875 Hyperkalemia: Secondary | ICD-10-CM | POA: Diagnosis not present

## 2019-01-01 ENCOUNTER — Inpatient Hospital Stay (HOSPITAL_COMMUNITY)
Admission: EM | Admit: 2019-01-01 | Discharge: 2019-01-03 | DRG: 638 | Disposition: A | Discharge status: Home / Self Care | Payer: BLUE CROSS/BLUE SHIELD | Attending: Family Medicine | Admitting: Family Medicine

## 2019-01-01 ENCOUNTER — Encounter (HOSPITAL_COMMUNITY): Payer: Self-pay | Admitting: Emergency Medicine

## 2019-01-01 ENCOUNTER — Emergency Department (HOSPITAL_COMMUNITY): Payer: BLUE CROSS/BLUE SHIELD

## 2019-01-01 ENCOUNTER — Other Ambulatory Visit: Payer: Self-pay

## 2019-01-01 DIAGNOSIS — Z841 Family history of disorders of kidney and ureter: Secondary | ICD-10-CM | POA: Diagnosis not present

## 2019-01-01 DIAGNOSIS — R0902 Hypoxemia: Secondary | ICD-10-CM | POA: Diagnosis not present

## 2019-01-01 DIAGNOSIS — F419 Anxiety disorder, unspecified: Secondary | ICD-10-CM | POA: Diagnosis present

## 2019-01-01 DIAGNOSIS — R531 Weakness: Secondary | ICD-10-CM | POA: Diagnosis not present

## 2019-01-01 DIAGNOSIS — Z9989 Dependence on other enabling machines and devices: Secondary | ICD-10-CM | POA: Diagnosis not present

## 2019-01-01 DIAGNOSIS — Z7984 Long term (current) use of oral hypoglycemic drugs: Secondary | ICD-10-CM

## 2019-01-01 DIAGNOSIS — R7303 Prediabetes: Secondary | ICD-10-CM

## 2019-01-01 DIAGNOSIS — I10 Essential (primary) hypertension: Secondary | ICD-10-CM | POA: Diagnosis not present

## 2019-01-01 DIAGNOSIS — E876 Hypokalemia: Secondary | ICD-10-CM | POA: Diagnosis present

## 2019-01-01 DIAGNOSIS — A419 Sepsis, unspecified organism: Secondary | ICD-10-CM | POA: Diagnosis not present

## 2019-01-01 DIAGNOSIS — E669 Obesity, unspecified: Secondary | ICD-10-CM | POA: Diagnosis present

## 2019-01-01 DIAGNOSIS — F329 Major depressive disorder, single episode, unspecified: Secondary | ICD-10-CM | POA: Diagnosis not present

## 2019-01-01 DIAGNOSIS — Z803 Family history of malignant neoplasm of breast: Secondary | ICD-10-CM | POA: Diagnosis not present

## 2019-01-01 DIAGNOSIS — Z8614 Personal history of Methicillin resistant Staphylococcus aureus infection: Secondary | ICD-10-CM | POA: Diagnosis not present

## 2019-01-01 DIAGNOSIS — Z8619 Personal history of other infectious and parasitic diseases: Secondary | ICD-10-CM

## 2019-01-01 DIAGNOSIS — E872 Acidosis: Secondary | ICD-10-CM | POA: Diagnosis present

## 2019-01-01 DIAGNOSIS — E1142 Type 2 diabetes mellitus with diabetic polyneuropathy: Secondary | ICD-10-CM | POA: Diagnosis present

## 2019-01-01 DIAGNOSIS — Z8739 Personal history of other diseases of the musculoskeletal system and connective tissue: Secondary | ICD-10-CM | POA: Diagnosis not present

## 2019-01-01 DIAGNOSIS — F17210 Nicotine dependence, cigarettes, uncomplicated: Secondary | ICD-10-CM | POA: Diagnosis present

## 2019-01-01 DIAGNOSIS — E11621 Type 2 diabetes mellitus with foot ulcer: Principal | ICD-10-CM | POA: Diagnosis present

## 2019-01-01 DIAGNOSIS — L97519 Non-pressure chronic ulcer of other part of right foot with unspecified severity: Secondary | ICD-10-CM | POA: Diagnosis not present

## 2019-01-01 DIAGNOSIS — Z792 Long term (current) use of antibiotics: Secondary | ICD-10-CM

## 2019-01-01 DIAGNOSIS — F909 Attention-deficit hyperactivity disorder, unspecified type: Secondary | ICD-10-CM | POA: Diagnosis not present

## 2019-01-01 DIAGNOSIS — Z79891 Long term (current) use of opiate analgesic: Secondary | ICD-10-CM | POA: Diagnosis not present

## 2019-01-01 DIAGNOSIS — Z79899 Other long term (current) drug therapy: Secondary | ICD-10-CM

## 2019-01-01 DIAGNOSIS — Z89422 Acquired absence of other left toe(s): Secondary | ICD-10-CM | POA: Diagnosis not present

## 2019-01-01 DIAGNOSIS — S91301A Unspecified open wound, right foot, initial encounter: Secondary | ICD-10-CM | POA: Diagnosis not present

## 2019-01-01 DIAGNOSIS — Z91013 Allergy to seafood: Secondary | ICD-10-CM

## 2019-01-01 DIAGNOSIS — J9811 Atelectasis: Secondary | ICD-10-CM | POA: Diagnosis not present

## 2019-01-01 DIAGNOSIS — L03115 Cellulitis of right lower limb: Secondary | ICD-10-CM | POA: Diagnosis present

## 2019-01-01 DIAGNOSIS — G4733 Obstructive sleep apnea (adult) (pediatric): Secondary | ICD-10-CM | POA: Diagnosis present

## 2019-01-01 DIAGNOSIS — G2581 Restless legs syndrome: Secondary | ICD-10-CM | POA: Diagnosis present

## 2019-01-01 DIAGNOSIS — Z883 Allergy status to other anti-infective agents status: Secondary | ICD-10-CM

## 2019-01-01 DIAGNOSIS — L84 Corns and callosities: Secondary | ICD-10-CM | POA: Diagnosis not present

## 2019-01-01 LAB — CBC WITH DIFFERENTIAL/PLATELET
ABS IMMATURE GRANULOCYTES: 0.04 10*3/uL (ref 0.00–0.07)
Abs Immature Granulocytes: 0.03 10*3/uL (ref 0.00–0.07)
BASOS PCT: 1 %
Basophils Absolute: 0.1 10*3/uL (ref 0.0–0.1)
Basophils Absolute: 0.1 10*3/uL (ref 0.0–0.1)
Basophils Relative: 1 %
EOS ABS: 0.1 10*3/uL (ref 0.0–0.5)
EOS PCT: 1 %
EOS PCT: 1 %
Eosinophils Absolute: 0.1 10*3/uL (ref 0.0–0.5)
HCT: 41.6 % (ref 39.0–52.0)
HEMATOCRIT: 44.8 % (ref 39.0–52.0)
Hemoglobin: 14.5 g/dL (ref 13.0–17.0)
Hemoglobin: 13.4 g/dL (ref 13.0–17.0)
Immature Granulocytes: 0 %
Immature Granulocytes: 1 %
LYMPHS ABS: 1 10*3/uL (ref 0.7–4.0)
Lymphocytes Relative: 12 %
Lymphocytes Relative: 16 %
Lymphs Abs: 1.3 10*3/uL (ref 0.7–4.0)
MCH: 32.5 pg (ref 26.0–34.0)
MCH: 31.7 pg (ref 26.0–34.0)
MCHC: 32.4 g/dL (ref 30.0–36.0)
MCHC: 32.2 g/dL (ref 30.0–36.0)
MCV: 100.4 fL — AB (ref 80.0–100.0)
MCV: 98.3 fL (ref 80.0–100.0)
MONO ABS: 1.1 10*3/uL — AB (ref 0.1–1.0)
MONOS PCT: 9 %
MONOS PCT: 14 %
Monocytes Absolute: 0.7 10*3/uL (ref 0.1–1.0)
NEUTROS ABS: 6.1 10*3/uL (ref 1.7–7.7)
NEUTROS ABS: 5.6 10*3/uL (ref 1.7–7.7)
NEUTROS PCT: 77 %
NRBC: 0 % (ref 0.0–0.2)
Neutrophils Relative %: 67 %
PLATELETS: 234 10*3/uL (ref 150–400)
Platelets: 250 10*3/uL (ref 150–400)
RBC: 4.46 MIL/uL (ref 4.22–5.81)
RBC: 4.23 MIL/uL (ref 4.22–5.81)
RDW: 12.4 % (ref 11.5–15.5)
RDW: 12.6 % (ref 11.5–15.5)
WBC: 7.8 10*3/uL (ref 4.0–10.5)
WBC: 8.2 10*3/uL (ref 4.0–10.5)
nRBC: 0 % (ref 0.0–0.2)

## 2019-01-01 LAB — BASIC METABOLIC PANEL
Anion gap: 9 (ref 5–15)
BUN: 10 mg/dL (ref 6–20)
CALCIUM: 8.8 mg/dL — AB (ref 8.9–10.3)
CO2: 31 mmol/L (ref 22–32)
CREATININE: 1.01 mg/dL (ref 0.61–1.24)
Chloride: 97 mmol/L — ABNORMAL LOW (ref 98–111)
GFR calc Af Amer: >60 mL/min (ref >60)
GFR calc non Af Amer: >60 mL/min (ref >60)
GLUCOSE: 270 mg/dL — AB (ref 70–99)
Potassium: 3.8 mmol/L (ref 3.5–5.1)
Sodium: 137 mmol/L (ref 135–145)

## 2019-01-01 LAB — LACTIC ACID, PLASMA
Lactic Acid, Venous: 3.1 mmol/L (ref 0.5–1.9)
Lactic Acid, Venous: 1.5 mmol/L (ref 0.5–1.9)

## 2019-01-01 MED ORDER — VANCOMYCIN HCL IN DEXTROSE 1-5 GM/200ML-% IV SOLN
1000.0000 mg | INTRAVENOUS | Status: AC
Start: 2019-01-01 — End: 2019-01-01
  Administered 2019-01-01 (×2): 1000 mg via INTRAVENOUS
  Filled 2019-01-01 (×2): qty 200

## 2019-01-01 MED ORDER — CEFAZOLIN SODIUM-DEXTROSE 1-4 GM/50ML-% IV SOLN
1.0000 g | Freq: Once | INTRAVENOUS | Status: AC
  Administered 2019-01-01: 1 g via INTRAVENOUS
  Filled 2019-01-01: qty 50

## 2019-01-01 MED ORDER — CYCLOBENZAPRINE HCL 10 MG PO TABS
10.0000 mg | ORAL_TABLET | Freq: Three times a day (TID) | ORAL | Status: DC | PRN
  Administered 2019-01-01 – 2019-01-02 (×2): 10 mg via ORAL
  Filled 2019-01-01 (×2): qty 1

## 2019-01-01 MED ORDER — POTASSIUM CHLORIDE IN NACL 20-0.9 MEQ/L-% IV SOLN
INTRAVENOUS | Status: DC
  Administered 2019-01-01: 22:00:00 via INTRAVENOUS
  Filled 2019-01-01: qty 1000

## 2019-01-01 MED ORDER — SODIUM CHLORIDE 0.9 % IV BOLUS (SEPSIS)
1000.0000 mL | Freq: Once | INTRAVENOUS | Status: AC
  Administered 2019-01-01: 1000 mL via INTRAVENOUS

## 2019-01-01 MED ORDER — SODIUM CHLORIDE 0.9 % IV SOLN
2.0000 g | INTRAVENOUS | Status: DC
  Administered 2019-01-01 – 2019-01-02 (×2): 2 g via INTRAVENOUS
  Filled 2019-01-01 (×2): qty 20

## 2019-01-01 MED ORDER — VANCOMYCIN HCL IN DEXTROSE 1-5 GM/200ML-% IV SOLN
1000.0000 mg | Freq: Three times a day (TID) | INTRAVENOUS | Status: DC
  Administered 2019-01-02 – 2019-01-03 (×4): 1000 mg via INTRAVENOUS
  Filled 2019-01-01 (×4): qty 200

## 2019-01-01 MED ORDER — SODIUM CHLORIDE 0.9 % IV SOLN
1000.0000 mL | INTRAVENOUS | Status: DC
Start: 2019-01-01 — End: 2019-01-01
  Administered 2019-01-01: 1000 mL via INTRAVENOUS

## 2019-01-01 MED ORDER — SODIUM CHLORIDE 0.9 % IV BOLUS (SEPSIS)
1500.0000 mL | Freq: Once | INTRAVENOUS | Status: AC
  Administered 2019-01-01: 1500 mL via INTRAVENOUS

## 2019-01-01 NOTE — ED Notes (Signed)
Date and time results received: 01/01/19 6:33 PM   Test: lactic Critical Value: 3.1  Name of Provider Notified: Tomi Bamberger  Orders Received? Or Actions Taken?: none at this time, will continue to monitor.

## 2019-01-01 NOTE — Progress Notes (Signed)
Pharmacy Anti-Microbial Dosing Consult: Initial antibiotic  regimen of vancomycin 2g ordered to treat wound  infection.  Estimated Creatinine Clearance: 118 mL/min (by C-G formula based on SCr of 1.01 mg/dL).   Allergies  Allergen Reactions  . Iodine Anaphylaxis  . Shellfish Allergy Anaphylaxis    Vitals:   01/01/19 1406  BP: (!) 136/106  Pulse: (!) 125  Resp: 20  Temp: 99.4 F (37.4 C)  SpO2: 96%    Anti-infectives (From admission, onward)   Start     Dose/Rate Route Frequency Ordered Stop   01/01/19 1900  vancomycin (VANCOCIN) IVPB 1000 mg/200 mL premix     1,000 mg 200 mL/hr over 60 Minutes Intravenous Every 1 hr x 2 01/01/19 1747 01/01/19 2059      Antimicrobials this admission:  Vancomycin 3/1 >>      Microbiology results: nothing ordered at this time  BCx:     UCx:     Sputum:      MRSA PCR:    Plan: Initial dose of vancomycin 2g X 1 dose ordered. F/U admission orders for further dosing if therapy continued.  Despina Pole, [facility] 01/01/2019 5:48 PM

## 2019-01-01 NOTE — H&P (Addendum)
History and Physical    Johnny Martinez:[id number] DOB: [birth year] DOA: 01/01/2019  PCP: Celene Squibb, MD   Patient coming from: Home  Chief Complaint: Weakness, fever, Body aches  HPI: Johnny Martinez is a 48 y.o. male with medical history significant for attention deficit disorder, HTN, prediabetes,  anxiety and depression with restless leg syndrome. Patient presented to the ED with complaints of generalized weakness with body aches and fever-maximum temperature 101 at home.  He has had a chronic wound on the undersurface of his right lower extremity for at least 2 years.  Today he noticed swelling and redness extending towards his ankle with drainage of bloody fluid from the wound.  He reports reduced sensation in his bilateral lower extremities, but no diagnosis of diabetes. He denies difficulty breathing or chest pain, no cough, no burning with urination, no vomiting no loose stools no abdominal pain.  Patient has a history of left foot osteomyelitis requiring ray amputation of the second digit of his foot 11/2017.  At that time wound culture results done at an outside facility grew MRSA.  ED Course: 95/56.  Initial tachycardia to 120s.  Temperature 99.4. WBC 7.8.  Lactic acidosis 3.1. 2 view chest x-ray abnormality.  Right foot x-ray no soft tissue gas or bony erosion.  He was started on IV vancomycin and cefazolin.  Hospitalist to admit for right lower extremity cellulitis.  Review of Systems: As per HPI all other systems reviewed and negative.  Past Medical History:  Diagnosis Date  . ADD (attention deficit disorder)    adult  . ADD (attention deficit disorder)   . Anxiety and depression   . Arthritis    "probably a touch in my hands" (11/12/2017)  . GERD (gastroesophageal reflux disease)   . Hypertension   . Migraine    "maybe 2 in my lifetime" (11/12/2017)  . MRSA infection (methicillin-resistant Staphylococcus aureus)   . Neuropathy   . OSA on CPAP    uses cpap  .  Pre-diabetes   . Restless legs     Past Surgical History:  Procedure Laterality Date  . AMPUTATION Left 12/17/2017   Procedure: LEFT 2ND RAY AMPUTATION;  Surgeon: Newt Minion, MD;  Location: [facility];  Service: Orthopedics;  Laterality: Left;  . NO PAST SURGERIES    . WISDOM TOOTH EXTRACTION       reports that he has been smoking cigarettes. He has a 13.20 pack-year smoking history. He has never used smokeless tobacco. He reports current alcohol use of about 12.0 standard drinks of alcohol per week. He reports that he does not use drugs.  Allergies  Allergen Reactions  . Iodine Anaphylaxis  . Shellfish Allergy Anaphylaxis    Family History  Problem Relation Age of Onset  . Cancer Mother   . Kidney failure Father   . Breast cancer Sister     Prior to Admission medications   Medication Sig Start Date End Date Taking? Authorizing Provider  amphetamine-dextroamphetamine (ADDERALL XR) 10 MG 24 hr capsule Take 10 mg by mouth daily.   Yes [provider]  GINSENG PO Take 1 tablet by mouth daily.   Yes [provider]  glipiZIDE (GLUCOTROL XL) 5 MG 24 hr tablet Take 5 mg by mouth daily. 12/27/18  Yes [provider]  HYDROcodone-acetaminophen (NORCO/VICODIN) 5-325 MG tablet Take 0.5 tablets by mouth daily as needed for moderate pain. Patient taking differently: Take 0.5-1 tablets by mouth daily as needed for moderate pain.  12/17/17  Yes Newt Minion, MD  lisinopril-hydrochlorothiazide (PRINZIDE,ZESTORETIC) 20-12.5 MG per tablet Take 1 tablet by mouth daily.   Yes [provider]    Physical Exam: Vitals:   01/01/19 1406 01/01/19 1407 01/01/19 1921 01/01/19 1923  BP: (!) 136/106   (!) 95/56  Pulse: (!) 125  90 88  Resp: 20  (!) 23 16  Temp: 99.4 F (37.4 C)     TempSrc: Oral     SpO2: 96%  97% 100%  Weight:  108.9 kg    Height:  6\' 3"  (1.905 m)      Constitutional: NAD, calm, comfortable Vitals:   01/01/19 1406 01/01/19 1407 01/01/19  1921 01/01/19 1923  BP: (!) 136/106   (!) 95/56  Pulse: (!) 125  90 88  Resp: 20  (!) 23 16  Temp: 99.4 F (37.4 C)     TempSrc: Oral     SpO2: 96%  97% 100%  Weight:  108.9 kg    Height:  6\' 3"  (1.905 m)     Eyes: PERRL, lids and conjunctivae normal ENMT: Mucous membranes are moist. Posterior pharynx clear of any exudate or lesions. Neck: normal, supple, no masses, no thyromegaly Respiratory: clear to auscultation bilaterally, no wheezing, no crackles. Normal respiratory effort. No accessory muscle use.  Cardiovascular: Regular rate and rhythm, no murmurs / rubs / gallops. No extremity edema. 2+ pedal pulses.  Abdomen: no tenderness, no masses palpated. No hepatosplenomegaly. Bowel sounds positive.  Musculoskeletal: no clubbing / cyanosis.  Surgical ray amputation changes left foot.  Good ROM, no contractures. Normal muscle tone.  Skin: Redness, warmth, tenderness and swelling extending from dorsal surface of right foot towards ankle, ~3 by 3cm wound with surrounding callus, that appears superficial, No induration Neurologic: CN 2-12 grossly intact.  Strength 5/5 in all 4.  Psychiatric: Normal judgment and insight. Alert and oriented x 3. Normal mood.         Labs on Admission: I have personally reviewed following labs and imaging studies  CBC: Recent Labs  Lab 01/01/19 1518 01/01/19 1902  WBC 7.8 8.2  NEUTROABS 6.1 5.6  HGB 14.5 13.4  HCT 44.8 41.6  MCV 100.4* 98.3  PLT 250 161   Basic Metabolic Panel: Recent Labs  Lab 01/01/19 1518  NA 137  K 3.8  CL 97*  CO2 31  GLUCOSE 270*  BUN 10  CREATININE 1.01  CALCIUM 8.8*    Radiological Exams on Admission: Dg Chest 2 View  Result Date: 01/01/2019 CLINICAL DATA:  Increased weakness.  Fever and chills. EXAM: CHEST - 2 VIEW COMPARISON:  None. FINDINGS: Minimal atelectasis in the bases. The heart, hila, mediastinum, lungs, and pleura are otherwise normal. IMPRESSION: No active cardiopulmonary disease. Electronically  Signed   By: Dorise Bullion III M.D   On: 01/01/2019 19:12   Dg Foot Complete Right  Result Date: 01/01/2019 CLINICAL DATA:  Ulcers in both feet. EXAM: RIGHT FOOT COMPLETE - 3+ VIEW COMPARISON:  None. FINDINGS: Possible soft tissue swelling in the lateral foot overlying the proximal fifth digit and distal fifth metatarsal. No air in the soft tissues. No underlying bony erosion. No fractures. No other acute abnormalities identified. IMPRESSION: I believe the patient's ulcer is likely in the region of the fifth MTP joint with no soft tissue gas or bony erosion noted. Recommend clinical correlation. Electronically Signed   By: Dorise Bullion III M.D   On: 01/01/2019 19:14    EKG: Independently reviewed.  Artifacts.  Sinus rhythm.  QTc  447.  No significant change from prior.  Assessment/Plan Active Problems:   Cellulitis of right leg  Cellulitis of right lower extremity-with initial tachycardia, WBC normal 7.8.  Patient does not meet sepsis criteria but has a lactic acidosis 3.1 > 1.5, s/p 2L.  Soft blood pressure 95/56, improved.  At this time wound appears superficial.  X-ray negative for acute abnormality. -Continue IV vancomycin, add ceftriaxone -Blood cultures -Continue IV fluid N/2 + 20KCL 100cc/hr x 12hrs -Low threshold to get CT/further imaging of his right lower extremity, rule out abscess/collection, I recommended getting imaging now but at this time, patient wants trial of IV antibiotics.  Pre- Diabetes- Glucose 270. - SSI -Continue home glipizide  - HGgbA1c  HTN-soft blood pressure. -Hold home lisinopril HCTZ  ADD-continue home Adderall.   DVT prophylaxis: SCDs for now pending CT in a.m. if surgical intervention is needed Code Status: Full Family Communication: None at bedside Disposition Plan: Per rounding team Consults called: None Admission status: Inpt, tele   Bethena Roys MD [facility]  01/01/2019, 9:34 PM

## 2019-01-01 NOTE — ED Provider Notes (Signed)
[facility] EMERGENCY DEPARTMENT Provider Note   CSN: [id number] Arrival date & time: 01/01/19  1357    History   Chief Complaint Chief Complaint  Patient presents with  . Weakness    HPI Johnny Martinez is a 48 y.o. male.     HPI Pt has been feeling weak and has had general malaise the last couple of days.  He has had chills.  Temp has been running 97.  Pt also has noticed swelling in his foot.  He has had a sore on the right side of his foot for a while he has a history of MRSA infection and presented similarly.  Patient is concerned that he may have a recurrent infection.  Had a slight cough but denies any vomiting or diarrhea.  No abdominal pain.  No sore throat. Past Medical History:  Diagnosis Date  . ADD (attention deficit disorder)    adult  . ADD (attention deficit disorder)   . Anxiety and depression   . Arthritis    "probably a touch in my hands" (11/12/2017)  . GERD (gastroesophageal reflux disease)   . Hypertension   . Migraine    "maybe 2 in my lifetime" (11/12/2017)  . MRSA infection (methicillin-resistant Staphylococcus aureus)   . Neuropathy   . OSA on CPAP    uses cpap  . Pre-diabetes   . Restless legs     Patient Active Problem List   Diagnosis Date Noted  . Diabetic neurogenic arthropathy ([place]) 03/14/2018  . Ulcer of toe of left foot, limited to breakdown of skin ([place]) 03/14/2018  . ADD (attention deficit disorder) 11/13/2017  . Anxiety 11/13/2017  . HTN (hypertension) 11/13/2017  . Pre-diabetes 11/13/2017  . Osteomyelitis of left foot ([place])   . Sepsis ([place]) 11/11/2017  . Severe sepsis ([place]) 11/11/2017  . Peripheral neuropathy 04/14/2013    Past Surgical History:  Procedure Laterality Date  . AMPUTATION Left 12/17/2017   Procedure: LEFT 2ND RAY AMPUTATION;  Surgeon: Newt Minion, MD;  Location: [facility];  Service: Orthopedics;  Laterality: Left;  . NO PAST SURGERIES    . WISDOM TOOTH EXTRACTION          Home Medications    Prior to  Admission medications   Medication Sig Start Date End Date Taking? Authorizing Provider  amphetamine-dextroamphetamine (ADDERALL XR) 10 MG 24 hr capsule Take 10 mg by mouth daily.    [provider]  HYDROcodone-acetaminophen (NORCO/VICODIN) 5-325 MG tablet Take 1-2 tablets by mouth every 4 (four) hours as needed for moderate pain. 12/17/17   Newt Minion, MD  HYDROcodone-acetaminophen (NORCO/VICODIN) 5-325 MG tablet Take 0.5 tablets by mouth daily as needed for moderate pain. 12/17/17   Newt Minion, MD  lisinopril-hydrochlorothiazide (PRINZIDE,ZESTORETIC) 20-12.5 MG per tablet Take 1 tablet by mouth daily.    [provider]  sulfamethoxazole-trimethoprim (BACTRIM DS,SEPTRA DS) 800-160 MG tablet Take 1 tablet by mouth 2 (two) times daily. 03/14/18   Newt Minion, MD    Family History Family History  Problem Relation Age of Onset  . Cancer Mother   . Kidney failure Father   . Breast cancer Sister     Social History Social History   Tobacco Use  . Smoking status: Current Every Day Smoker    Packs/day: 0.40    Years: 33.00    Pack years: 13.20    Types: Cigarettes  . Smokeless tobacco: Never Used  Substance Use Topics  . Alcohol use: Yes    Alcohol/week:  12.0 standard drinks    Types: 12 Cans of beer per week  . Drug use: No     Allergies   Iodine and Shellfish allergy   Review of Systems Review of Systems  All other systems reviewed and are negative.    Physical Exam Updated Vital Signs BP (!) 136/106 (BP Location: Right Arm)   Pulse 90   Temp 99.4 F (37.4 C) (Oral)   Resp (!) 23   Ht 1.905 m (6\' 3" )   Wt 108.9 kg   SpO2 97%   BMI 30.00 kg/m   Physical Exam Vitals signs and nursing note reviewed.  Constitutional:      General: He is not in acute distress.    Appearance: He is well-developed.  HENT:     Head: Normocephalic and atraumatic.     Right Ear: External ear normal.     Left Ear: External ear normal.  Eyes:     General:  No scleral icterus.       Right eye: No discharge.        Left eye: No discharge.     Conjunctiva/sclera: Conjunctivae normal.  Neck:     Musculoskeletal: Neck supple.     Trachea: No tracheal deviation.  Cardiovascular:     Rate and Rhythm: Normal rate and regular rhythm.  Pulmonary:     Effort: Pulmonary effort is normal. No respiratory distress.     Breath sounds: Normal breath sounds. No stridor. No wheezing or rales.  Abdominal:     General: Bowel sounds are normal. There is no distension.     Palpations: Abdomen is soft.     Tenderness: There is no abdominal tenderness. There is no guarding or rebound.  Musculoskeletal:        General: Swelling present. No tenderness.     Comments: Ulcerative wound right lateral foot, erythema and edema  Skin:    General: Skin is warm and dry.     Findings: No rash.  Neurological:     Mental Status: He is alert.     Cranial Nerves: No cranial nerve deficit (no facial droop, extraocular movements intact, no slurred speech).     Sensory: No sensory deficit.     Motor: No abnormal muscle tone or seizure activity.     Coordination: Coordination normal.      ED Treatments / Results  Labs (all labs ordered are listed, but only abnormal results are displayed) Labs Reviewed  CBC WITH DIFFERENTIAL/PLATELET - Abnormal; Notable for the following components:      Result Value   MCV 100.4 (*)    All other components within normal limits  BASIC METABOLIC PANEL - Abnormal; Notable for the following components:   Chloride 97 (*)    Glucose, Bld 270 (*)    Calcium 8.8 (*)    All other components within normal limits  LACTIC ACID, PLASMA - Abnormal; Notable for the following components:   Lactic Acid, Venous 3.1 (*)    All other components within normal limits  CBC WITH DIFFERENTIAL/PLATELET - Abnormal; Notable for the following components:   Monocytes Absolute 1.1 (*)    All other components within normal limits  CULTURE, BLOOD (ROUTINE X 2)    CULTURE, BLOOD (ROUTINE X 2)  URINALYSIS, ROUTINE W REFLEX MICROSCOPIC    EKG None  Radiology Dg Chest 2 View  Result Date: 01/01/2019 CLINICAL DATA:  Increased weakness.  Fever and chills. EXAM: CHEST - 2 VIEW COMPARISON:  None. FINDINGS: Minimal atelectasis in  the bases. The heart, hila, mediastinum, lungs, and pleura are otherwise normal. IMPRESSION: No active cardiopulmonary disease. Electronically Signed   By: Dorise Bullion III M.D   On: 01/01/2019 19:12   Dg Foot Complete Right  Result Date: 01/01/2019 CLINICAL DATA:  Ulcers in both feet. EXAM: RIGHT FOOT COMPLETE - 3+ VIEW COMPARISON:  None. FINDINGS: Possible soft tissue swelling in the lateral foot overlying the proximal fifth digit and distal fifth metatarsal. No air in the soft tissues. No underlying bony erosion. No fractures. No other acute abnormalities identified. IMPRESSION: I believe the patient's ulcer is likely in the region of the fifth MTP joint with no soft tissue gas or bony erosion noted. Recommend clinical correlation. Electronically Signed   By: Dorise Bullion III M.D   On: 01/01/2019 19:14    Procedures .Critical Care Performed by: Dorie Rank, MD Authorized by: Dorie Rank, MD   Critical care provider statement:    Critical care time (minutes):  45   Critical care was time spent personally by me on the following activities:  Discussions with consultants, evaluation of patient's response to treatment, examination of patient, ordering and performing treatments and interventions, ordering and review of laboratory studies, ordering and review of radiographic studies, pulse oximetry, re-evaluation of patient's condition, obtaining history from patient or surrogate and review of old charts   (including critical care time)  Medications Ordered in ED Medications  sodium chloride 0.9 % bolus 1,000 mL (1,000 mLs Intravenous New Bag/Given 01/01/19 1831)    Followed by  sodium chloride 0.9 % bolus 1,000 mL (1,000 mLs  Intravenous New Bag/Given 01/01/19 1830)    Followed by  0.9 %  sodium chloride infusion (has no administration in time range)  vancomycin (VANCOCIN) IVPB 1000 mg/200 mL premix (1,000 mg Intravenous New Bag/Given 01/01/19 1848)  ceFAZolin (ANCEF) IVPB 1 g/50 mL premix (1 g Intravenous New Bag/Given 01/01/19 1910)  vancomycin (VANCOCIN) IVPB 1000 mg/200 mL premix (has no administration in time range)  sodium chloride 0.9 % bolus 1,500 mL (1,500 mLs Intravenous New Bag/Given 01/01/19 1907)     Initial Impression / Assessment and Plan / ED Course  I have reviewed the triage vital signs and the nursing notes.  Pertinent labs & imaging results that were available during my care of the patient were reviewed by me and considered in my medical decision making (see chart for details).  Clinical Course as of Dec 31 1921  Nancy Fetter Jan 01, 2019  1920 At the bedside heart rates now in the 90s.    [JK]    Clinical Course User Index [JK] Dorie Rank, MD     Patient presented to the emergency room for evaluation of probable cellulitis.  On exam the patient does have evidence of cellulitis on his right foot.  His lactic acid level is slightly elevated although no signs of severe sepsis.  He was tachycardic initially.  I have started him on broad-spectrum antibiotics.  I will admit him to the hospital for further treatment of patient.  Final Clinical Impressions(s) / ED Diagnoses   Final diagnoses:  Cellulitis of right lower extremity  Sepsis, due to unspecified organism, unspecified whether acute organ dysfunction present [facility])      Dorie Rank, MD 01/01/19 [phone]

## 2019-01-01 NOTE — ED Notes (Signed)
Pt refused being swabbed for the flu due to insurance problems

## 2019-01-01 NOTE — ED Triage Notes (Signed)
Pt states having increased weakness, fever, chills and body aches since Friday.  Pt states having an ucler both feet and states" I think it coming from that"

## 2019-01-02 ENCOUNTER — Inpatient Hospital Stay (HOSPITAL_COMMUNITY): Payer: BLUE CROSS/BLUE SHIELD

## 2019-01-02 ENCOUNTER — Encounter (HOSPITAL_COMMUNITY): Payer: Self-pay

## 2019-01-02 LAB — CBG MONITORING, ED
GLUCOSE-CAPILLARY: 158 mg/dL — AB (ref 70–99)
Glucose-Capillary: 168 mg/dL — ABNORMAL HIGH (ref 70–99)

## 2019-01-02 LAB — BASIC METABOLIC PANEL
Anion gap: 9 (ref 5–15)
BUN: 10 mg/dL (ref 6–20)
CO2: 27 mmol/L (ref 22–32)
Calcium: 8.2 mg/dL — ABNORMAL LOW (ref 8.9–10.3)
Chloride: 101 mmol/L (ref 98–111)
Creatinine, Ser: 0.7 mg/dL (ref 0.61–1.24)
GFR calc non Af Amer: >60 mL/min (ref >60)
Glucose, Bld: 176 mg/dL — ABNORMAL HIGH (ref 70–99)
Potassium: 3.4 mmol/L — ABNORMAL LOW (ref 3.5–5.1)
SODIUM: 137 mmol/L (ref 135–145)

## 2019-01-02 LAB — CBC
HCT: 40.4 % (ref 39.0–52.0)
Hemoglobin: 12.9 g/dL — ABNORMAL LOW (ref 13.0–17.0)
MCH: 31.9 pg (ref 26.0–34.0)
MCHC: 31.9 g/dL (ref 30.0–36.0)
MCV: 99.8 fL (ref 80.0–100.0)
NRBC: 0 % (ref 0.0–0.2)
Platelets: 231 10*3/uL (ref 150–400)
RBC: 4.05 MIL/uL — AB (ref 4.22–5.81)
RDW: 12.7 % (ref 11.5–15.5)
WBC: 8.1 10*3/uL (ref 4.0–10.5)

## 2019-01-02 LAB — HEMOGLOBIN A1C
Hgb A1c MFr Bld: 6.9 % — ABNORMAL HIGH (ref 4.8–5.6)
Mean Plasma Glucose: 151.33 mg/dL

## 2019-01-02 LAB — GLUCOSE, CAPILLARY
GLUCOSE-CAPILLARY: 185 mg/dL — AB (ref 70–99)
Glucose-Capillary: 111 mg/dL — ABNORMAL HIGH (ref 70–99)

## 2019-01-02 MED ORDER — ONDANSETRON HCL 4 MG/2ML IJ SOLN: 4.0000 mg | Freq: Four times a day (QID) | INTRAMUSCULAR | Status: DC | PRN

## 2019-01-02 MED ORDER — ACETAMINOPHEN 325 MG PO TABS: 650.0000 mg | ORAL_TABLET | Freq: Four times a day (QID) | ORAL | Status: DC | PRN

## 2019-01-02 MED ORDER — POTASSIUM CHLORIDE IN NACL 20-0.9 MEQ/L-% IV SOLN
INTRAVENOUS | Status: DC
  Administered 2019-01-02: 07:00:00 via INTRAVENOUS
  Filled 2019-01-02: qty 1000

## 2019-01-02 MED ORDER — POLYETHYLENE GLYCOL 3350 17 G PO PACK: 17.0000 g | PACK | Freq: Every day | ORAL | Status: DC | PRN

## 2019-01-02 MED ORDER — HYDROCODONE-ACETAMINOPHEN 5-325 MG PO TABS: 0.5000 | ORAL_TABLET | Freq: Every day | ORAL | Status: DC | PRN

## 2019-01-02 MED ORDER — ENOXAPARIN SODIUM 60 MG/0.6ML ~~LOC~~ SOLN
0.5000 mg/kg | SUBCUTANEOUS | Status: DC
  Filled 2019-01-02: qty 0.6

## 2019-01-02 MED ORDER — GLIPIZIDE ER 5 MG PO TB24
5.0000 mg | ORAL_TABLET | Freq: Every day | ORAL | Status: DC
  Administered 2019-01-02: 5 mg via ORAL
  Filled 2019-01-02 (×4): qty 1

## 2019-01-02 MED ORDER — AMPHETAMINE-DEXTROAMPHET ER 5 MG PO CP24: 10.0000 mg | ORAL_CAPSULE | Freq: Every day | ORAL | Status: DC

## 2019-01-02 MED ORDER — ACETAMINOPHEN 650 MG RE SUPP: 650.0000 mg | Freq: Four times a day (QID) | RECTAL | Status: DC | PRN

## 2019-01-02 MED ORDER — INSULIN ASPART 100 UNIT/ML ~~LOC~~ SOLN
0.0000 [IU] | Freq: Three times a day (TID) | SUBCUTANEOUS | Status: DC
  Administered 2019-01-02 (×2): 2 [IU] via SUBCUTANEOUS
  Filled 2019-01-02 (×2): qty 1

## 2019-01-02 MED ORDER — ONDANSETRON HCL 4 MG PO TABS: 4.0000 mg | ORAL_TABLET | Freq: Four times a day (QID) | ORAL | Status: DC | PRN

## 2019-01-02 MED ORDER — METRONIDAZOLE 500 MG PO TABS
500.0000 mg | ORAL_TABLET | Freq: Three times a day (TID) | ORAL | Status: DC
  Administered 2019-01-02 – 2019-01-03 (×2): 500 mg via ORAL
  Filled 2019-01-02 (×2): qty 1

## 2019-01-02 NOTE — Progress Notes (Signed)
PROGRESS NOTE  Johnny Martinez  KYH:[id number] DOB: 09-Oct-1971 DOA: 01/01/2019 PCP: Celene Squibb, MD   Brief Narrative: Johnny Martinez is a 48 y.o. male with a history of prediabetes, HTN, ADHD, anxiety, depression, and peripheral neuropathy as well as nonhealing foot wounds and previous left 2nd toe ray amputation who presented to the ED with body aches, generalized weakness and fever as well as increased redness across right foot with some drainage.   Assessment & Plan: Active Problems:   Cellulitis of right leg  Right foot cellulitis: Without bony erosions on XR performed at admission. Personally reviewe this morning with the patient, showing no bony abnormalities/edema. There is some expressible pus on exam.  - Given fever, suspect this could be osteomyelitis not detected on XR. Check MRI.  - Continue vancomycin, ceftriaxone empirically. Given concern for anaerobic involvement/diabetes/neuropathy will add flagyl with plans to convert to oral agent(s) in next 24 hours if no fever, abscess or osteo noted on MRI.  - Monitor blood cultures  T2DM: Has always been prediabetic in the past per pt.  - Continued home glipizide at admission. - HbA1c 6.9% is diagnostic for diabetes. Will consult diabetes coordinator for assistance with patient education. - SSI as ordered.  HTN:  - Holding lisinopril, HCTZ due to concern for infection. BP wnl.  Hypoxia while sleeping:  - Recommend sleep study as outpatient  Hypokalemia:  - Supplementd, monitor at follow up  Obesity:  - Weight loss recommended  ADHD:  - Continue adderall  DVT prophylaxis: Lovenox 0.5mg /kg Code Status: Full Family Communication: None at bedside Disposition Plan: Pending clinical progress. The patient is at very high risk of poor outcome due to neuropathy, diabetes, and history of amputation.   Consultants:   None  Procedures:   None  Antimicrobials:  Ancef 3/1  Ceftriaxone, vancomycin 3/1 >>   Flagyl 3/2  >>  Subjective: No fevers currently, no pain. Still feels generally weaker than usual.  Objective: Vitals:   01/02/19 1121 01/02/19 1200 01/02/19 1400 01/02/19 1629  BP: 122/78 107/90 125/77 125/80  Pulse: 72 90 85 86  Resp: 18 15 16 18   Temp:    98 F (36.7 C)  TempSrc:    Oral  SpO2: 97% 95% 96% 96%  Weight:      Height:        Intake/Output Summary (Last 24 hours) at 01/02/2019 1632 Last data filed at 01/02/2019 1345 Gross per 24 hour  Intake 5187.66 ml  Output 900 ml  Net 4287.66 ml   Filed Weights   01/01/19 1407  Weight: 108.9 kg    Gen: 48 y.o. male in no distress  Pulm: Non-labored breathing room air. Clear to auscultation bilaterally.  CV: Regular rate and rhythm. No murmur, rub, or gallop. No JVD. GI: Abdomen soft, non-tender, non-distended, with normoactive bowel sounds. No organomegaly or masses felt. Ext: Left foot with nonpitting edema, 2nd toe surgically absent. Right foot with lateral plantar wound with significant callus, nondraining, and further laterally to 5th MT head is more superficial blister with sanguinous/purulent/malodorous exudate expressible. Decreased sensation bilaterally. Skin: As above Neuro: Alert and oriented. As above, no other focal neurological deficits. Psych: Judgement and insight appear normal. Mood & affect appropriate.   Data Reviewed: I have personally reviewed following labs and imaging studies  CBC: Recent Labs  Lab 01/01/19 1518 01/01/19 1902 01/02/19 0510  WBC 7.8 8.2 8.1  NEUTROABS 6.1 5.6  --   HGB 14.5 13.4 12.9*  HCT 44.8 41.6 40.4  MCV 100.4* 98.3 99.8  PLT 250 234 989   Basic Metabolic Panel: Recent Labs  Lab 01/01/19 1518 01/02/19 0510  NA 137 137  K 3.8 3.4*  CL 97* 101  CO2 31 27  GLUCOSE 270* 176*  BUN 10 10  CREATININE 1.01 0.70  CALCIUM 8.8* 8.2*   GFR: Estimated Creatinine Clearance: 149 mL/min (by C-G formula based on SCr of 0.7 mg/dL). Liver Function Tests: No results for input(s): AST,  ALT, ALKPHOS, BILITOT, PROT, ALBUMIN in the last 168 hours. No results for input(s): LIPASE, AMYLASE in the last 168 hours. No results for input(s): AMMONIA in the last 168 hours. Coagulation Profile: No results for input(s): INR, PROTIME in the last 168 hours. Cardiac Enzymes: No results for input(s): CKTOTAL, CKMB, CKMBINDEX, TROPONINI in the last 168 hours. BNP (last 3 results) No results for input(s): PROBNP in the last 8760 hours. HbA1C: Recent Labs    01/01/19 1857  HGBA1C 6.9*   CBG: Recent Labs  Lab 01/02/19 0731 01/02/19 1251  GLUCAP 168* 158*   Lipid Profile: No results for input(s): CHOL, HDL, LDLCALC, TRIG, CHOLHDL, LDLDIRECT in the last 72 hours. Thyroid Function Tests: No results for input(s): TSH, T4TOTAL, FREET4, T3FREE, THYROIDAB in the last 72 hours. Anemia Panel: No results for input(s): VITAMINB12, FOLATE, FERRITIN, TIBC, IRON, RETICCTPCT in the last 72 hours. Urine analysis:    Component Value Date/Time   COLORURINE YELLOW 11/11/2017 0013   APPEARANCEUR HAZY (A) 11/11/2017 0013   LABSPEC 1.017 11/11/2017 0013   PHURINE 5.0 11/11/2017 0013   GLUCOSEU 150 (A) 11/11/2017 0013   HGBUR LARGE (A) 11/11/2017 0013   BILIRUBINUR NEGATIVE 11/11/2017 0013   KETONESUR NEGATIVE 11/11/2017 0013   PROTEINUR 30 (A) 11/11/2017 0013   NITRITE NEGATIVE 11/11/2017 0013   LEUKOCYTESUR NEGATIVE 11/11/2017 0013   Recent Results (from the past 240 hour(s))  Blood Culture (routine x 2)     Status: None (Preliminary result)   Collection Time: 01/01/19  6:57 PM  Result Value Ref Range Status   Specimen Description BLOOD RIGHT ARM  Final   Special Requests   Final    BOTTLES DRAWN AEROBIC AND ANAEROBIC Blood Culture adequate volume   Culture   Final    NO GROWTH < 24 HOURS Performed at [facility], [address], [place], [place] [address]    Report Status PENDING  Incomplete  Blood Culture (routine x 2)     Status: None (Preliminary result)   Collection Time:  01/01/19  7:03 PM  Result Value Ref Range Status   Specimen Description BLOOD LEFT ARM  Final   Special Requests   Final    BOTTLES DRAWN AEROBIC AND ANAEROBIC Blood Culture adequate volume   Culture   Final    NO GROWTH < 24 HOURS Performed at [facility], [address], [place], [place] [address]    Report Status PENDING  Incomplete      Radiology Studies: Dg Chest 2 View  Result Date: 01/01/2019 CLINICAL DATA:  Increased weakness.  Fever and chills. EXAM: CHEST - 2 VIEW COMPARISON:  None. FINDINGS: Minimal atelectasis in the bases. The heart, hila, mediastinum, lungs, and pleura are otherwise normal. IMPRESSION: No active cardiopulmonary disease. Electronically Signed   By: Dorise Bullion III M.D   On: 01/01/2019 19:12   Mr Foot Right Wo Contrast  Result Date: 01/02/2019 CLINICAL DATA:  Diabetic with foot swelling and open wound at the head of the 5th metatarsal. History of MRSA. Osteomyelitis suspected. EXAM:  MRI OF THE RIGHT FOREFOOT WITHOUT CONTRAST TECHNIQUE: Multiplanar, multisequence MR imaging of the right forefoot was performed. No intravenous contrast was administered. COMPARISON:  Radiographs 01/01/2019 FINDINGS: Despite efforts by the technologist and patient, moderate motion artifact is present on today's exam and could not be eliminated. This reduces exam sensitivity and specificity. Motion is greatest on the sagittal images which are nearly nondiagnostic. Bones/Joint/Cartilage There is incomplete fat saturation in the lateral toes on the T2 weighted images. T1 weighted images demonstrate no cortical destruction or abnormal marrow signal. No significant joint effusions or arthropathy. The alignment is normal at the Lisfranc joint. Ligaments The Lisfranc ligament is intact. Muscles and Tendons Generalized forefoot muscular atrophy. No significant tenosynovitis. Soft tissues There is dermal thickening with decreased T1 signal in the subcutaneous fat along the plantar and lateral  aspect of the 5th metatarsophalangeal joint at the site of the patient's presumed ulcer. No focal fluid collection is apparent. There is mild subcutaneous edema dorsally in the forefoot. IMPRESSION: 1. Nonspecific inflammatory changes laterally in the forefoot. No evidence of soft tissue abscess. 2. No evidence of osteomyelitis or septic joint. Electronically Signed   By: Richardean Sale M.D.   On: 01/02/2019 09:58   Dg Foot Complete Right  Result Date: 01/01/2019 CLINICAL DATA:  Ulcers in both feet. EXAM: RIGHT FOOT COMPLETE - 3+ VIEW COMPARISON:  None. FINDINGS: Possible soft tissue swelling in the lateral foot overlying the proximal fifth digit and distal fifth metatarsal. No air in the soft tissues. No underlying bony erosion. No fractures. No other acute abnormalities identified. IMPRESSION: I believe the patient's ulcer is likely in the region of the fifth MTP joint with no soft tissue gas or bony erosion noted. Recommend clinical correlation. Electronically Signed   By: Dorise Bullion III M.D   On: 01/01/2019 19:14    Scheduled Meds: . amphetamine-dextroamphetamine  10 mg Oral Daily  . glipiZIDE  5 mg Oral QAC breakfast  . insulin aspart  0-9 Units Subcutaneous TID WC   Continuous Infusions: . 0.9 % NaCl with KCl 20 mEq / L 100 mL/hr at 01/02/19 0721  . cefTRIAXone (ROCEPHIN)  IV Stopped (01/01/19 2234)  . vancomycin Stopped (01/02/19 1345)     LOS: 1 day   Time spent: 35 minutes.  Patrecia Pour, MD [facility] [email or web address] Password [facility] 01/02/2019, 4:32 PM

## 2019-01-02 NOTE — ED Notes (Signed)
Pt placed on hospital bed for patient comfort

## 2019-01-02 NOTE — ED Notes (Signed)
Ambulating in hall without assistance, tolerating well.

## 2019-01-02 NOTE — Progress Notes (Signed)
Patient has an order for CPAP at [facility] but has refused. He stated he didn't order one and didn't need one tonight. I offered to provide 2 LPM [place] for patient while he sleep and he also refused that as well. RT will have CPAP on standby for use if needed.

## 2019-01-02 NOTE — ED Notes (Signed)
Pts Sats drops to 70-80's during sleep.

## 2019-01-02 NOTE — ED Notes (Signed)
I has not eaten breakfast.  Will give meds following testing.

## 2019-01-03 DIAGNOSIS — R7303 Prediabetes: Secondary | ICD-10-CM

## 2019-01-03 LAB — HIV ANTIBODY (ROUTINE TESTING W REFLEX): HIV Screen 4th Generation wRfx: NONREACTIVE

## 2019-01-03 LAB — GLUCOSE, CAPILLARY: Glucose-Capillary: 151 mg/dL — ABNORMAL HIGH (ref 70–99)

## 2019-01-03 MED ORDER — GLIPIZIDE ER 5 MG PO TB24: 5.0000 mg | ORAL_TABLET | Freq: Every day | ORAL | 0 refills | Status: AC

## 2019-01-03 MED ORDER — CLINDAMYCIN HCL 300 MG PO CAPS: 300.0000 mg | ORAL_CAPSULE | Freq: Three times a day (TID) | ORAL | 0 refills | Status: DC

## 2019-01-03 NOTE — Progress Notes (Signed)
IV removed, WNL. D.C instructions given to pt, verbalized understanding. Pt awaiting spouse to transport home.

## 2019-01-03 NOTE — Discharge Summary (Signed)
Physician Discharge Summary  Johnny Martinez DGL:[id number] DOB: [birth year] DOA: 01/01/2019  PCP: Celene Squibb, MD  Admit date: 01/01/2019 Discharge date: 01/03/2019  Admitted From: Home Disposition: Home   Recommendations for Outpatient Follow-up:  1. Follow up with PCP in 1-2 weeks, needs further education and management of diabetes 2. Please follow up on the following pending results: blood cultures (NGTD at DC) 3. Recommend sleep study as an outpatient 4. Consider recheck BMP and CBC at follow up  Dike: None Equipment/Devices: None Discharge Condition: Stable CODE STATUS: Full Diet recommendation: Heart healthy-carb modified  Brief/Interim Summary: Johnny Martinez is a 48 y.o. male with a history of prediabetes, HTN, ADHD, anxiety, depression, and peripheral neuropathy as well as nonhealing foot wounds and previous left 2nd toe ray amputation who presented to the ED with body aches, generalized weakness and fever as well as increased redness across right foot with some drainage. XR was not diagnostic and subsequent MRI did not demonstrate abscess or osteomyelitis. Broad antibiotics were continued with subjective improvement. These will be converted to clindamycin for strep, staph (hx MRSA), and anaerobic coverage.   Discharge Diagnoses:  Active Problems:   Cellulitis of right leg  Diabetic foot ulcer secondary to diabetic neuropathy:  - Continue local wound care, would benefit from diabetic shoes and wound care follow up. Defer this to PCP.   Right foot cellulitis: There is some expressible pus on exam and no deep abscess on MRI. No osteomyelitis.  - Will convert vancomycin, ceftriaxone, flagyl to clindamycin (has been effective for patient in the past, has broad coverage) - Monitor blood cultures (NGTD at discharge)  T2DM: Has always been prediabetic in the past per pt.  - Continued home glipizide. Has not been taking this, so gave new prescription - HbA1c 6.9% is  diagnostic for diabetes. Consulted diabetes coordinator for assistance with patient education. Needs close PCP follow up  HTN:  - Restart home medications  Hypoxia while sleeping:  - Recommend sleep study as outpatient  Hypokalemia:  - Supplemented, monitor at follow up  Obesity:  - Weight loss recommended  ADHD:  - Continue adderall  Discharge Instructions Discharge Instructions    Diet - low sodium heart healthy   Complete by:  As directed    Diet Carb Modified   Complete by:  As directed    Discharge instructions   Complete by:  As directed    You were admitted for skin and soft tissue infection of the right foot and have shown improvement with broad spectrum antibiotics. The MRI showed no abscess or infection in the bone (osteomyelitis). You are stable for discharge with the following recommendations:  - Continue antibiotics by taking clindamycin for 9 more days as directed. Eat plenty of cultured yogurt to minimize chances of diarrhea.  - You may take cough suppressant medication over the counter that contains dextromethorphan. If your cough worsens or you develop chest pain or shortness of breath, seek medical attention - Follow up with your doctor in the next 1-2 weeks for follow up of cellulitis and for management of diabetes, or seek medical attention sooner if the redness worsens. Keep the area very clean with soap and water. - Restart taking glipizide for diabetes. Your Hemoglobin A1c was 6.9%. You will need ongoing management with your PCP for this as well.   Increase activity slowly   Complete by:  As directed      Allergies as of 01/03/2019      Reactions  Iodine Anaphylaxis   Shellfish Allergy Anaphylaxis      Medication List    TAKE these medications   amphetamine-dextroamphetamine 10 MG 24 hr capsule Commonly known as:  ADDERALL XR Take 10 mg by mouth daily.   clindamycin 300 MG capsule Commonly known as:  CLEOCIN Take 1 capsule (300 mg total) by  mouth 3 (three) times daily.   GINSENG PO Take 1 tablet by mouth daily.   glipiZIDE 5 MG 24 hr tablet Commonly known as:  GLUCOTROL XL Take 1 tablet (5 mg total) by mouth daily.   HYDROcodone-acetaminophen 5-325 MG tablet Commonly known as:  NORCO/VICODIN Take 0.5 tablets by mouth daily as needed for moderate pain. What changed:  how much to take   lisinopril-hydrochlorothiazide 20-12.5 MG tablet Commonly known as:  PRINZIDE,ZESTORETIC Take 1 tablet by mouth daily.      Follow-up Information    Celene Squibb, MD. Schedule an appointment as soon as possible for a visit in 1 week(s).   Specialty:  Internal Medicine Contact information: [place] [address] [phone]          Allergies  Allergen Reactions  . Iodine Anaphylaxis  . Shellfish Allergy Anaphylaxis    Consultations:  None  Procedures/Studies: Dg Chest 2 View  Result Date: 01/01/2019 CLINICAL DATA:  Increased weakness.  Fever and chills. EXAM: CHEST - 2 VIEW COMPARISON:  None. FINDINGS: Minimal atelectasis in the bases. The heart, hila, mediastinum, lungs, and pleura are otherwise normal. IMPRESSION: No active cardiopulmonary disease. Electronically Signed   By: Dorise Bullion III M.D   On: 01/01/2019 19:12   Mr Foot Right Wo Contrast  Result Date: 01/02/2019 CLINICAL DATA:  Diabetic with foot swelling and open wound at the head of the 5th metatarsal. History of MRSA. Osteomyelitis suspected. EXAM: MRI OF THE RIGHT FOREFOOT WITHOUT CONTRAST TECHNIQUE: Multiplanar, multisequence MR imaging of the right forefoot was performed. No intravenous contrast was administered. COMPARISON:  Radiographs 01/01/2019 FINDINGS: Despite efforts by the technologist and patient, moderate motion artifact is present on today's exam and could not be eliminated. This reduces exam sensitivity and specificity. Motion is greatest on the sagittal images which are nearly nondiagnostic. Bones/Joint/Cartilage There is  incomplete fat saturation in the lateral toes on the T2 weighted images. T1 weighted images demonstrate no cortical destruction or abnormal marrow signal. No significant joint effusions or arthropathy. The alignment is normal at the Lisfranc joint. Ligaments The Lisfranc ligament is intact. Muscles and Tendons Generalized forefoot muscular atrophy. No significant tenosynovitis. Soft tissues There is dermal thickening with decreased T1 signal in the subcutaneous fat along the plantar and lateral aspect of the 5th metatarsophalangeal joint at the site of the patient's presumed ulcer. No focal fluid collection is apparent. There is mild subcutaneous edema dorsally in the forefoot. IMPRESSION: 1. Nonspecific inflammatory changes laterally in the forefoot. No evidence of soft tissue abscess. 2. No evidence of osteomyelitis or septic joint. Electronically Signed   By: Richardean Sale M.D.   On: 01/02/2019 09:58   Dg Foot Complete Right  Result Date: 01/01/2019 CLINICAL DATA:  Ulcers in both feet. EXAM: RIGHT FOOT COMPLETE - 3+ VIEW COMPARISON:  None. FINDINGS: Possible soft tissue swelling in the lateral foot overlying the proximal fifth digit and distal fifth metatarsal. No air in the soft tissues. No underlying bony erosion. No fractures. No other acute abnormalities identified. IMPRESSION: I believe the patient's ulcer is likely in the region of the fifth MTP joint with no soft  tissue gas or bony erosion noted. Recommend clinical correlation. Electronically Signed   By: Dorise Bullion III M.D   On: 01/01/2019 19:14       Subjective: Feels better, ambulating and no fevers. Has had success with clindamycin without severe diarrhea in the past. Will eat lots of yogurt. No change in wound.  Discharge Exam: Vitals:   01/02/19 2130 01/03/19 0529  BP: 114/70 123/72  Pulse: 77 74  Resp:  18  Temp: 98.4 F (36.9 C) 98 F (36.7 C)  SpO2: 98% 95%   General: Pt is alert, awake, not in acute  distress Cardiovascular: RRR, S1/S2 +, no rubs, no gallops Respiratory: CTA bilaterally, no wheezing, no rhonchi Abdominal: Soft, NT, ND, bowel sounds + Extremities: Right foot with dorsal erythema ill-defined spreading from draining blister on lateral aspect near 5th MT head. Plantar ulcer with callus not open. No fluctuance, improved induration from prior exam.   Labs: BNP (last 3 results) No results for input(s): BNP in the last 8760 hours. Basic Metabolic Panel: Recent Labs  Lab 01/01/19 1518 01/02/19 0510  NA 137 137  K 3.8 3.4*  CL 97* 101  CO2 31 27  GLUCOSE 270* 176*  BUN 10 10  CREATININE 1.01 0.70  CALCIUM 8.8* 8.2*   Liver Function Tests: No results for input(s): AST, ALT, ALKPHOS, BILITOT, PROT, ALBUMIN in the last 168 hours. No results for input(s): LIPASE, AMYLASE in the last 168 hours. No results for input(s): AMMONIA in the last 168 hours. CBC: Recent Labs  Lab 01/01/19 1518 01/01/19 1902 01/02/19 0510  WBC 7.8 8.2 8.1  NEUTROABS 6.1 5.6  --   HGB 14.5 13.4 12.9*  HCT 44.8 41.6 40.4  MCV 100.4* 98.3 99.8  PLT 250 234 231   Cardiac Enzymes: No results for input(s): CKTOTAL, CKMB, CKMBINDEX, TROPONINI in the last 168 hours. BNP: Invalid input(s): POCBNP CBG: Recent Labs  Lab 01/02/19 0731 01/02/19 1251 01/02/19 1632 01/02/19 2153 01/03/19 0741  GLUCAP 168* 158* 111* 185* 151*   D-Dimer No results for input(s): DDIMER in the last 72 hours. Hgb A1c Recent Labs    01/01/19 1857  HGBA1C 6.9*   Lipid Profile No results for input(s): CHOL, HDL, LDLCALC, TRIG, CHOLHDL, LDLDIRECT in the last 72 hours. Thyroid function studies No results for input(s): TSH, T4TOTAL, T3FREE, THYROIDAB in the last 72 hours.  Invalid input(s): FREET3 Anemia work up No results for input(s): VITAMINB12, FOLATE, FERRITIN, TIBC, IRON, RETICCTPCT in the last 72 hours. Urinalysis    Component Value Date/Time   COLORURINE YELLOW 11/11/2017 0013   APPEARANCEUR HAZY  (A) 11/11/2017 0013   LABSPEC 1.017 11/11/2017 0013   PHURINE 5.0 11/11/2017 0013   GLUCOSEU 150 (A) 11/11/2017 0013   HGBUR LARGE (A) 11/11/2017 0013   BILIRUBINUR NEGATIVE 11/11/2017 0013   KETONESUR NEGATIVE 11/11/2017 0013   PROTEINUR 30 (A) 11/11/2017 0013   NITRITE NEGATIVE 11/11/2017 0013   LEUKOCYTESUR NEGATIVE 11/11/2017 0013    Microbiology Recent Results (from the past 240 hour(s))  Blood Culture (routine x 2)     Status: None (Preliminary result)   Collection Time: 01/01/19  6:57 PM  Result Value Ref Range Status   Specimen Description BLOOD RIGHT ARM  Final   Special Requests   Final    BOTTLES DRAWN AEROBIC AND ANAEROBIC Blood Culture adequate volume   Culture   Final    NO GROWTH 2 DAYS Performed at [facility], [address], [place], [place] [address]    Report  Status PENDING  Incomplete  Blood Culture (routine x 2)     Status: None (Preliminary result)   Collection Time: 01/01/19  7:03 PM  Result Value Ref Range Status   Specimen Description BLOOD LEFT ARM  Final   Special Requests   Final    BOTTLES DRAWN AEROBIC AND ANAEROBIC Blood Culture adequate volume   Culture   Final    NO GROWTH 2 DAYS Performed at [facility], [address], [place], [place] [address]    Report Status PENDING  Incomplete    Time coordinating discharge: Approximately 40 minutes  Patrecia Pour, MD  [facility] 01/03/2019, 8:13 AM Pager [phone]

## 2019-01-06 LAB — CULTURE, BLOOD (ROUTINE X 2)
CULTURE: NO GROWTH
Culture: NO GROWTH
Special Requests: ADEQUATE
Special Requests: ADEQUATE

## 2019-02-01 DIAGNOSIS — F909 Attention-deficit hyperactivity disorder, unspecified type: Secondary | ICD-10-CM | POA: Diagnosis not present

## 2019-02-01 DIAGNOSIS — R600 Localized edema: Secondary | ICD-10-CM | POA: Diagnosis not present

## 2019-02-01 DIAGNOSIS — E785 Hyperlipidemia, unspecified: Secondary | ICD-10-CM | POA: Diagnosis not present

## 2019-02-01 DIAGNOSIS — R7301 Impaired fasting glucose: Secondary | ICD-10-CM | POA: Diagnosis not present

## 2019-06-23 DIAGNOSIS — L03115 Cellulitis of right lower limb: Secondary | ICD-10-CM | POA: Diagnosis not present

## 2019-07-25 DIAGNOSIS — E785 Hyperlipidemia, unspecified: Secondary | ICD-10-CM | POA: Diagnosis not present

## 2019-07-25 DIAGNOSIS — I10 Essential (primary) hypertension: Secondary | ICD-10-CM | POA: Diagnosis not present

## 2019-07-25 DIAGNOSIS — E782 Mixed hyperlipidemia: Secondary | ICD-10-CM | POA: Diagnosis not present

## 2019-07-25 DIAGNOSIS — E1169 Type 2 diabetes mellitus with other specified complication: Secondary | ICD-10-CM | POA: Diagnosis not present

## 2019-08-04 DIAGNOSIS — Z0000 Encounter for general adult medical examination without abnormal findings: Secondary | ICD-10-CM | POA: Diagnosis not present

## 2019-08-04 DIAGNOSIS — E1159 Type 2 diabetes mellitus with other circulatory complications: Secondary | ICD-10-CM | POA: Diagnosis not present

## 2019-08-04 DIAGNOSIS — E782 Mixed hyperlipidemia: Secondary | ICD-10-CM | POA: Diagnosis not present

## 2019-08-04 DIAGNOSIS — E11621 Type 2 diabetes mellitus with foot ulcer: Secondary | ICD-10-CM | POA: Diagnosis not present

## 2019-08-18 DIAGNOSIS — S91301D Unspecified open wound, right foot, subsequent encounter: Secondary | ICD-10-CM | POA: Diagnosis not present

## 2019-08-18 DIAGNOSIS — Z23 Encounter for immunization: Secondary | ICD-10-CM | POA: Diagnosis not present

## 2019-08-18 DIAGNOSIS — L89893 Pressure ulcer of other site, stage 3: Secondary | ICD-10-CM | POA: Diagnosis not present

## 2019-08-18 DIAGNOSIS — R600 Localized edema: Secondary | ICD-10-CM | POA: Diagnosis not present

## 2019-08-30 DIAGNOSIS — R600 Localized edema: Secondary | ICD-10-CM | POA: Diagnosis not present

## 2019-09-08 DIAGNOSIS — Z23 Encounter for immunization: Secondary | ICD-10-CM | POA: Diagnosis not present

## 2019-09-08 DIAGNOSIS — Z6837 Body mass index (BMI) 37.0-37.9, adult: Secondary | ICD-10-CM | POA: Diagnosis not present

## 2019-09-08 DIAGNOSIS — R05 Cough: Secondary | ICD-10-CM | POA: Diagnosis not present

## 2019-09-08 DIAGNOSIS — L89893 Pressure ulcer of other site, stage 3: Secondary | ICD-10-CM | POA: Diagnosis not present

## 2019-09-08 DIAGNOSIS — S91302D Unspecified open wound, left foot, subsequent encounter: Secondary | ICD-10-CM | POA: Diagnosis not present

## 2019-09-08 DIAGNOSIS — Z89429 Acquired absence of other toe(s), unspecified side: Secondary | ICD-10-CM | POA: Diagnosis not present

## 2019-09-08 DIAGNOSIS — R600 Localized edema: Secondary | ICD-10-CM | POA: Diagnosis not present

## 2019-09-08 DIAGNOSIS — S91301D Unspecified open wound, right foot, subsequent encounter: Secondary | ICD-10-CM | POA: Diagnosis not present

## 2019-09-20 DIAGNOSIS — S91302D Unspecified open wound, left foot, subsequent encounter: Secondary | ICD-10-CM | POA: Diagnosis not present

## 2019-09-20 DIAGNOSIS — L89893 Pressure ulcer of other site, stage 3: Secondary | ICD-10-CM | POA: Diagnosis not present

## 2019-09-20 DIAGNOSIS — S91301D Unspecified open wound, right foot, subsequent encounter: Secondary | ICD-10-CM | POA: Diagnosis not present

## 2019-09-20 DIAGNOSIS — R600 Localized edema: Secondary | ICD-10-CM | POA: Diagnosis not present

## 2019-10-02 DIAGNOSIS — R600 Localized edema: Secondary | ICD-10-CM | POA: Diagnosis not present

## 2019-10-02 DIAGNOSIS — S91301D Unspecified open wound, right foot, subsequent encounter: Secondary | ICD-10-CM | POA: Diagnosis not present

## 2019-10-02 DIAGNOSIS — S91302D Unspecified open wound, left foot, subsequent encounter: Secondary | ICD-10-CM | POA: Diagnosis not present

## 2019-10-02 DIAGNOSIS — L89893 Pressure ulcer of other site, stage 3: Secondary | ICD-10-CM | POA: Diagnosis not present

## 2019-11-09 DIAGNOSIS — M869 Osteomyelitis, unspecified: Secondary | ICD-10-CM | POA: Diagnosis not present

## 2019-11-09 DIAGNOSIS — M86172 Other acute osteomyelitis, left ankle and foot: Secondary | ICD-10-CM | POA: Diagnosis not present

## 2019-11-09 DIAGNOSIS — E11621 Type 2 diabetes mellitus with foot ulcer: Secondary | ICD-10-CM | POA: Diagnosis not present

## 2019-11-09 DIAGNOSIS — L97529 Non-pressure chronic ulcer of other part of left foot with unspecified severity: Secondary | ICD-10-CM | POA: Diagnosis not present

## 2019-11-09 DIAGNOSIS — L97521 Non-pressure chronic ulcer of other part of left foot limited to breakdown of skin: Secondary | ICD-10-CM | POA: Diagnosis not present

## 2019-11-09 DIAGNOSIS — L97519 Non-pressure chronic ulcer of other part of right foot with unspecified severity: Secondary | ICD-10-CM | POA: Diagnosis not present

## 2019-11-09 DIAGNOSIS — L97509 Non-pressure chronic ulcer of other part of unspecified foot with unspecified severity: Secondary | ICD-10-CM | POA: Diagnosis not present

## 2019-11-13 DIAGNOSIS — L97519 Non-pressure chronic ulcer of other part of right foot with unspecified severity: Secondary | ICD-10-CM | POA: Diagnosis not present

## 2019-11-13 DIAGNOSIS — E11621 Type 2 diabetes mellitus with foot ulcer: Secondary | ICD-10-CM | POA: Diagnosis not present

## 2019-11-13 DIAGNOSIS — L97521 Non-pressure chronic ulcer of other part of left foot limited to breakdown of skin: Secondary | ICD-10-CM | POA: Diagnosis not present

## 2019-11-13 DIAGNOSIS — L97509 Non-pressure chronic ulcer of other part of unspecified foot with unspecified severity: Secondary | ICD-10-CM | POA: Diagnosis not present

## 2019-11-13 DIAGNOSIS — M86472 Chronic osteomyelitis with draining sinus, left ankle and foot: Secondary | ICD-10-CM | POA: Diagnosis not present

## 2019-11-16 DIAGNOSIS — L97509 Non-pressure chronic ulcer of other part of unspecified foot with unspecified severity: Secondary | ICD-10-CM | POA: Diagnosis not present

## 2019-11-29 DIAGNOSIS — M86472 Chronic osteomyelitis with draining sinus, left ankle and foot: Secondary | ICD-10-CM | POA: Diagnosis not present

## 2019-11-29 DIAGNOSIS — L97521 Non-pressure chronic ulcer of other part of left foot limited to breakdown of skin: Secondary | ICD-10-CM | POA: Diagnosis not present

## 2019-11-29 DIAGNOSIS — Z20822 Contact with and (suspected) exposure to covid-19: Secondary | ICD-10-CM | POA: Diagnosis not present

## 2019-12-01 DIAGNOSIS — Z7984 Long term (current) use of oral hypoglycemic drugs: Secondary | ICD-10-CM | POA: Diagnosis not present

## 2019-12-01 DIAGNOSIS — M86472 Chronic osteomyelitis with draining sinus, left ankle and foot: Secondary | ICD-10-CM | POA: Diagnosis not present

## 2019-12-01 DIAGNOSIS — M86672 Other chronic osteomyelitis, left ankle and foot: Secondary | ICD-10-CM | POA: Diagnosis not present

## 2019-12-01 DIAGNOSIS — L97524 Non-pressure chronic ulcer of other part of left foot with necrosis of bone: Secondary | ICD-10-CM | POA: Diagnosis not present

## 2019-12-01 DIAGNOSIS — E1142 Type 2 diabetes mellitus with diabetic polyneuropathy: Secondary | ICD-10-CM | POA: Diagnosis not present

## 2019-12-01 DIAGNOSIS — F17210 Nicotine dependence, cigarettes, uncomplicated: Secondary | ICD-10-CM | POA: Diagnosis not present

## 2019-12-01 DIAGNOSIS — G2581 Restless legs syndrome: Secondary | ICD-10-CM | POA: Diagnosis not present

## 2019-12-01 DIAGNOSIS — G8918 Other acute postprocedural pain: Secondary | ICD-10-CM | POA: Diagnosis not present

## 2019-12-01 DIAGNOSIS — L97521 Non-pressure chronic ulcer of other part of left foot limited to breakdown of skin: Secondary | ICD-10-CM | POA: Diagnosis not present

## 2019-12-01 DIAGNOSIS — F909 Attention-deficit hyperactivity disorder, unspecified type: Secondary | ICD-10-CM | POA: Diagnosis not present

## 2019-12-01 DIAGNOSIS — Z79899 Other long term (current) drug therapy: Secondary | ICD-10-CM | POA: Diagnosis not present

## 2019-12-01 DIAGNOSIS — G4730 Sleep apnea, unspecified: Secondary | ICD-10-CM | POA: Diagnosis not present

## 2019-12-01 DIAGNOSIS — E1169 Type 2 diabetes mellitus with other specified complication: Secondary | ICD-10-CM | POA: Diagnosis not present

## 2019-12-01 DIAGNOSIS — E11621 Type 2 diabetes mellitus with foot ulcer: Secondary | ICD-10-CM | POA: Diagnosis not present

## 2019-12-01 DIAGNOSIS — I10 Essential (primary) hypertension: Secondary | ICD-10-CM | POA: Diagnosis not present

## 2019-12-01 DIAGNOSIS — M86172 Other acute osteomyelitis, left ankle and foot: Secondary | ICD-10-CM | POA: Diagnosis not present

## 2019-12-01 DIAGNOSIS — L97529 Non-pressure chronic ulcer of other part of left foot with unspecified severity: Secondary | ICD-10-CM | POA: Diagnosis not present

## 2019-12-01 DIAGNOSIS — F419 Anxiety disorder, unspecified: Secondary | ICD-10-CM | POA: Diagnosis not present

## 2019-12-11 DIAGNOSIS — Z89432 Acquired absence of left foot: Secondary | ICD-10-CM | POA: Diagnosis not present

## 2019-12-25 DIAGNOSIS — E11621 Type 2 diabetes mellitus with foot ulcer: Secondary | ICD-10-CM | POA: Diagnosis not present

## 2019-12-25 DIAGNOSIS — L97521 Non-pressure chronic ulcer of other part of left foot limited to breakdown of skin: Secondary | ICD-10-CM | POA: Diagnosis not present

## 2019-12-25 DIAGNOSIS — Z89432 Acquired absence of left foot: Secondary | ICD-10-CM | POA: Diagnosis not present

## 2019-12-25 DIAGNOSIS — L97509 Non-pressure chronic ulcer of other part of unspecified foot with unspecified severity: Secondary | ICD-10-CM | POA: Diagnosis not present

## 2019-12-31 ENCOUNTER — Ambulatory Visit: Payer: BC Managed Care – PPO | Attending: Internal Medicine

## 2019-12-31 DIAGNOSIS — Z23 Encounter for immunization: Secondary | ICD-10-CM | POA: Insufficient documentation

## 2019-12-31 NOTE — Progress Notes (Signed)
   Covid-19 Vaccination Clinic  Name:  Johnny Martinez    MRN: [id number] DOB: Dec 17, 1970  12/31/2019  [name] was observed post Covid-19 immunization for 30 minutes based on pre-vaccination screening without incidence. He was provided with Vaccine Information Sheet and instruction to access the V-Safe system.   [name] was instructed to call 911 with any severe reactions post vaccine: Marland Kitchen Difficulty breathing  . Swelling of your face and throat  . A fast heartbeat  . A bad rash all over your body  . Dizziness and weakness    Immunizations Administered    Name Date Dose VIS Date Route   Moderna COVID-19 Vaccine 12/31/2019  5:10 PM 0.5 mL 10/03/2019 Intramuscular   Manufacturer: Moderna   Lot: [id number]   [place]:[id number]

## 2020-01-01 DIAGNOSIS — E1159 Type 2 diabetes mellitus with other circulatory complications: Secondary | ICD-10-CM | POA: Diagnosis not present

## 2020-01-01 DIAGNOSIS — Z89412 Acquired absence of left great toe: Secondary | ICD-10-CM | POA: Diagnosis not present

## 2020-01-01 DIAGNOSIS — I739 Peripheral vascular disease, unspecified: Secondary | ICD-10-CM | POA: Diagnosis not present

## 2020-01-01 DIAGNOSIS — Z125 Encounter for screening for malignant neoplasm of prostate: Secondary | ICD-10-CM | POA: Diagnosis not present

## 2020-01-01 DIAGNOSIS — E1169 Type 2 diabetes mellitus with other specified complication: Secondary | ICD-10-CM | POA: Diagnosis not present

## 2020-01-01 DIAGNOSIS — I10 Essential (primary) hypertension: Secondary | ICD-10-CM | POA: Diagnosis not present

## 2020-01-01 DIAGNOSIS — E11621 Type 2 diabetes mellitus with foot ulcer: Secondary | ICD-10-CM | POA: Diagnosis not present

## 2020-01-05 DIAGNOSIS — E1159 Type 2 diabetes mellitus with other circulatory complications: Secondary | ICD-10-CM | POA: Diagnosis not present

## 2020-01-05 DIAGNOSIS — E782 Mixed hyperlipidemia: Secondary | ICD-10-CM | POA: Diagnosis not present

## 2020-01-05 DIAGNOSIS — D72829 Elevated white blood cell count, unspecified: Secondary | ICD-10-CM | POA: Diagnosis not present

## 2020-01-05 DIAGNOSIS — E11621 Type 2 diabetes mellitus with foot ulcer: Secondary | ICD-10-CM | POA: Diagnosis not present

## 2020-01-15 DIAGNOSIS — G629 Polyneuropathy, unspecified: Secondary | ICD-10-CM | POA: Diagnosis not present

## 2020-01-15 DIAGNOSIS — L97502 Non-pressure chronic ulcer of other part of unspecified foot with fat layer exposed: Secondary | ICD-10-CM | POA: Diagnosis not present

## 2020-01-15 DIAGNOSIS — L97521 Non-pressure chronic ulcer of other part of left foot limited to breakdown of skin: Secondary | ICD-10-CM | POA: Diagnosis not present

## 2020-02-03 ENCOUNTER — Ambulatory Visit: Payer: BC Managed Care – PPO | Attending: Internal Medicine

## 2020-02-03 DIAGNOSIS — Z23 Encounter for immunization: Secondary | ICD-10-CM

## 2020-02-03 NOTE — Progress Notes (Signed)
   Covid-19 Vaccination Clinic  Name:  Johnny Martinez    MRN: [id number] DOB: 1971/06/18  02/03/2020  Mr. Decleene was observed post Covid-19 immunization for 30 minutes based on pre-vaccination screening without incident. He was provided with Vaccine Information Sheet and instruction to access the V-Safe system.   Mr. Leddon was instructed to call 911 with any severe reactions post vaccine: Marland Kitchen Difficulty breathing  . Swelling of face and throat  . A fast heartbeat  . A bad rash all over body  . Dizziness and weakness   Immunizations Administered    Name Date Dose VIS Date Route   Moderna COVID-19 Vaccine 02/03/2020 11:38 AM 0.5 mL 10/03/2019 Intramuscular   Manufacturer: Moderna   Lot: [id number]   [id number]

## 2020-02-05 DIAGNOSIS — L97521 Non-pressure chronic ulcer of other part of left foot limited to breakdown of skin: Secondary | ICD-10-CM | POA: Diagnosis not present

## 2020-02-05 DIAGNOSIS — L97512 Non-pressure chronic ulcer of other part of right foot with fat layer exposed: Secondary | ICD-10-CM | POA: Diagnosis not present

## 2020-03-04 DIAGNOSIS — L97509 Non-pressure chronic ulcer of other part of unspecified foot with unspecified severity: Secondary | ICD-10-CM | POA: Diagnosis not present

## 2020-03-04 DIAGNOSIS — L97512 Non-pressure chronic ulcer of other part of right foot with fat layer exposed: Secondary | ICD-10-CM | POA: Diagnosis not present

## 2020-03-04 DIAGNOSIS — E11621 Type 2 diabetes mellitus with foot ulcer: Secondary | ICD-10-CM | POA: Diagnosis not present

## 2020-03-04 DIAGNOSIS — L97529 Non-pressure chronic ulcer of other part of left foot with unspecified severity: Secondary | ICD-10-CM | POA: Diagnosis not present

## 2020-04-08 DIAGNOSIS — Z79899 Other long term (current) drug therapy: Secondary | ICD-10-CM | POA: Diagnosis not present

## 2020-04-08 DIAGNOSIS — L97512 Non-pressure chronic ulcer of other part of right foot with fat layer exposed: Secondary | ICD-10-CM | POA: Diagnosis not present

## 2020-04-08 DIAGNOSIS — L97519 Non-pressure chronic ulcer of other part of right foot with unspecified severity: Secondary | ICD-10-CM | POA: Diagnosis not present

## 2020-04-08 DIAGNOSIS — G629 Polyneuropathy, unspecified: Secondary | ICD-10-CM | POA: Diagnosis not present

## 2020-04-29 DIAGNOSIS — E11621 Type 2 diabetes mellitus with foot ulcer: Secondary | ICD-10-CM | POA: Diagnosis not present

## 2020-04-29 DIAGNOSIS — E1142 Type 2 diabetes mellitus with diabetic polyneuropathy: Secondary | ICD-10-CM | POA: Diagnosis not present

## 2020-04-29 DIAGNOSIS — L97529 Non-pressure chronic ulcer of other part of left foot with unspecified severity: Secondary | ICD-10-CM | POA: Diagnosis not present

## 2020-04-29 DIAGNOSIS — L97512 Non-pressure chronic ulcer of other part of right foot with fat layer exposed: Secondary | ICD-10-CM | POA: Diagnosis not present

## 2020-05-27 DIAGNOSIS — L97529 Non-pressure chronic ulcer of other part of left foot with unspecified severity: Secondary | ICD-10-CM | POA: Diagnosis not present

## 2020-05-27 DIAGNOSIS — L97512 Non-pressure chronic ulcer of other part of right foot with fat layer exposed: Secondary | ICD-10-CM | POA: Diagnosis not present

## 2020-05-27 DIAGNOSIS — G629 Polyneuropathy, unspecified: Secondary | ICD-10-CM | POA: Diagnosis not present

## 2020-06-04 DIAGNOSIS — D72829 Elevated white blood cell count, unspecified: Secondary | ICD-10-CM | POA: Diagnosis not present

## 2020-06-04 DIAGNOSIS — R600 Localized edema: Secondary | ICD-10-CM | POA: Diagnosis not present

## 2020-06-04 DIAGNOSIS — S91302D Unspecified open wound, left foot, subsequent encounter: Secondary | ICD-10-CM | POA: Diagnosis not present

## 2020-06-04 DIAGNOSIS — R05 Cough: Secondary | ICD-10-CM | POA: Diagnosis not present

## 2020-06-04 DIAGNOSIS — L89893 Pressure ulcer of other site, stage 3: Secondary | ICD-10-CM | POA: Diagnosis not present

## 2020-06-04 DIAGNOSIS — Z6837 Body mass index (BMI) 37.0-37.9, adult: Secondary | ICD-10-CM | POA: Diagnosis not present

## 2020-06-04 DIAGNOSIS — Z89429 Acquired absence of other toe(s), unspecified side: Secondary | ICD-10-CM | POA: Diagnosis not present

## 2020-06-04 DIAGNOSIS — S91209A Unspecified open wound of unspecified toe(s) with damage to nail, initial encounter: Secondary | ICD-10-CM | POA: Diagnosis not present

## 2020-06-10 DIAGNOSIS — S91302D Unspecified open wound, left foot, subsequent encounter: Secondary | ICD-10-CM | POA: Diagnosis not present

## 2020-06-10 DIAGNOSIS — L89893 Pressure ulcer of other site, stage 3: Secondary | ICD-10-CM | POA: Diagnosis not present

## 2020-06-10 DIAGNOSIS — S91209A Unspecified open wound of unspecified toe(s) with damage to nail, initial encounter: Secondary | ICD-10-CM | POA: Diagnosis not present

## 2020-06-10 DIAGNOSIS — R600 Localized edema: Secondary | ICD-10-CM | POA: Diagnosis not present

## 2020-06-11 DIAGNOSIS — L89893 Pressure ulcer of other site, stage 3: Secondary | ICD-10-CM | POA: Diagnosis not present

## 2020-06-12 DIAGNOSIS — L89893 Pressure ulcer of other site, stage 3: Secondary | ICD-10-CM | POA: Diagnosis not present

## 2020-06-12 DIAGNOSIS — S91302D Unspecified open wound, left foot, subsequent encounter: Secondary | ICD-10-CM | POA: Diagnosis not present

## 2020-06-17 DIAGNOSIS — L89893 Pressure ulcer of other site, stage 3: Secondary | ICD-10-CM | POA: Diagnosis not present

## 2020-06-17 DIAGNOSIS — I959 Hypotension, unspecified: Secondary | ICD-10-CM | POA: Diagnosis not present

## 2020-06-17 DIAGNOSIS — S91302D Unspecified open wound, left foot, subsequent encounter: Secondary | ICD-10-CM | POA: Diagnosis not present

## 2020-06-18 ENCOUNTER — Other Ambulatory Visit: Payer: Self-pay

## 2020-06-18 ENCOUNTER — Ambulatory Visit (HOSPITAL_COMMUNITY)
Admission: RE | Admit: 2020-06-18 | Discharge: 2020-06-18 | Disposition: A | Discharge status: Home / Self Care | Payer: BC Managed Care – PPO | Source: Ambulatory Visit | Attending: Adult Health Nurse Practitioner | Admitting: Adult Health Nurse Practitioner

## 2020-06-18 ENCOUNTER — Other Ambulatory Visit (HOSPITAL_COMMUNITY): Payer: Self-pay | Admitting: Adult Health Nurse Practitioner

## 2020-06-18 DIAGNOSIS — S91302D Unspecified open wound, left foot, subsequent encounter: Secondary | ICD-10-CM | POA: Diagnosis not present

## 2020-06-18 DIAGNOSIS — Y33XXXA Other specified events, undetermined intent, initial encounter: Secondary | ICD-10-CM | POA: Diagnosis not present

## 2020-06-18 DIAGNOSIS — Z89432 Acquired absence of left foot: Secondary | ICD-10-CM | POA: Diagnosis not present

## 2020-06-18 DIAGNOSIS — Z7984 Long term (current) use of oral hypoglycemic drugs: Secondary | ICD-10-CM | POA: Diagnosis not present

## 2020-06-18 DIAGNOSIS — G59 Mononeuropathy in diseases classified elsewhere: Secondary | ICD-10-CM | POA: Diagnosis not present

## 2020-06-18 DIAGNOSIS — M86472 Chronic osteomyelitis with draining sinus, left ankle and foot: Secondary | ICD-10-CM | POA: Diagnosis not present

## 2020-06-18 DIAGNOSIS — Z20822 Contact with and (suspected) exposure to covid-19: Secondary | ICD-10-CM | POA: Diagnosis not present

## 2020-06-18 DIAGNOSIS — N179 Acute kidney failure, unspecified: Secondary | ICD-10-CM | POA: Diagnosis not present

## 2020-06-18 DIAGNOSIS — D72829 Elevated white blood cell count, unspecified: Secondary | ICD-10-CM | POA: Diagnosis not present

## 2020-06-18 DIAGNOSIS — M86172 Other acute osteomyelitis, left ankle and foot: Secondary | ICD-10-CM | POA: Diagnosis not present

## 2020-06-18 DIAGNOSIS — R6520 Severe sepsis without septic shock: Secondary | ICD-10-CM | POA: Diagnosis not present

## 2020-06-18 DIAGNOSIS — E11621 Type 2 diabetes mellitus with foot ulcer: Secondary | ICD-10-CM | POA: Diagnosis not present

## 2020-06-18 DIAGNOSIS — L89893 Pressure ulcer of other site, stage 3: Secondary | ICD-10-CM | POA: Diagnosis not present

## 2020-06-18 DIAGNOSIS — M869 Osteomyelitis, unspecified: Secondary | ICD-10-CM | POA: Diagnosis not present

## 2020-06-18 DIAGNOSIS — S93125A Dislocation of metatarsophalangeal joint of left lesser toe(s), initial encounter: Secondary | ICD-10-CM | POA: Diagnosis not present

## 2020-06-18 DIAGNOSIS — E1169 Type 2 diabetes mellitus with other specified complication: Secondary | ICD-10-CM | POA: Diagnosis not present

## 2020-06-18 DIAGNOSIS — E1140 Type 2 diabetes mellitus with diabetic neuropathy, unspecified: Secondary | ICD-10-CM | POA: Diagnosis not present

## 2020-06-18 DIAGNOSIS — A419 Sepsis, unspecified organism: Secondary | ICD-10-CM | POA: Diagnosis not present

## 2020-06-18 DIAGNOSIS — Z79899 Other long term (current) drug therapy: Secondary | ICD-10-CM | POA: Diagnosis not present

## 2020-06-18 DIAGNOSIS — Z6837 Body mass index (BMI) 37.0-37.9, adult: Secondary | ICD-10-CM | POA: Diagnosis not present

## 2020-06-18 DIAGNOSIS — L97519 Non-pressure chronic ulcer of other part of right foot with unspecified severity: Secondary | ICD-10-CM | POA: Diagnosis not present

## 2020-06-18 DIAGNOSIS — F909 Attention-deficit hyperactivity disorder, unspecified type: Secondary | ICD-10-CM | POA: Diagnosis not present

## 2020-06-18 DIAGNOSIS — I959 Hypotension, unspecified: Secondary | ICD-10-CM | POA: Diagnosis not present

## 2020-06-18 DIAGNOSIS — I10 Essential (primary) hypertension: Secondary | ICD-10-CM | POA: Diagnosis not present

## 2020-06-18 DIAGNOSIS — F17210 Nicotine dependence, cigarettes, uncomplicated: Secondary | ICD-10-CM | POA: Diagnosis not present

## 2020-06-18 DIAGNOSIS — R05 Cough: Secondary | ICD-10-CM | POA: Diagnosis not present

## 2020-06-18 DIAGNOSIS — Z89429 Acquired absence of other toe(s), unspecified side: Secondary | ICD-10-CM | POA: Diagnosis not present

## 2020-06-18 DIAGNOSIS — E871 Hypo-osmolality and hyponatremia: Secondary | ICD-10-CM | POA: Diagnosis not present

## 2020-06-18 DIAGNOSIS — L97509 Non-pressure chronic ulcer of other part of unspecified foot with unspecified severity: Secondary | ICD-10-CM | POA: Diagnosis not present

## 2020-06-18 DIAGNOSIS — L97521 Non-pressure chronic ulcer of other part of left foot limited to breakdown of skin: Secondary | ICD-10-CM | POA: Diagnosis not present

## 2020-06-18 DIAGNOSIS — Z89412 Acquired absence of left great toe: Secondary | ICD-10-CM | POA: Diagnosis not present

## 2020-06-24 DIAGNOSIS — M86472 Chronic osteomyelitis with draining sinus, left ankle and foot: Secondary | ICD-10-CM | POA: Diagnosis not present

## 2020-06-24 DIAGNOSIS — E11621 Type 2 diabetes mellitus with foot ulcer: Secondary | ICD-10-CM | POA: Diagnosis not present

## 2020-06-24 DIAGNOSIS — L97509 Non-pressure chronic ulcer of other part of unspecified foot with unspecified severity: Secondary | ICD-10-CM | POA: Diagnosis not present

## 2020-06-24 DIAGNOSIS — L97529 Non-pressure chronic ulcer of other part of left foot with unspecified severity: Secondary | ICD-10-CM | POA: Diagnosis not present

## 2020-06-24 DIAGNOSIS — L97512 Non-pressure chronic ulcer of other part of right foot with fat layer exposed: Secondary | ICD-10-CM | POA: Diagnosis not present

## 2020-06-24 DIAGNOSIS — M86672 Other chronic osteomyelitis, left ankle and foot: Secondary | ICD-10-CM | POA: Diagnosis not present

## 2020-07-17 DIAGNOSIS — Z888 Allergy status to other drugs, medicaments and biological substances status: Secondary | ICD-10-CM | POA: Diagnosis not present

## 2020-07-17 DIAGNOSIS — F17210 Nicotine dependence, cigarettes, uncomplicated: Secondary | ICD-10-CM | POA: Diagnosis not present

## 2020-07-17 DIAGNOSIS — E669 Obesity, unspecified: Secondary | ICD-10-CM | POA: Diagnosis not present

## 2020-07-17 DIAGNOSIS — Z6837 Body mass index (BMI) 37.0-37.9, adult: Secondary | ICD-10-CM | POA: Diagnosis not present

## 2020-07-17 DIAGNOSIS — Z79899 Other long term (current) drug therapy: Secondary | ICD-10-CM | POA: Diagnosis not present

## 2020-07-17 DIAGNOSIS — D649 Anemia, unspecified: Secondary | ICD-10-CM | POA: Diagnosis not present

## 2020-07-17 DIAGNOSIS — E1142 Type 2 diabetes mellitus with diabetic polyneuropathy: Secondary | ICD-10-CM | POA: Diagnosis not present

## 2020-07-17 DIAGNOSIS — L97509 Non-pressure chronic ulcer of other part of unspecified foot with unspecified severity: Secondary | ICD-10-CM | POA: Diagnosis not present

## 2020-07-17 DIAGNOSIS — E1169 Type 2 diabetes mellitus with other specified complication: Secondary | ICD-10-CM | POA: Diagnosis not present

## 2020-07-17 DIAGNOSIS — L97529 Non-pressure chronic ulcer of other part of left foot with unspecified severity: Secondary | ICD-10-CM | POA: Diagnosis not present

## 2020-07-17 DIAGNOSIS — L97512 Non-pressure chronic ulcer of other part of right foot with fat layer exposed: Secondary | ICD-10-CM | POA: Diagnosis not present

## 2020-07-17 DIAGNOSIS — I10 Essential (primary) hypertension: Secondary | ICD-10-CM | POA: Diagnosis not present

## 2020-07-17 DIAGNOSIS — G8918 Other acute postprocedural pain: Secondary | ICD-10-CM | POA: Diagnosis not present

## 2020-07-17 DIAGNOSIS — G980 Neurogenic arthritis, not elsewhere classified: Secondary | ICD-10-CM | POA: Diagnosis not present

## 2020-07-17 DIAGNOSIS — L97521 Non-pressure chronic ulcer of other part of left foot limited to breakdown of skin: Secondary | ICD-10-CM | POA: Diagnosis not present

## 2020-07-17 DIAGNOSIS — E11621 Type 2 diabetes mellitus with foot ulcer: Secondary | ICD-10-CM | POA: Diagnosis not present

## 2020-07-17 DIAGNOSIS — M79672 Pain in left foot: Secondary | ICD-10-CM | POA: Diagnosis not present

## 2020-07-17 DIAGNOSIS — G4730 Sleep apnea, unspecified: Secondary | ICD-10-CM | POA: Diagnosis not present

## 2020-07-17 DIAGNOSIS — M86672 Other chronic osteomyelitis, left ankle and foot: Secondary | ICD-10-CM | POA: Diagnosis not present

## 2020-07-25 DIAGNOSIS — L97512 Non-pressure chronic ulcer of other part of right foot with fat layer exposed: Secondary | ICD-10-CM | POA: Diagnosis not present

## 2020-07-25 DIAGNOSIS — E11610 Type 2 diabetes mellitus with diabetic neuropathic arthropathy: Secondary | ICD-10-CM | POA: Diagnosis not present

## 2020-07-25 DIAGNOSIS — Z89432 Acquired absence of left foot: Secondary | ICD-10-CM | POA: Diagnosis not present

## 2020-08-07 DIAGNOSIS — Z89432 Acquired absence of left foot: Secondary | ICD-10-CM | POA: Diagnosis not present

## 2020-08-28 DIAGNOSIS — E11621 Type 2 diabetes mellitus with foot ulcer: Secondary | ICD-10-CM | POA: Diagnosis not present

## 2020-08-28 DIAGNOSIS — Z89432 Acquired absence of left foot: Secondary | ICD-10-CM | POA: Diagnosis not present

## 2020-08-28 DIAGNOSIS — L97509 Non-pressure chronic ulcer of other part of unspecified foot with unspecified severity: Secondary | ICD-10-CM | POA: Diagnosis not present

## 2020-09-03 DIAGNOSIS — R059 Cough, unspecified: Secondary | ICD-10-CM | POA: Diagnosis not present

## 2020-09-03 DIAGNOSIS — I10 Essential (primary) hypertension: Secondary | ICD-10-CM | POA: Diagnosis not present

## 2020-09-03 DIAGNOSIS — Z89429 Acquired absence of other toe(s), unspecified side: Secondary | ICD-10-CM | POA: Diagnosis not present

## 2020-09-03 DIAGNOSIS — E1169 Type 2 diabetes mellitus with other specified complication: Secondary | ICD-10-CM | POA: Diagnosis not present

## 2020-09-03 DIAGNOSIS — Z6837 Body mass index (BMI) 37.0-37.9, adult: Secondary | ICD-10-CM | POA: Diagnosis not present

## 2020-09-03 DIAGNOSIS — D72829 Elevated white blood cell count, unspecified: Secondary | ICD-10-CM | POA: Diagnosis not present

## 2020-09-10 DIAGNOSIS — Z0001 Encounter for general adult medical examination with abnormal findings: Secondary | ICD-10-CM | POA: Diagnosis not present

## 2020-09-10 DIAGNOSIS — Z23 Encounter for immunization: Secondary | ICD-10-CM | POA: Diagnosis not present

## 2020-09-19 DIAGNOSIS — L97512 Non-pressure chronic ulcer of other part of right foot with fat layer exposed: Secondary | ICD-10-CM | POA: Diagnosis not present

## 2020-09-19 DIAGNOSIS — Z4781 Encounter for orthopedic aftercare following surgical amputation: Secondary | ICD-10-CM | POA: Diagnosis not present

## 2020-09-19 DIAGNOSIS — Z89432 Acquired absence of left foot: Secondary | ICD-10-CM | POA: Diagnosis not present

## 2020-09-19 DIAGNOSIS — E11621 Type 2 diabetes mellitus with foot ulcer: Secondary | ICD-10-CM | POA: Diagnosis not present

## 2020-10-17 DIAGNOSIS — F17210 Nicotine dependence, cigarettes, uncomplicated: Secondary | ICD-10-CM | POA: Diagnosis not present

## 2020-10-17 DIAGNOSIS — E1140 Type 2 diabetes mellitus with diabetic neuropathy, unspecified: Secondary | ICD-10-CM | POA: Diagnosis not present

## 2020-10-17 DIAGNOSIS — E11621 Type 2 diabetes mellitus with foot ulcer: Secondary | ICD-10-CM | POA: Diagnosis not present

## 2020-10-17 DIAGNOSIS — L97512 Non-pressure chronic ulcer of other part of right foot with fat layer exposed: Secondary | ICD-10-CM | POA: Diagnosis not present

## 2020-10-21 DIAGNOSIS — F411 Generalized anxiety disorder: Secondary | ICD-10-CM | POA: Diagnosis not present

## 2020-10-21 DIAGNOSIS — R5383 Other fatigue: Secondary | ICD-10-CM | POA: Diagnosis not present

## 2020-10-21 DIAGNOSIS — F909 Attention-deficit hyperactivity disorder, unspecified type: Secondary | ICD-10-CM | POA: Diagnosis not present

## 2020-10-21 DIAGNOSIS — F331 Major depressive disorder, recurrent, moderate: Secondary | ICD-10-CM | POA: Diagnosis not present

## 2020-10-24 DIAGNOSIS — Z89432 Acquired absence of left foot: Secondary | ICD-10-CM | POA: Diagnosis not present

## 2020-10-24 DIAGNOSIS — E11621 Type 2 diabetes mellitus with foot ulcer: Secondary | ICD-10-CM | POA: Diagnosis not present

## 2020-11-08 DIAGNOSIS — E11610 Type 2 diabetes mellitus with diabetic neuropathic arthropathy: Secondary | ICD-10-CM | POA: Diagnosis not present

## 2020-11-08 DIAGNOSIS — L97912 Non-pressure chronic ulcer of unspecified part of right lower leg with fat layer exposed: Secondary | ICD-10-CM | POA: Diagnosis not present

## 2020-11-14 DIAGNOSIS — L97512 Non-pressure chronic ulcer of other part of right foot with fat layer exposed: Secondary | ICD-10-CM | POA: Diagnosis not present

## 2020-11-14 DIAGNOSIS — Z794 Long term (current) use of insulin: Secondary | ICD-10-CM | POA: Diagnosis not present

## 2020-11-14 DIAGNOSIS — L97509 Non-pressure chronic ulcer of other part of unspecified foot with unspecified severity: Secondary | ICD-10-CM | POA: Diagnosis not present

## 2020-11-14 DIAGNOSIS — E11621 Type 2 diabetes mellitus with foot ulcer: Secondary | ICD-10-CM | POA: Diagnosis not present

## 2020-11-14 DIAGNOSIS — E1142 Type 2 diabetes mellitus with diabetic polyneuropathy: Secondary | ICD-10-CM | POA: Diagnosis not present

## 2020-11-21 DIAGNOSIS — L97509 Non-pressure chronic ulcer of other part of unspecified foot with unspecified severity: Secondary | ICD-10-CM | POA: Diagnosis not present

## 2020-11-21 DIAGNOSIS — E1140 Type 2 diabetes mellitus with diabetic neuropathy, unspecified: Secondary | ICD-10-CM | POA: Diagnosis not present

## 2020-11-21 DIAGNOSIS — L97512 Non-pressure chronic ulcer of other part of right foot with fat layer exposed: Secondary | ICD-10-CM | POA: Diagnosis not present

## 2020-11-21 DIAGNOSIS — E11621 Type 2 diabetes mellitus with foot ulcer: Secondary | ICD-10-CM | POA: Diagnosis not present

## 2020-11-27 DIAGNOSIS — E1140 Type 2 diabetes mellitus with diabetic neuropathy, unspecified: Secondary | ICD-10-CM | POA: Diagnosis not present

## 2020-11-27 DIAGNOSIS — E11621 Type 2 diabetes mellitus with foot ulcer: Secondary | ICD-10-CM | POA: Diagnosis not present

## 2020-11-27 DIAGNOSIS — L97912 Non-pressure chronic ulcer of unspecified part of right lower leg with fat layer exposed: Secondary | ICD-10-CM | POA: Diagnosis not present

## 2020-11-27 DIAGNOSIS — L97512 Non-pressure chronic ulcer of other part of right foot with fat layer exposed: Secondary | ICD-10-CM | POA: Diagnosis not present

## 2020-12-03 DIAGNOSIS — R600 Localized edema: Secondary | ICD-10-CM | POA: Diagnosis not present

## 2020-12-03 DIAGNOSIS — F331 Major depressive disorder, recurrent, moderate: Secondary | ICD-10-CM | POA: Diagnosis not present

## 2020-12-03 DIAGNOSIS — F902 Attention-deficit hyperactivity disorder, combined type: Secondary | ICD-10-CM | POA: Diagnosis not present

## 2020-12-03 DIAGNOSIS — G546 Phantom limb syndrome with pain: Secondary | ICD-10-CM | POA: Diagnosis not present

## 2020-12-03 DIAGNOSIS — Z23 Encounter for immunization: Secondary | ICD-10-CM | POA: Diagnosis not present

## 2020-12-03 DIAGNOSIS — E1159 Type 2 diabetes mellitus with other circulatory complications: Secondary | ICD-10-CM | POA: Diagnosis not present

## 2020-12-04 IMAGING — DX DG FOOT COMPLETE 3+V*L*
3 series · 3 of 3 positions shown · non-contrast
Comparison: 11/11/2017

CLINICAL DATA: Open wound for months.

EXAM:
LEFT FOOT - COMPLETE 3+ VIEW

[foot ap]
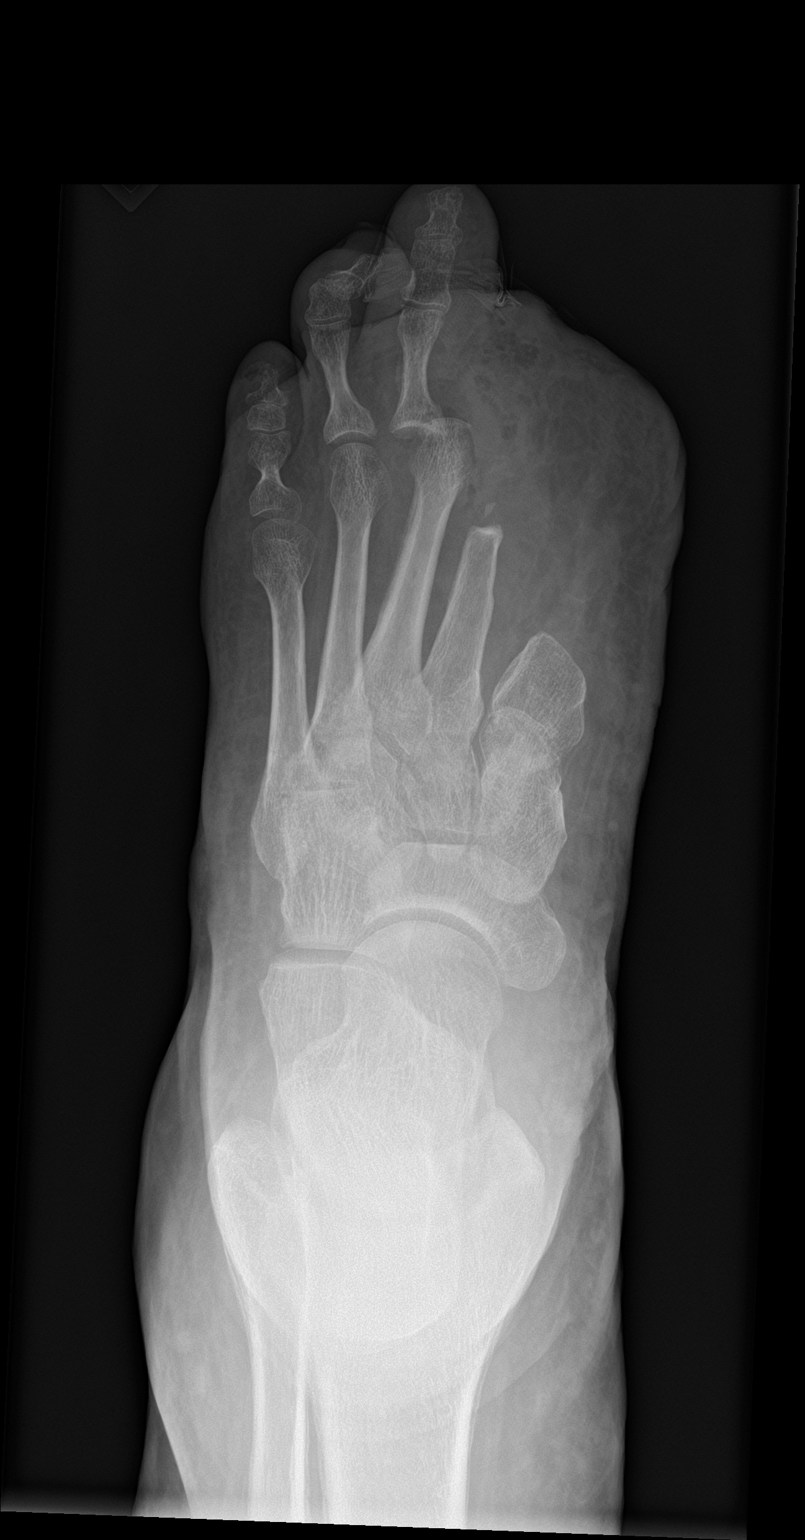

[foot obl]
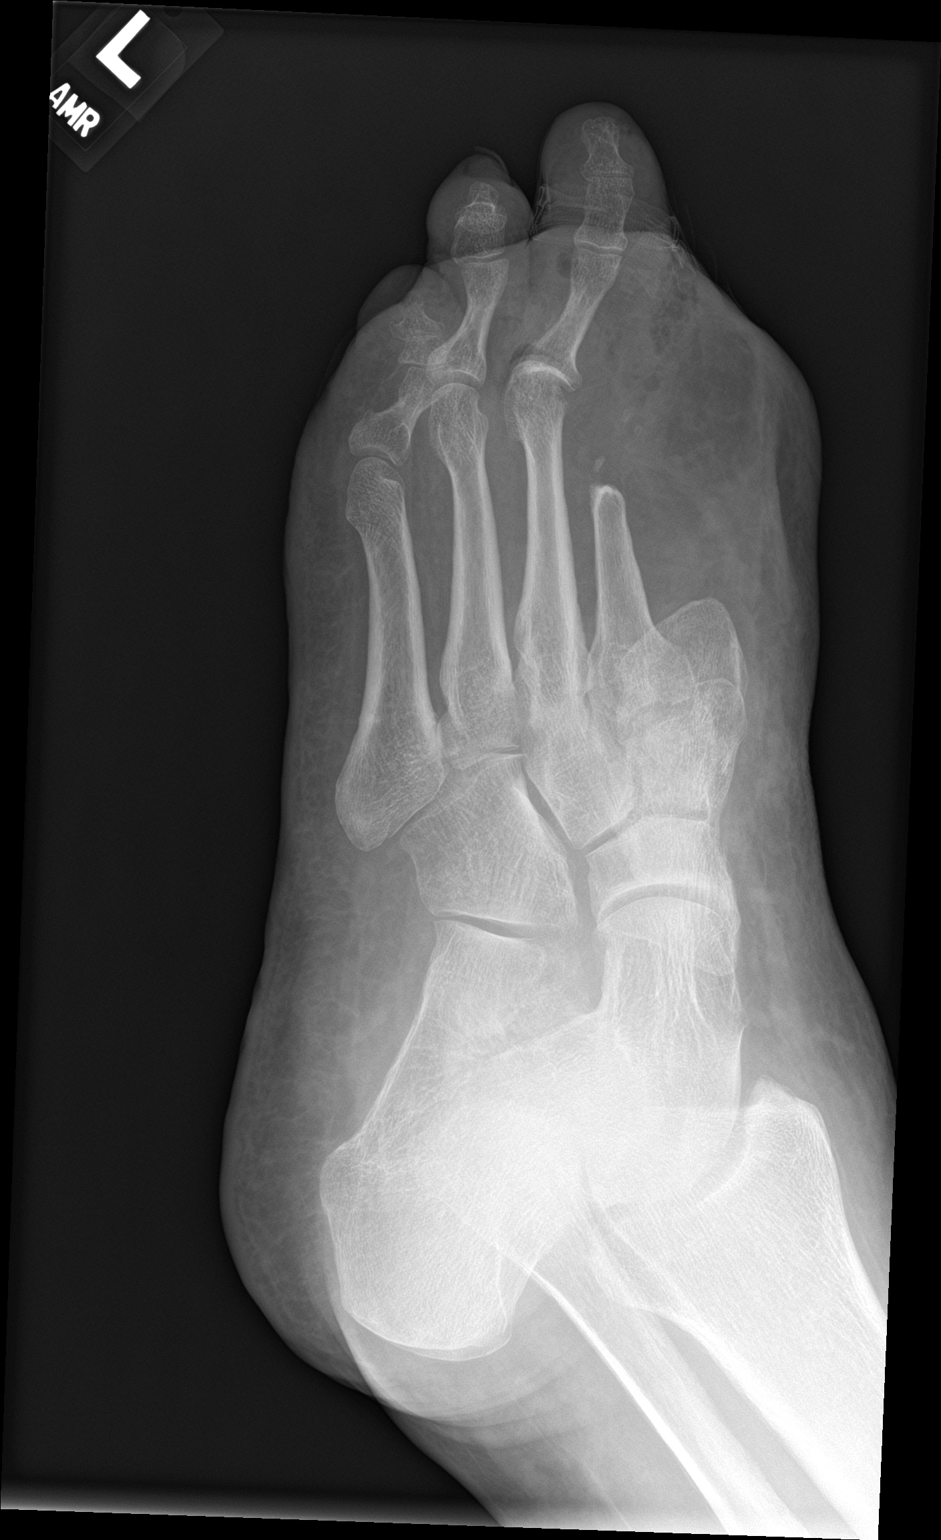

[foot lat]
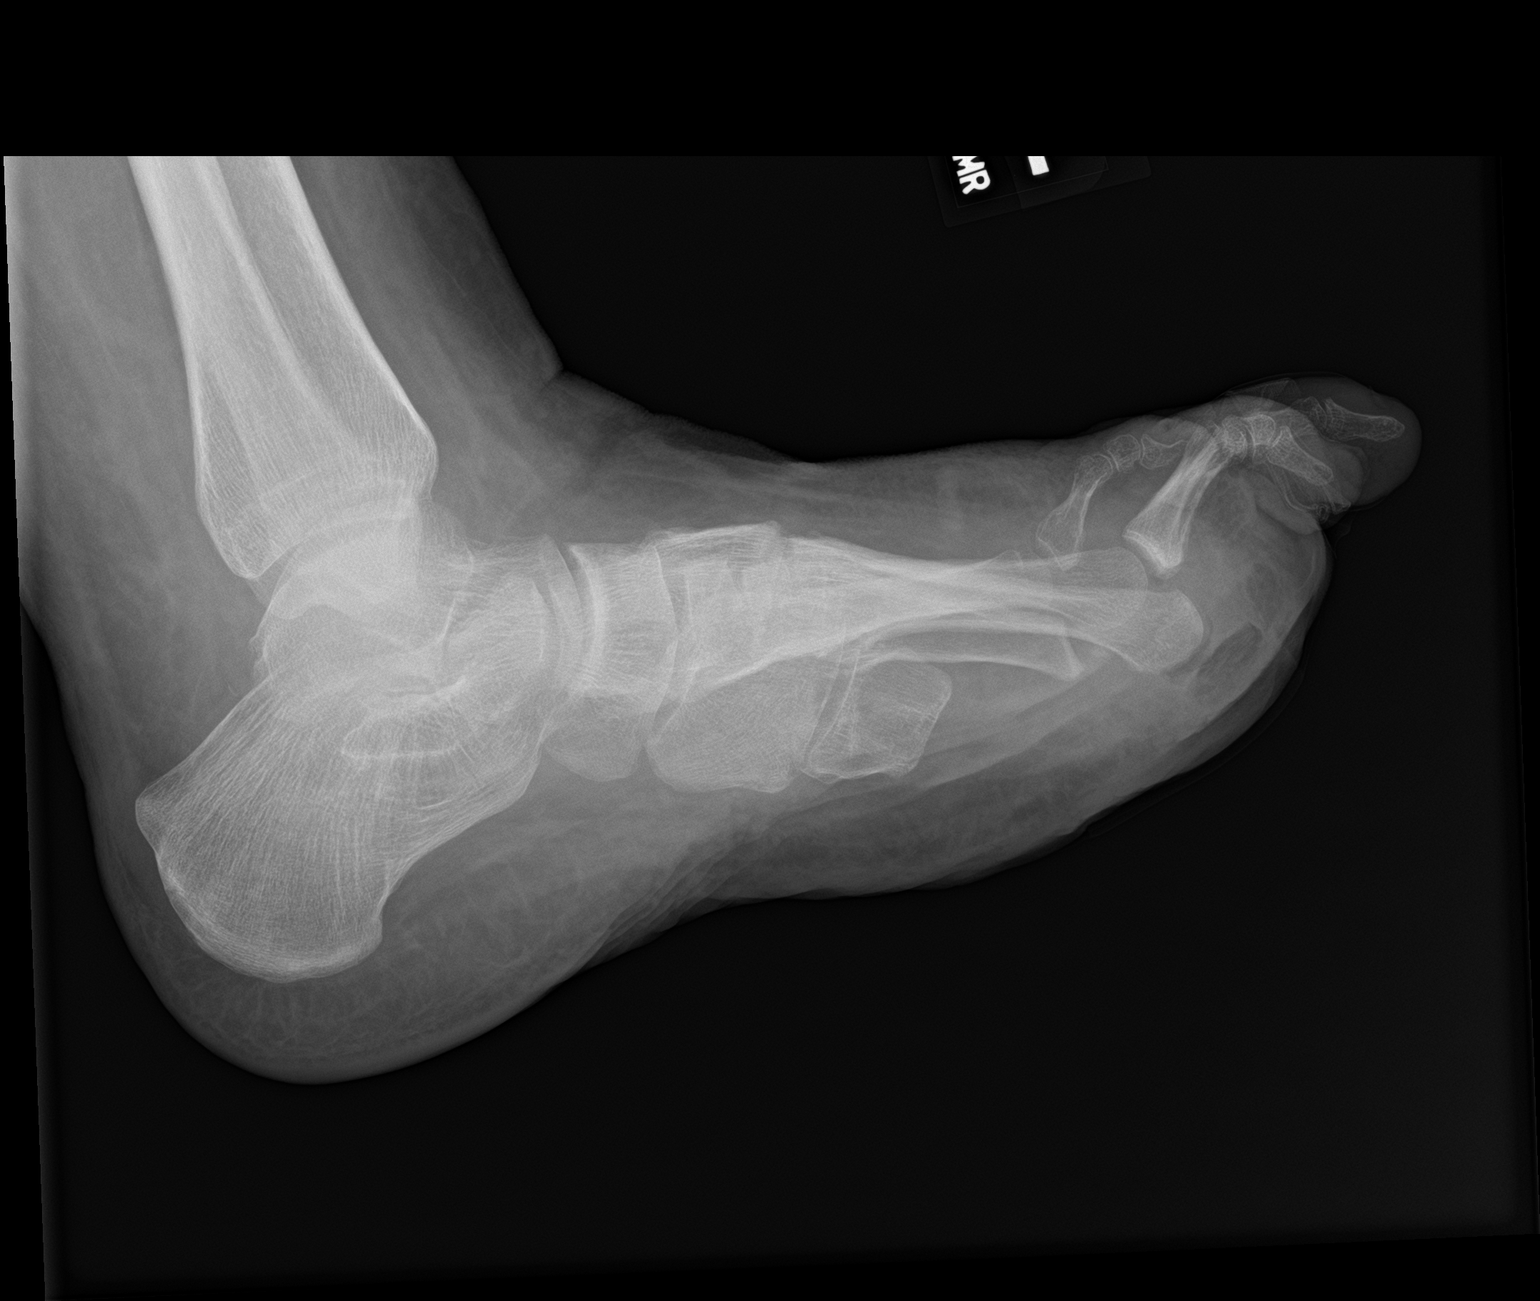

[3 of 3 positions shown; findings below may reference images not displayed]

FINDINGS: First and second ray amputation with healed appearance of the
osteotomies. There is dislocation of the third MTP joint with
cortical indistinctness at the third metatarsal head on the first
image and on the lateral view. There is a plantar ulcer at the level
of the third metatarsal head with soft tissue gas and swelling.
IMPRESSION: Plantar erosion at the third MCP head with soft tissue emphysema,
third MTP dislocation, and osteomyelitis findings at the metatarsal
head.

## 2021-01-23 ENCOUNTER — Telehealth: Payer: Self-pay | Admitting: Orthopedic Surgery

## 2021-01-23 NOTE — Telephone Encounter (Signed)
Patient called today stating due to changes in his insurance, he is having to change doctors.  He said that he has neuropathy of both feet with infection.  He said he has had 2-3 surgeries on his feet done at [facility].  He also states  that he is now under a different insurance which is [employer] and that he is having a hard time finding a doctor in the area.  He said his insurance did tell him that [facility] does accept the insurance.  He stated that he has seen Dr. Sharol Given previously and a surgery done by him.  I told him that Dr. Sharol Given does see patients from our office in [place].  He asked me for the phone number to that office to see if he could possibly schedule an appointment there.  He said he was very pleased that I took the time to talk to him and also told me that was very kind to him.  He will  call [facility].

## 2021-01-27 ENCOUNTER — Ambulatory Visit (INDEPENDENT_AMBULATORY_CARE_PROVIDER_SITE_OTHER): Payer: 59 | Admitting: Orthopedic Surgery

## 2021-01-27 ENCOUNTER — Ambulatory Visit (INDEPENDENT_AMBULATORY_CARE_PROVIDER_SITE_OTHER): Payer: 59

## 2021-01-27 ENCOUNTER — Encounter: Payer: Self-pay | Admitting: Orthopedic Surgery

## 2021-01-27 ENCOUNTER — Other Ambulatory Visit: Payer: Self-pay

## 2021-01-27 DIAGNOSIS — M79671 Pain in right foot: Secondary | ICD-10-CM

## 2021-01-27 DIAGNOSIS — L97514 Non-pressure chronic ulcer of other part of right foot with necrosis of bone: Secondary | ICD-10-CM | POA: Diagnosis not present

## 2021-01-27 DIAGNOSIS — M869 Osteomyelitis, unspecified: Secondary | ICD-10-CM

## 2021-01-27 NOTE — Progress Notes (Signed)
Office Visit Note   Patient: Johnny Martinez           Date of Birth: 02-26-1971           MRN: [id number] Visit Date: 01/27/2021              Requested by: Celene Squibb, MD [place],  [place] [address] PCP: Celene Squibb, MD  Chief Complaint  Patient presents with  . Right Foot - Open Wound      HPI: Patient is a 50 year old gentleman who presents complaining of a chronic ulcer beneath the fifth metatarsal head right foot.  He states this has been there for years.  Patient is status post a second ray amputation of the left foot by myself he subsequently followed up at [facility] underwent a great toe amputation and then a transmetatarsal amputation.  Assessment & Plan: Visit Diagnoses:  1. Pain in right foot   2. Ulcer of right foot, with necrosis of bone ([place])   3. Osteomyelitis of fifth toe of right foot ([place])     Plan: We will plan for right foot fifth ray amputation.  Risk and benefits were discussed the importance of nonweightbearing risk of the wound not healing.  Patient states he understands and wishes to proceed with surgery a week from Friday.  Follow-Up Instructions: Return in about 2 weeks (around 02/10/2021).   Ortho Exam  Patient is alert, oriented, no adenopathy, well-dressed, normal affect, normal respiratory effort. Examination patient has significant venous stasis swelling in both legs he has a stable transmetatarsal amputation on the left.  There is pitting edema both legs but no open ulcers.  His calfs measure 47 cm in circumference.  Semination the right foot I cannot palpate a pulse of the ankle the Doppler was used and he has a strong biphasic dorsalis pedis and posterior tibial pulse on the right.  He has a ulcer beneath the fifth metatarsal head that is 2 cm in diameter this is probed and bone is palpable deep within the wound the wound is 1 cm deep.  Radiographs show chronic osteomyelitis of the fifth metatarsal head.  Imaging: XR Foot  Complete Right  Result Date: 01/27/2021 Three-view radiographs of the right foot shows destruction of the fifth metatarsal head consistent with chronic osteomyelitis.  No images are attached to the encounter.  Labs: Lab Results  Component Value Date   HGBA1C 6.9 (H) 01/01/2019   HGBA1C 6.6 (H) 12/17/2017   ESRSEDRATE 9 04/14/2013   CRP 2.4 04/14/2013   REPTSTATUS 01/06/2019 FINAL 01/01/2019   CULT  01/01/2019    NO GROWTH 5 DAYS Performed at [facility], [address] [place], [place] [address]      Lab Results  Component Value Date   ALBUMIN 3.5 11/11/2017    Lab Results  Component Value Date   MG 1.6 (L) 11/12/2017   No results found for: VD25OH  No results found for: PREALBUMIN CBC EXTENDED Latest Ref Rng & Units 01/02/2019 01/01/2019 01/01/2019  WBC 4.0 - 10.5 K/uL 8.1 8.2 7.8  RBC 4.22 - 5.81 MIL/uL 4.05(L) 4.23 4.46  HGB 13.0 - 17.0 g/dL 12.9(L) 13.4 14.5  HCT 39.0 - 52.0 % 40.4 41.6 44.8  PLT 150 - 400 K/uL 231 234 250  NEUTROABS 1.7 - 7.7 K/uL - 5.6 6.1  LYMPHSABS 0.7 - 4.0 K/uL - 1.3 1.0     There is no height or weight on file to calculate BMI.  Orders:  Orders Placed This Encounter  Procedures  . XR Foot Complete Right   No orders of the defined types were placed in this encounter.    Procedures: No procedures performed  Clinical Data: No additional findings.  ROS:  All other systems negative, except as noted in the HPI. Review of Systems  Objective: Vital Signs: There were no vitals taken for this visit.  Specialty Comments:  No specialty comments available.  PMFS History: Patient Active Problem List   Diagnosis Date Noted  . Cellulitis of right lower extremity 01/01/2019  . Diabetic neurogenic arthropathy ([place]) 03/14/2018  . Ulcer of toe of left foot, limited to breakdown of skin ([place]) 03/14/2018  . ADD (attention deficit disorder) 11/13/2017  . Anxiety 11/13/2017  . HTN (hypertension) 11/13/2017  . Pre-diabetes 11/13/2017  .  Osteomyelitis of left foot ([place])   . Sepsis ([place]) 11/11/2017  . Severe sepsis ([place]) 11/11/2017  . Peripheral neuropathy 04/14/2013   Past Medical History:  Diagnosis Date  . ADD (attention deficit disorder)    adult  . ADD (attention deficit disorder)   . Anxiety and depression   . Arthritis    "probably a touch in my hands" (11/12/2017)  . GERD (gastroesophageal reflux disease)   . Hypertension   . Migraine    "maybe 2 in my lifetime" (11/12/2017)  . MRSA infection (methicillin-resistant Staphylococcus aureus)   . Neuropathy   . OSA on CPAP    uses cpap  . Pre-diabetes   . Restless legs     Family History  Problem Relation Age of Onset  . Cancer Mother   . Kidney failure Father   . Breast cancer Sister     Past Surgical History:  Procedure Laterality Date  . AMPUTATION Left 12/17/2017   Procedure: LEFT 2ND RAY AMPUTATION;  Surgeon: Newt Minion, MD;  Location: [place];  Service: Orthopedics;  Laterality: Left;  . NO PAST SURGERIES    . WISDOM TOOTH EXTRACTION     Social History   Occupational History    Comment: [employer]  Tobacco Use  . Smoking status: Current Every Day Smoker    Packs/day: 0.40    Years: 33.00    Pack years: 13.20    Types: Cigarettes  . Smokeless tobacco: Never Used  Vaping Use  . Vaping Use: Never used  Substance and Sexual Activity  . Alcohol use: Yes    Alcohol/week: 12.0 standard drinks    Types: 12 Cans of beer per week  . Drug use: No  . Sexual activity: Yes

## 2021-02-03 ENCOUNTER — Other Ambulatory Visit: Payer: Self-pay | Admitting: Physician Assistant

## 2021-02-05 ENCOUNTER — Other Ambulatory Visit (HOSPITAL_COMMUNITY)
Admission: RE | Admit: 2021-02-05 | Discharge: 2021-02-05 | Disposition: A | Discharge status: Home / Self Care | Payer: 59 | Source: Ambulatory Visit | Attending: Orthopedic Surgery | Admitting: Orthopedic Surgery

## 2021-02-05 DIAGNOSIS — Z20822 Contact with and (suspected) exposure to covid-19: Secondary | ICD-10-CM | POA: Insufficient documentation

## 2021-02-05 DIAGNOSIS — Z01812 Encounter for preprocedural laboratory examination: Secondary | ICD-10-CM | POA: Diagnosis present

## 2021-02-05 LAB — SARS CORONAVIRUS 2 (TAT 6-24 HRS): SARS Coronavirus 2: NEGATIVE

## 2021-02-06 ENCOUNTER — Other Ambulatory Visit: Payer: Self-pay

## 2021-02-06 ENCOUNTER — Telehealth: Payer: Self-pay | Admitting: Orthopedic Surgery

## 2021-02-06 ENCOUNTER — Encounter (HOSPITAL_COMMUNITY): Payer: Self-pay | Admitting: Orthopedic Surgery

## 2021-02-06 NOTE — Progress Notes (Signed)
Spoke with pt for pre-op call. Pt denies cardiac history. Pt is treated for HTN and type 2 Diabetes. Last A1C was 7.1 on 06/20/20. Pt is on Glipizide. Pt will not take that in the AM. Pt states he does not have a meter to check his blood sugar at home.   Pt states he has a painful rash under his left arm and a little rash under his right arm. He states it looks pus filled, but not draining at this time. He has called Dr. Jess Barters office and he states that they told him that it would not cancel his surgery. He also states that he does not have a fever. He does have hx of MRSA.   Covid test done 02/05/21 and it's negative. Pt states he's been in quarantine since the test was done and understands that he stays in quarantine until he comes to the hospital tomorrow.

## 2021-02-06 NOTE — Telephone Encounter (Signed)
Please advise 

## 2021-02-06 NOTE — Telephone Encounter (Signed)
Pt called to let you know he has a rash under his left arm. Wondering if it still fine that he has his surgery. He's using a topical ointment. CB [phone]

## 2021-02-07 ENCOUNTER — Encounter (HOSPITAL_COMMUNITY): Payer: Self-pay | Admitting: Orthopedic Surgery

## 2021-02-07 ENCOUNTER — Other Ambulatory Visit: Payer: Self-pay

## 2021-02-07 ENCOUNTER — Ambulatory Visit (HOSPITAL_COMMUNITY): Payer: 59 | Admitting: Certified Registered"

## 2021-02-07 ENCOUNTER — Ambulatory Visit (HOSPITAL_COMMUNITY)
Admission: RE | Admit: 2021-02-07 | Discharge: 2021-02-07 | Disposition: A | Discharge status: Home / Self Care | Payer: 59 | Attending: Orthopedic Surgery | Admitting: Orthopedic Surgery

## 2021-02-07 ENCOUNTER — Encounter (HOSPITAL_COMMUNITY)
Admission: RE | Disposition: A | Discharge status: Home / Self Care | Payer: Self-pay | Source: Home / Self Care | Attending: Orthopedic Surgery

## 2021-02-07 DIAGNOSIS — F17210 Nicotine dependence, cigarettes, uncomplicated: Secondary | ICD-10-CM | POA: Insufficient documentation

## 2021-02-07 DIAGNOSIS — Z7984 Long term (current) use of oral hypoglycemic drugs: Secondary | ICD-10-CM | POA: Diagnosis not present

## 2021-02-07 DIAGNOSIS — L97514 Non-pressure chronic ulcer of other part of right foot with necrosis of bone: Secondary | ICD-10-CM

## 2021-02-07 DIAGNOSIS — Z87892 Personal history of anaphylaxis: Secondary | ICD-10-CM | POA: Diagnosis not present

## 2021-02-07 DIAGNOSIS — M869 Osteomyelitis, unspecified: Secondary | ICD-10-CM | POA: Diagnosis not present

## 2021-02-07 DIAGNOSIS — E11621 Type 2 diabetes mellitus with foot ulcer: Secondary | ICD-10-CM | POA: Insufficient documentation

## 2021-02-07 DIAGNOSIS — E1169 Type 2 diabetes mellitus with other specified complication: Secondary | ICD-10-CM | POA: Diagnosis present

## 2021-02-07 DIAGNOSIS — Z91013 Allergy to seafood: Secondary | ICD-10-CM | POA: Diagnosis not present

## 2021-02-07 DIAGNOSIS — Z888 Allergy status to other drugs, medicaments and biological substances status: Secondary | ICD-10-CM | POA: Insufficient documentation

## 2021-02-07 HISTORY — PX: AMPUTATION: SHX166

## 2021-02-07 LAB — BASIC METABOLIC PANEL
Anion gap: 12 (ref 5–15)
BUN: 15 mg/dL (ref 6–20)
CO2: 25 mmol/L (ref 22–32)
Calcium: 9.3 mg/dL (ref 8.9–10.3)
Chloride: 93 mmol/L — ABNORMAL LOW (ref 98–111)
Creatinine, Ser: 0.83 mg/dL (ref 0.61–1.24)
GFR, Estimated: >60 mL/min (ref >60)
Glucose, Bld: 304 mg/dL — ABNORMAL HIGH (ref 70–99)
Potassium: 4.6 mmol/L (ref 3.5–5.1)
Sodium: 130 mmol/L — ABNORMAL LOW (ref 135–145)

## 2021-02-07 LAB — GLUCOSE, CAPILLARY
Glucose-Capillary: 351 mg/dL — ABNORMAL HIGH (ref 70–99)
Glucose-Capillary: 298 mg/dL — ABNORMAL HIGH (ref 70–99)
Glucose-Capillary: 296 mg/dL — ABNORMAL HIGH (ref 70–99)
Glucose-Capillary: 231 mg/dL — ABNORMAL HIGH (ref 70–99)

## 2021-02-07 SURGERY — AMPUTATION, FOOT, RAY
Anesthesia: General | Laterality: Right

## 2021-02-07 MED ORDER — PROMETHAZINE HCL 25 MG/ML IJ SOLN: 6.2500 mg | INTRAMUSCULAR | Status: DC | PRN

## 2021-02-07 MED ORDER — INSULIN ASPART 100 UNIT/ML ~~LOC~~ SOLN
SUBCUTANEOUS | Status: AC
  Administered 2021-02-07: 15 [IU] via SUBCUTANEOUS
  Filled 2021-02-07: qty 1

## 2021-02-07 MED ORDER — ACETAMINOPHEN 500 MG PO TABS
1000.0000 mg | ORAL_TABLET | Freq: Once | ORAL | Status: AC
Start: 2021-02-07 — End: 2021-02-07
  Administered 2021-02-07: 1000 mg via ORAL
  Filled 2021-02-07: qty 2

## 2021-02-07 MED ORDER — KETOROLAC TROMETHAMINE 30 MG/ML IJ SOLN
INTRAMUSCULAR | Status: AC
  Filled 2021-02-07: qty 1

## 2021-02-07 MED ORDER — FENTANYL CITRATE (PF) 250 MCG/5ML IJ SOLN
INTRAMUSCULAR | Status: AC
  Filled 2021-02-07: qty 5

## 2021-02-07 MED ORDER — PROPOFOL 10 MG/ML IV BOLUS
INTRAVENOUS | Status: DC | PRN
  Administered 2021-02-07: 50 mg via INTRAVENOUS
  Administered 2021-02-07: 200 mg via INTRAVENOUS

## 2021-02-07 MED ORDER — MEPERIDINE HCL 25 MG/ML IJ SOLN: 6.2500 mg | INTRAMUSCULAR | Status: DC | PRN

## 2021-02-07 MED ORDER — DEXTROSE 5 % IV SOLN
3.0000 g | INTRAVENOUS | Status: AC
  Administered 2021-02-07: 3 g via INTRAVENOUS
  Filled 2021-02-07: qty 3

## 2021-02-07 MED ORDER — PROPOFOL 10 MG/ML IV BOLUS
INTRAVENOUS | Status: AC
  Filled 2021-02-07: qty 20

## 2021-02-07 MED ORDER — 0.9 % SODIUM CHLORIDE (POUR BTL) OPTIME
TOPICAL | Status: DC | PRN
  Administered 2021-02-07: 1000 mL

## 2021-02-07 MED ORDER — ONDANSETRON HCL 4 MG/2ML IJ SOLN
INTRAMUSCULAR | Status: AC
  Filled 2021-02-07: qty 2

## 2021-02-07 MED ORDER — KETOROLAC TROMETHAMINE 30 MG/ML IJ SOLN
30.0000 mg | Freq: Once | INTRAMUSCULAR | Status: AC | PRN
  Administered 2021-02-07: 30 mg via INTRAVENOUS

## 2021-02-07 MED ORDER — PHENYLEPHRINE 40 MCG/ML (10ML) SYRINGE FOR IV PUSH (FOR BLOOD PRESSURE SUPPORT)
PREFILLED_SYRINGE | INTRAVENOUS | Status: DC | PRN
  Administered 2021-02-07: 120 ug via INTRAVENOUS
  Administered 2021-02-07: 80 ug via INTRAVENOUS
  Administered 2021-02-07: 120 ug via INTRAVENOUS
  Administered 2021-02-07: 80 ug via INTRAVENOUS

## 2021-02-07 MED ORDER — PROPOFOL 1000 MG/100ML IV EMUL
INTRAVENOUS | Status: AC
  Filled 2021-02-07: qty 100

## 2021-02-07 MED ORDER — FENTANYL CITRATE (PF) 100 MCG/2ML IJ SOLN
INTRAMUSCULAR | Status: DC | PRN
  Administered 2021-02-07: 50 ug via INTRAVENOUS

## 2021-02-07 MED ORDER — MIDAZOLAM HCL 5 MG/5ML IJ SOLN
INTRAMUSCULAR | Status: DC | PRN
  Administered 2021-02-07: 2 mg via INTRAVENOUS

## 2021-02-07 MED ORDER — OXYCODONE HCL 5 MG PO TABS: 5.0000 mg | ORAL_TABLET | Freq: Once | ORAL | Status: DC | PRN

## 2021-02-07 MED ORDER — OXYCODONE HCL 5 MG/5ML PO SOLN: 5.0000 mg | Freq: Once | ORAL | Status: DC | PRN

## 2021-02-07 MED ORDER — CEFAZOLIN SODIUM-DEXTROSE 2-4 GM/100ML-% IV SOLN
INTRAVENOUS | Status: AC
  Filled 2021-02-07: qty 100

## 2021-02-07 MED ORDER — ONDANSETRON HCL 4 MG/2ML IJ SOLN
INTRAMUSCULAR | Status: DC | PRN
  Administered 2021-02-07: 4 mg via INTRAVENOUS

## 2021-02-07 MED ORDER — LACTATED RINGERS IV SOLN: INTRAVENOUS | Status: DC | PRN

## 2021-02-07 MED ORDER — HYDROMORPHONE HCL 1 MG/ML IJ SOLN: 0.2500 mg | INTRAMUSCULAR | Status: DC | PRN

## 2021-02-07 MED ORDER — OXYCODONE-ACETAMINOPHEN 5-325 MG PO TABS: 1.0000 | ORAL_TABLET | ORAL | 0 refills | Status: AC | PRN

## 2021-02-07 MED ORDER — CHLORHEXIDINE GLUCONATE 0.12 % MT SOLN
OROMUCOSAL | Status: AC
  Administered 2021-02-07: 15 mL
  Filled 2021-02-07: qty 15

## 2021-02-07 MED ORDER — LIDOCAINE 2% (20 MG/ML) 5 ML SYRINGE
INTRAMUSCULAR | Status: DC | PRN
  Administered 2021-02-07: 60 mg via INTRAVENOUS

## 2021-02-07 MED ORDER — MIDAZOLAM HCL 2 MG/2ML IJ SOLN
INTRAMUSCULAR | Status: AC
  Filled 2021-02-07: qty 2

## 2021-02-07 MED ORDER — INSULIN ASPART 100 UNIT/ML ~~LOC~~ SOLN
15.0000 [IU] | Freq: Once | SUBCUTANEOUS | Status: AC
  Filled 2021-02-07: qty 0.15

## 2021-02-07 MED ORDER — INSULIN ASPART 100 UNIT/ML ~~LOC~~ SOLN: 15.0000 [IU] | Freq: Once | SUBCUTANEOUS | Status: AC

## 2021-02-07 SURGICAL SUPPLY — 32 items
BLADE SAW SGTL MED 73X18.5 STR (BLADE) IMPLANT
BLADE SURG 21 STRL SS (BLADE) ×2 IMPLANT
BNDG COHESIVE 4X5 TAN STRL (GAUZE/BANDAGES/DRESSINGS) ×2 IMPLANT
BNDG GAUZE ELAST 4 BULKY (GAUZE/BANDAGES/DRESSINGS) ×2 IMPLANT
COVER SURGICAL LIGHT HANDLE (MISCELLANEOUS) ×4 IMPLANT
COVER WAND RF STERILE (DRAPES) ×2 IMPLANT
DRAPE DERMATAC (DRAPES) ×4 IMPLANT
DRAPE U-SHAPE 47X51 STRL (DRAPES) ×4 IMPLANT
DRESSING PREVENA PLUS CUSTOM (GAUZE/BANDAGES/DRESSINGS) ×1 IMPLANT
DRSG ADAPTIC 3X8 NADH LF (GAUZE/BANDAGES/DRESSINGS) ×2 IMPLANT
DRSG PAD ABDOMINAL 8X10 ST (GAUZE/BANDAGES/DRESSINGS) ×4 IMPLANT
DRSG PREVENA PLUS CUSTOM (GAUZE/BANDAGES/DRESSINGS) ×2
DURAPREP 26ML APPLICATOR (WOUND CARE) ×2 IMPLANT
ELECT REM PT RETURN 9FT ADLT (ELECTROSURGICAL) ×2
ELECTRODE REM PT RTRN 9FT ADLT (ELECTROSURGICAL) ×1 IMPLANT
GAUZE SPONGE 4X4 12PLY STRL (GAUZE/BANDAGES/DRESSINGS) ×2 IMPLANT
GLOVE BIOGEL PI IND STRL 9 (GLOVE) ×1 IMPLANT
GLOVE BIOGEL PI INDICATOR 9 (GLOVE) ×1
GLOVE SURG ORTHO 9.0 STRL STRW (GLOVE) ×2 IMPLANT
GOWN STRL REUS W/ TWL XL LVL3 (GOWN DISPOSABLE) ×2 IMPLANT
GOWN STRL REUS W/TWL XL LVL3 (GOWN DISPOSABLE) ×2
KIT BASIN OR (CUSTOM PROCEDURE TRAY) ×2 IMPLANT
KIT PUMP PREVENA PLUS 14DAY (MISCELLANEOUS) ×2 IMPLANT
KIT TURNOVER KIT B (KITS) ×2 IMPLANT
NS IRRIG 1000ML POUR BTL (IV SOLUTION) ×2 IMPLANT
PACK ORTHO EXTREMITY (CUSTOM PROCEDURE TRAY) ×2 IMPLANT
PAD ARMBOARD 7.5X6 YLW CONV (MISCELLANEOUS) ×4 IMPLANT
STOCKINETTE IMPERVIOUS LG (DRAPES) IMPLANT
SUT ETHILON 2 0 PSLX (SUTURE) ×2 IMPLANT
TOWEL GREEN STERILE (TOWEL DISPOSABLE) ×2 IMPLANT
TUBE CONNECTING 12X1/4 (SUCTIONS) ×2 IMPLANT
YANKAUER SUCT BULB TIP NO VENT (SUCTIONS) ×2 IMPLANT

## 2021-02-07 NOTE — Interval H&P Note (Signed)
History and Physical Interval Note:  02/07/2021 11:24 AM  Johnny Martinez  has presented today for surgery, with the diagnosis of Osteomyelitis Right Little Toe.  The various methods of treatment have been discussed with the patient and family. After consideration of risks, benefits and other options for treatment, the patient has consented to  Procedure(s): RIGHT FOOT 5TH RAY AMPUTATION (Right) as a surgical intervention.  The patient's history has been reviewed, patient examined, no change in status, stable for surgery.  I have reviewed the patient's chart and labs.  Questions were answered to the patient's satisfaction.     Newt Minion

## 2021-02-07 NOTE — Progress Notes (Signed)
Pt has a post-op wedge foot at home and a walker/knee scooter to ambulate. Pt declined the use of crutches d/t discomfort. Verified with Ortho that it is an appropriate post-op shoe for a toe-amp surgery.

## 2021-02-07 NOTE — Anesthesia Procedure Notes (Signed)
Procedure Name: LMA Insertion Date/Time: 02/07/2021 9:33 AM Performed by: Gwyndolyn Saxon, CRNA Pre-anesthesia Checklist: Patient identified, Emergency Drugs available, Suction available and Patient being monitored Patient Re-evaluated:Patient Re-evaluated prior to induction Oxygen Delivery Method: Circle system utilized Preoxygenation: Pre-oxygenation with 100% oxygen Induction Type: IV induction Ventilation: Mask ventilation without difficulty LMA: LMA inserted LMA Size: 5.0 Number of attempts: 1 Placement Confirmation: positive ETCO2 and breath sounds checked- equal and bilateral Tube secured with: Tape Dental Injury: Teeth and Oropharynx as per pre-operative assessment

## 2021-02-07 NOTE — Op Note (Signed)
02/07/2021  10:11 AM  PATIENT:  Johnny Martinez    PRE-OPERATIVE DIAGNOSIS:  Osteomyelitis Right Little Toe  POST-OPERATIVE DIAGNOSIS:  Same  PROCEDURE:  RIGHT FOOT 5TH RAY AMPUTATION  SURGEON:  Newt Minion, MD  PHYSICIAN ASSISTANT:None ANESTHESIA:   General  PREOPERATIVE INDICATIONS:  Johnny Martinez is a  50 y.o. male with a diagnosis of Osteomyelitis Right Little Toe who failed conservative measures and elected for surgical management.    The risks benefits and alternatives were discussed with the patient preoperatively including but not limited to the risks of infection, bleeding, nerve injury, cardiopulmonary complications, the need for revision surgery, among others, and the patient was willing to proceed.  OPERATIVE IMPLANTS: 20 cm Prevena wound VAC  @ENCIMAGES @  OPERATIVE FINDINGS: No abscess at the tissue margins good petechial bleeding.  OPERATIVE PROCEDURE: Patient was brought the operating room and underwent general anesthetic.  After adequate levels anesthesia were obtained patient's right lower extremity was prepped using DuraPrep draped into a sterile field a timeout was called.  Elliptical incision was made around the fifth ray and the ulcer.  This left the wound that was 4 x 11 cm.  The fifth metatarsal was resected through the base.  Electrocautery was used hemostasis the wound was irrigated with normal saline.  The wound edges were healthy and viable.  Local tissue rearrangement was used to close the wound that was 4 x 11 cm.  2-0 nylon was used.  A Prevena wound VAC was applied this was outlined with derma tack this had a good suction fit patient was extubated taken the PACU in stable condition.   DISCHARGE PLANNING:  Antibiotic duration: Preoperative antibiotics  Weightbearing: Touchdown weightbearing on the right  Pain medication: Prescription for Percocet  Dressing care/ Wound VAC: Continue wound VAC for 1 week  Ambulatory devices: Crutches  Discharge  to: Home.  Follow-up: In the office 1 week post operative.

## 2021-02-07 NOTE — Anesthesia Postprocedure Evaluation (Signed)
Anesthesia Post Note  Patient: Johnny Martinez  Procedure(s) Performed: RIGHT FOOT 5TH RAY AMPUTATION (Right )     Patient location during evaluation: PACU Anesthesia Type: General Level of consciousness: awake and alert, oriented and patient cooperative Pain management: pain level controlled Vital Signs Assessment: post-procedure vital signs reviewed and stable Respiratory status: spontaneous breathing, nonlabored ventilation and respiratory function stable Cardiovascular status: blood pressure returned to baseline and stable Postop Assessment: no apparent nausea or vomiting Anesthetic complications: no   No complications documented.  Last Vitals:  Vitals:   02/07/21 1017 02/07/21 1032  BP: 128/89 131/87  Pulse: 70 70  Resp: 17 17  Temp:  36.6 C  SpO2: 92% 93%    Last Pain:  Vitals:   02/07/21 1032  TempSrc:   PainSc: 0-No pain                 Pervis Hocking

## 2021-02-07 NOTE — Interval H&P Note (Signed)
History and Physical Interval Note:  02/07/2021 6:40 AM  Johnny Martinez  has presented today for surgery, with the diagnosis of Osteomyelitis Right Little Toe.  The various methods of treatment have been discussed with the patient and family. After consideration of risks, benefits and other options for treatment, the patient has consented to  Procedure(s): RIGHT FOOT 5TH RAY AMPUTATION (Right) as a surgical intervention.  The patient's history has been reviewed, patient examined, no change in status, stable for surgery.  I have reviewed the patient's chart and labs.  Questions were answered to the patient's satisfaction.     Newt Minion

## 2021-02-07 NOTE — H&P (Signed)
Johnny Martinez is an 50 y.o. male.   Chief Complaint: Right Foot osteomyelitis HPI: Patient is a 50 year old gentleman who presents complaining of a chronic ulcer beneath the fifth metatarsal head right foot.  He states this has been there for years.  Patient is status post a second ray amputation of the left foot by myself he subsequently followed up at [facility] underwent a great toe amputation and then a transmetatarsal amputation.  Past Medical History:  Diagnosis Date  . ADD (attention deficit disorder)    adult  . ADD (attention deficit disorder)   . Anxiety and depression   . Arthritis    "probably a touch in my hands" (11/12/2017)  . Diabetes mellitus without complication ([place])   . GERD (gastroesophageal reflux disease)   . Hypertension   . Migraine    "maybe 2 in my lifetime" (11/12/2017)  . MRSA infection (methicillin-resistant Staphylococcus aureus)   . Neuropathy   . OSA on CPAP     not using at this time  . Restless legs     Past Surgical History:  Procedure Laterality Date  . AMPUTATION Left 12/17/2017   Procedure: LEFT 2ND RAY AMPUTATION;  Surgeon: Newt Minion, MD;  Location: [place];  Service: Orthopedics;  Laterality: Left;  . AMPUTATION Left    foot [facility] 2 at [facility]  . WISDOM TOOTH EXTRACTION      Family History  Problem Relation Age of Onset  . Cancer Mother   . Kidney failure Father   . Breast cancer Sister    Social History:  reports that he has been smoking cigarettes. He has a 13.20 pack-year smoking history. He has never used smokeless tobacco. He reports current alcohol use of about 6.0 standard drinks of alcohol per week. He reports that he does not use drugs.  Allergies:  Allergies  Allergen Reactions  . Iodine Anaphylaxis  . Shellfish Allergy Anaphylaxis    Medications Prior to Admission  Medication Sig Dispense Refill  . Desvenlafaxine Succinate ER 25 MG TB24 Take 25 mg by mouth daily.    Marland Kitchen glipiZIDE (GLUCOTROL XL) 5 MG 24 hr tablet Take 1 tablet  (5 mg total) by mouth daily. 30 tablet 0  . lisinopril-hydrochlorothiazide (PRINZIDE,ZESTORETIC) 20-12.5 MG per tablet Take 1 tablet by mouth daily.    . clindamycin (CLEOCIN) 300 MG capsule Take 1 capsule (300 mg total) by mouth 3 (three) times daily. (Patient not taking: No sig reported) 27 capsule 0  . HYDROcodone-acetaminophen (NORCO/VICODIN) 5-325 MG tablet Take 0.5 tablets by mouth daily as needed for moderate pain. (Patient not taking: Reported on 02/05/2021) 30 tablet 0    Results for orders placed or performed during the hospital encounter of 02/05/21 (from the past 48 hour(s))  SARS CORONAVIRUS 2 (TAT 6-24 HRS) Nasopharyngeal Nasopharyngeal Swab     Status: None   Collection Time: 02/05/21 11:13 AM   Specimen: Nasopharyngeal Swab  Result Value Ref Range   SARS Coronavirus 2 NEGATIVE NEGATIVE    Comment: (NOTE) SARS-CoV-2 target nucleic acids are NOT DETECTED.  The SARS-CoV-2 RNA is generally detectable in upper and lower respiratory specimens during the acute phase of infection. Negative results do not preclude SARS-CoV-2 infection, do not rule out co-infections with other pathogens, and should not be used as the sole basis for treatment or other patient management decisions. Negative results must be combined with clinical observations, patient history, and epidemiological information. The expected result is Negative.  Fact Sheet for Patients: SugarRoll.be  Fact Sheet for  Healthcare Providers: https://www.woods-mathews.com/  This test is not yet approved or cleared by the [place] and  has been authorized for detection and/or diagnosis of SARS-CoV-2 by FDA under an Emergency Use Authorization (EUA). This EUA will remain  in effect (meaning this test can be used) for the duration of the COVID-19 declaration under Se ction 564(b)(1) of the Act, 21 U.S.C. section 360bbb-3(b)(1), unless the authorization is terminated or revoked  sooner.  Performed at [facility], [place] [address], [place], [place] [address]    No results found.  Review of Systems  All other systems reviewed and are negative.   There were no vitals taken for this visit. Physical Exam  Patient is alert, oriented, no adenopathy, well-dressed, normal affect, normal respiratory effort. Examination patient has significant venous stasis swelling in both legs he has a stable transmetatarsal amputation on the left.  There is pitting edema both legs but no open ulcers.  His calfs measure 47 cm in circumference.  Semination the right foot I cannot palpate a pulse of the ankle the Doppler was used and he has a strong biphasic dorsalis pedis and posterior tibial pulse on the right.  He has a ulcer beneath the fifth metatarsal head that is 2 cm in diameter this is probed and bone is palpable deep within the wound the wound is 1 cm deep.  Radiographs show chronic osteomyelitis of the fifth metatarsal head.Lungs clear heart RRR Assessment/Plan 1. Pain in right foot   2. Ulcer of right foot, with necrosis of bone ([place])   3. Osteomyelitis of fifth toe of right foot ([place])     Plan: We will plan for right foot fifth ray amputation.  Risk and benefits were discussed the importance of nonweightbearing risk of the wound not healing.  Patient states he understands and wishes to proceed with surgery a week from Friday.   Bevely Palmer Yitzhak Awan, PA 02/07/2021, 6:38 AM

## 2021-02-07 NOTE — Transfer of Care (Signed)
Immediate Anesthesia Transfer of Care Note  Patient: Johnny Martinez  Procedure(s) Performed: RIGHT FOOT 5TH RAY AMPUTATION (Right )  Patient Location: PACU  Anesthesia Type:General  Level of Consciousness: awake, alert  and oriented  Airway & Oxygen Therapy: Patient Spontanous Breathing  Post-op Assessment: Report given to RN and Post -op Vital signs reviewed and stable  Post vital signs: Reviewed and stable  Last Vitals:  Vitals Value Taken Time  BP 138/91 02/07/21 1002  Temp 36.6 C 02/07/21 1002  Pulse 80 02/07/21 1004  Resp 20 02/07/21 1004  SpO2 92 % 02/07/21 1004  Vitals shown include unvalidated device data.  Last Pain:  Vitals:   02/07/21 0713  TempSrc:   PainSc: 0-No pain         Complications: No complications documented.

## 2021-02-07 NOTE — Anesthesia Preprocedure Evaluation (Addendum)
Anesthesia Evaluation  Patient identified by MRN, date of birth, ID band Patient awake    Reviewed: Allergy & Precautions, NPO status , Patient's Chart, lab work & pertinent test results  Airway Mallampati: III  TM Distance: >3 FB Neck ROM: Full    Dental no notable dental hx. (+) Teeth Intact, Dental Advisory Given   Pulmonary sleep apnea (not using CPAP) , Current Smoker,  Still smoking, 15 pack year history 1/2 ppd x many years  No inhalers   Pulmonary exam normal breath sounds clear to auscultation       Cardiovascular hypertension, Pt. on medications Normal cardiovascular exam Rhythm:Regular Rate:Normal     Neuro/Psych  Headaches, PSYCHIATRIC DISORDERS Anxiety Depression  Neuromuscular disease (RLS, peripheral neuropathy)    GI/Hepatic GERD  Controlled,(+)     substance abuse  alcohol use,   Endo/Other  diabetes, Poorly Controlled, Type 2, Oral Hypoglycemic AgentsObesity BMI 37 FS 351 in preop  Renal/GU negative Renal ROS  negative genitourinary   Musculoskeletal  (+) Arthritis , Osteoarthritis,  Osteo R little toe   Abdominal (+) + obese,   Peds  Hematology negative hematology ROS (+)   Anesthesia Other Findings   Reproductive/Obstetrics negative OB ROS                            Anesthesia Physical Anesthesia Plan  ASA: III  Anesthesia Plan: General   Post-op Pain Management:    Induction:   PONV Risk Score and Plan: 2 and Ondansetron, Midazolam and Treatment may vary due to age or medical condition  Airway Management Planned: LMA  Additional Equipment: None  Intra-op Plan:   Post-operative Plan: Extubation in OR  Informed Consent: I have reviewed the patients History and Physical, chart, labs and discussed the procedure including the risks, benefits and alternatives for the proposed anesthesia with the patient or authorized representative who has indicated his/her  understanding and acceptance.     Dental advisory given  Plan Discussed with: CRNA  Anesthesia Plan Comments: (FS very high in preop- will treat with insulin, d/w patient that he likely needs long-term insulin Has peripheral neuropathy, very little feeling in feet- unlikely to need regional anesthesia for postoperative pain )       Anesthesia Quick Evaluation

## 2021-02-08 ENCOUNTER — Encounter (HOSPITAL_COMMUNITY): Payer: Self-pay | Admitting: Orthopedic Surgery

## 2021-02-13 ENCOUNTER — Ambulatory Visit (INDEPENDENT_AMBULATORY_CARE_PROVIDER_SITE_OTHER): Payer: 59 | Admitting: Physician Assistant

## 2021-02-13 ENCOUNTER — Encounter: Payer: Self-pay | Admitting: Orthopedic Surgery

## 2021-02-13 DIAGNOSIS — M79671 Pain in right foot: Secondary | ICD-10-CM

## 2021-02-13 NOTE — Progress Notes (Signed)
Office Visit Note   Patient: Johnny Martinez           Date of Birth: 1971-09-17           MRN: [id number] Visit Date: 02/13/2021              Requested by: Celene Squibb, MD [place],  [place] [id number] PCP: Curly Rim, MD  Chief Complaint  Patient presents with  . Right Foot - Routine Post Op    02/07/21 right foot 5th ray amputation       HPI: Patient is 6 days status post right foot fifth ray amputation.  He said the [facility] started alarming last night.  And it turned off.  Assessment & Plan: Visit Diagnoses: No diagnosis found.  Plan: Emphasized the importance of elevating it and protein supplementation.  Also vitamin C D3 and zinc if he is able.  May do daily dressing changes and cleanse with antibacterial soap and water follow-up in 1 week  Follow-Up Instructions: No follow-ups on file.   Ortho Exam  Patient is alert, oriented, no adenopathy, well-dressed, normal affect, normal respiratory effort. Examination of the foot he has moderate soft tissue swelling no ascending cellulitis he does have maceration after the wound VAC was removed sutures are in place no wound dehiscence.  No ascending cellulitis  Imaging: No results found. No images are attached to the encounter.  Labs: Lab Results  Component Value Date   HGBA1C 6.9 (H) 01/01/2019   HGBA1C 6.6 (H) 12/17/2017   ESRSEDRATE 9 04/14/2013   CRP 2.4 04/14/2013   REPTSTATUS 01/06/2019 FINAL 01/01/2019   CULT  01/01/2019    NO GROWTH 5 DAYS Performed at [facility], [address] [place],  [address]      Lab Results  Component Value Date   ALBUMIN 3.5 11/11/2017    Lab Results  Component Value Date   MG 1.6 (L) 11/12/2017   No results found for: VD25OH  No results found for: PREALBUMIN CBC EXTENDED Latest Ref Rng & Units 01/02/2019 01/01/2019 01/01/2019  WBC 4.0 - 10.5 K/uL 8.1 8.2 7.8  RBC 4.22 - 5.81 MIL/uL 4.05(L) 4.23 4.46  HGB 13.0 - 17.0 g/dL 12.9(L) 13.4 14.5  HCT  39.0 - 52.0 % 40.4 41.6 44.8  PLT 150 - 400 K/uL 231 234 250  NEUTROABS 1.7 - 7.7 K/uL - 5.6 6.1  LYMPHSABS 0.7 - 4.0 K/uL - 1.3 1.0     There is no height or weight on file to calculate BMI.  Orders:  No orders of the defined types were placed in this encounter.  No orders of the defined types were placed in this encounter.    Procedures: No procedures performed  Clinical Data: No additional findings.  ROS:  All other systems negative, except as noted in the HPI. Review of Systems  Objective: Vital Signs: There were no vitals taken for this visit.  Specialty Comments:  No specialty comments available.  PMFS History: Patient Active Problem List   Diagnosis Date Noted  . Ulcer of right foot, with necrosis of bone ([place])   . Cellulitis of right lower extremity 01/01/2019  . Diabetic neurogenic arthropathy ([place]) 03/14/2018  . Ulcer of toe of left foot, limited to breakdown of skin ([place]) 03/14/2018  . ADD (attention deficit disorder) 11/13/2017  . Anxiety 11/13/2017  . HTN (hypertension) 11/13/2017  . Pre-diabetes 11/13/2017  . Osteomyelitis of fifth toe of right foot ([place])   .  Sepsis ([place]) 11/11/2017  . Severe sepsis ([place]) 11/11/2017  . Peripheral neuropathy 04/14/2013   Past Medical History:  Diagnosis Date  . ADD (attention deficit disorder)    adult  . ADD (attention deficit disorder)   . Anxiety and depression   . Arthritis    "probably a touch in my hands" (11/12/2017)  . Diabetes mellitus without complication ([place])   . GERD (gastroesophageal reflux disease)   . Hypertension   . Migraine    "maybe 2 in my lifetime" (11/12/2017)  . MRSA infection (methicillin-resistant Staphylococcus aureus)   . Neuropathy   . OSA on CPAP     not using at this time  . Restless legs     Family History  Problem Relation Age of Onset  . Cancer Mother   . Kidney failure Father   . Breast cancer Sister     Past Surgical History:  Procedure Laterality Date  .  AMPUTATION Left 12/17/2017   Procedure: LEFT 2ND RAY AMPUTATION;  Surgeon: Newt Minion, MD;  Location: [place];  Service: Orthopedics;  Laterality: Left;  . AMPUTATION Left    foot x 2 at [facility]  . AMPUTATION Right 02/07/2021   Procedure: RIGHT FOOT 5TH RAY AMPUTATION;  Surgeon: Newt Minion, MD;  Location: [place];  Service: Orthopedics;  Laterality: Right;  . WISDOM TOOTH EXTRACTION     Social History   Occupational History    Comment: [employer]  Tobacco Use  . Smoking status: Current Every Day Smoker    Packs/day: 0.40    Years: 33.00    Pack years: 13.20    Types: Cigarettes  . Smokeless tobacco: Never Used  Vaping Use  . Vaping Use: Never used  Substance and Sexual Activity  . Alcohol use: Yes    Alcohol/week: 6.0 standard drinks    Types: 6 Standard drinks or equivalent per week  . Drug use: No  . Sexual activity: Yes

## 2021-02-20 ENCOUNTER — Ambulatory Visit (INDEPENDENT_AMBULATORY_CARE_PROVIDER_SITE_OTHER): Payer: 59 | Admitting: Orthopedic Surgery

## 2021-02-20 DIAGNOSIS — Z89421 Acquired absence of other right toe(s): Secondary | ICD-10-CM

## 2021-02-21 ENCOUNTER — Encounter: Payer: Self-pay | Admitting: Orthopedic Surgery

## 2021-02-21 NOTE — Progress Notes (Signed)
Office Visit Note   Patient: Johnny Martinez           Date of Birth: 02/11/1971           MRN: [id number] Visit Date: 02/20/2021              Requested by: Johnny Rim, MD Johnny [facility],  [place] [id number] PCP: Johnny Rim, MD  Chief Complaint  Patient presents with  . Right Foot - Follow-up      HPI: Patient is a 50 year old gentleman who presents approximately 2 weeks status post right foot fifth ray amputation.  Assessment & Plan: Visit Diagnoses:  1. History of partial ray amputation of fifth toe of right foot ([place])     Plan: Recommend patient wear compression around-the-clock change to wash the foot with soap and water.  A sample of the double extra-large sock was applied.  Follow-Up Instructions: Return in about 1 week (around 02/27/2021).   Ortho Exam  Patient is alert, oriented, no adenopathy, well-dressed, normal affect, normal respiratory effort. Examination patient has increased swelling in the right lower extremity there is brawny skin color changes with chronic venous insufficiency the wound is gaping open secondary to the swelling.  His calf measures 46 cm in circumference.  Imaging: No results found. No images are attached to the encounter.  Labs: Lab Results  Component Value Date   HGBA1C 6.9 (H) 01/01/2019   HGBA1C 6.6 (H) 12/17/2017   ESRSEDRATE 9 04/14/2013   CRP 2.4 04/14/2013   REPTSTATUS 01/06/2019 FINAL 01/01/2019   CULT  01/01/2019    NO GROWTH 5 DAYS Performed at [facility], [address], [place], [place] [address]      Lab Results  Component Value Date   ALBUMIN 3.5 11/11/2017    Lab Results  Component Value Date   MG 1.6 (L) 11/12/2017   No results found for: VD25OH  No results found for: PREALBUMIN CBC EXTENDED Latest Ref Rng & Units 01/02/2019 01/01/2019 01/01/2019  WBC 4.0 - 10.5 K/uL 8.1 8.2 7.8  RBC 4.22 - 5.81 MIL/uL 4.05(L) 4.23 4.46  HGB 13.0 - 17.0 g/dL 12.9(L) 13.4 14.5  HCT 39.0 - 52.0 %  40.4 41.6 44.8  PLT 150 - 400 K/uL 231 234 250  NEUTROABS 1.7 - 7.7 K/uL - 5.6 6.1  LYMPHSABS 0.7 - 4.0 K/uL - 1.3 1.0     There is no height or weight on file to calculate BMI.  Orders:  No orders of the defined types were placed in this encounter.  No orders of the defined types were placed in this encounter.    Procedures: No procedures performed  Clinical Data: No additional findings.  ROS:  All other systems negative, except as noted in the HPI. Review of Systems  Objective: Vital Signs: There were no vitals taken for this visit.  Specialty Comments:  No specialty comments available.  PMFS History: Patient Active Problem List   Diagnosis Date Noted  . Ulcer of right foot, with necrosis of bone ([place])   . Cellulitis of right lower extremity 01/01/2019  . Diabetic neurogenic arthropathy ([facility]) 03/14/2018  . Ulcer of toe of left foot, limited to breakdown of skin ([facility] Martinez) 03/14/2018  . ADD (attention deficit disorder) 11/13/2017  . Anxiety 11/13/2017  . HTN (hypertension) 11/13/2017  . Pre-diabetes 11/13/2017  . Osteomyelitis of fifth toe of right foot ([place])   . Sepsis (Johnny Martinez) 11/11/2017  . Severe sepsis (Johnny Martinez) 11/11/2017  . Peripheral  neuropathy 04/14/2013   Past Medical History:  Diagnosis Date  . ADD (attention deficit disorder)    adult  . ADD (attention deficit disorder)   . Anxiety and depression   . Arthritis    "probably a touch in my hands" (11/12/2017)  . Diabetes mellitus without complication ([facility])   . GERD (gastroesophageal reflux disease)   . Hypertension   . Migraine    "maybe 2 in my lifetime" (11/12/2017)  . MRSA infection (methicillin-resistant Staphylococcus aureus)   . Neuropathy   . OSA on CPAP     not using at this time  . Restless legs     Family History  Problem Relation Age of Onset  . Cancer Mother   . Kidney failure Father   . Breast cancer Sister     Past Surgical History:  Procedure Laterality Date  . AMPUTATION Left  12/17/2017   Procedure: LEFT 2ND RAY AMPUTATION;  Surgeon: Johnny Minion, MD;  Location: [place];  Service: Orthopedics;  Laterality: Left;  . AMPUTATION Left    foot x 2 at [facility]  . AMPUTATION Right 02/07/2021   Procedure: RIGHT FOOT 5TH RAY AMPUTATION;  Surgeon: Johnny Minion, MD;  Location: [place];  Service: Orthopedics;  Laterality: Right;  . WISDOM TOOTH EXTRACTION     Social History   Occupational History    Comment: [employer]  Tobacco Use  . Smoking status: Current Every Day Smoker    Packs/day: 0.40    Years: 33.00    Pack years: 13.20    Types: Cigarettes  . Smokeless tobacco: Never Used  Vaping Use  . Vaping Use: Never used  Substance and Sexual Activity  . Alcohol use: Yes    Alcohol/week: 6.0 standard drinks    Types: 6 Standard drinks or equivalent per week  . Drug use: No  . Sexual activity: Yes

## 2021-02-27 ENCOUNTER — Ambulatory Visit (INDEPENDENT_AMBULATORY_CARE_PROVIDER_SITE_OTHER): Payer: 59 | Admitting: Physician Assistant

## 2021-02-27 ENCOUNTER — Encounter: Payer: Self-pay | Admitting: Orthopedic Surgery

## 2021-02-27 DIAGNOSIS — M869 Osteomyelitis, unspecified: Secondary | ICD-10-CM

## 2021-02-27 NOTE — Progress Notes (Signed)
Office Visit Note   Patient: Johnny Martinez           Date of Birth: 08/21/1971           MRN: [id number] Visit Date: 02/27/2021              Requested by: Curly Rim, MD [place],  [place] [id number] PCP: Curly Rim, MD  Chief Complaint  Patient presents with  . Right Foot - Routine Post Op    02/07/21 RIGHT FOOT 5TH RAY AMPUTATION       HPI: Patient presents today 3 weeks status post right foot fifth ray amputation.  He has been wearing his medical compression sock and a postop shoe for the most part he is using a knee scooter  Assessment & Plan: Visit Diagnoses: No diagnosis found.  Plan: Patient will continue cleansing we discussed protein intake and vitamin supplementation continue to limit weightbearing.  Follow-up in 2 weeks  Follow-Up Instructions: No follow-ups on file.   Ortho Exam  Patient is alert, oriented, no adenopathy, well-dressed, normal affect, normal respiratory effort. Surgical sutures were removed today he does have some skin maceration and some serous drainage.  He has some superficial wound dehiscence but nothing probes deeply or tunnels.  Mild to moderate soft tissue swelling but no ascending cellulitis  Imaging: No results found. No images are attached to the encounter.  Labs: Lab Results  Component Value Date   HGBA1C 6.9 (H) 01/01/2019   HGBA1C 6.6 (H) 12/17/2017   ESRSEDRATE 9 04/14/2013   CRP 2.4 04/14/2013   REPTSTATUS 01/06/2019 FINAL 01/01/2019   CULT  01/01/2019    NO GROWTH 5 DAYS Performed at [facility], [address], [place], [place] [address]      Lab Results  Component Value Date   ALBUMIN 3.5 11/11/2017    Lab Results  Component Value Date   MG 1.6 (L) 11/12/2017   No results found for: VD25OH  No results found for: PREALBUMIN CBC EXTENDED Latest Ref Rng & Units 01/02/2019 01/01/2019 01/01/2019  WBC 4.0 - 10.5 K/uL 8.1 8.2 7.8  RBC 4.22 - 5.81 MIL/uL 4.05(L) 4.23 4.46  HGB 13.0 -  17.0 g/dL 12.9(L) 13.4 14.5  HCT 39.0 - 52.0 % 40.4 41.6 44.8  PLT 150 - 400 K/uL 231 234 250  NEUTROABS 1.7 - 7.7 K/uL - 5.6 6.1  LYMPHSABS 0.7 - 4.0 K/uL - 1.3 1.0     There is no height or weight on file to calculate BMI.  Orders:  No orders of the defined types were placed in this encounter.  No orders of the defined types were placed in this encounter.    Procedures: No procedures performed  Clinical Data: No additional findings.  ROS:  All other systems negative, except as noted in the HPI. Review of Systems  Objective: Vital Signs: There were no vitals taken for this visit.  Specialty Comments:  No specialty comments available.  PMFS History: Patient Active Problem List   Diagnosis Date Noted  . Ulcer of right foot, with necrosis of bone ([place])   . Cellulitis of right lower extremity 01/01/2019  . Diabetic neurogenic arthropathy ([place]) 03/14/2018  . Ulcer of toe of left foot, limited to breakdown of skin ([place]) 03/14/2018  . ADD (attention deficit disorder) 11/13/2017  . Anxiety 11/13/2017  . HTN (hypertension) 11/13/2017  . Pre-diabetes 11/13/2017  . Osteomyelitis of fifth toe of right foot ([place])   . Sepsis (  [place]) 11/11/2017  . Severe sepsis ([place]) 11/11/2017  . Peripheral neuropathy 04/14/2013   Past Medical History:  Diagnosis Date  . ADD (attention deficit disorder)    adult  . ADD (attention deficit disorder)   . Anxiety and depression   . Arthritis    "probably a touch in my hands" (11/12/2017)  . Diabetes mellitus without complication ([place])   . GERD (gastroesophageal reflux disease)   . Hypertension   . Migraine    "maybe 2 in my lifetime" (11/12/2017)  . MRSA infection (methicillin-resistant Staphylococcus aureus)   . Neuropathy   . OSA on CPAP     not using at this time  . Restless legs     Family History  Problem Relation Age of Onset  . Cancer Mother   . Kidney failure Father   . Breast cancer Sister     Past Surgical History:   Procedure Laterality Date  . AMPUTATION Left 12/17/2017   Procedure: LEFT 2ND RAY AMPUTATION;  Surgeon: Newt Minion, MD;  Location: [facility];  Service: Orthopedics;  Laterality: Left;  . AMPUTATION Left    [facility]  . AMPUTATION Right 02/07/2021   Procedure: RIGHT FOOT 5TH RAY AMPUTATION;  Surgeon: Newt Minion, MD;  Location: [place];  Service: Orthopedics;  Laterality: Right;  . WISDOM TOOTH EXTRACTION     Social History   Occupational History    Comment: [employer]  Tobacco Use  . Smoking status: Current Every Day Smoker    Packs/day: 0.40    Years: 33.00    Pack years: 13.20    Types: Cigarettes  . Smokeless tobacco: Never Used  Vaping Use  . Vaping Use: Never used  Substance and Sexual Activity  . Alcohol use: Yes    Alcohol/week: 6.0 standard drinks    Types: 6 Standard drinks or equivalent per week  . Drug use: No  . Sexual activity: Yes

## 2021-03-13 ENCOUNTER — Ambulatory Visit (INDEPENDENT_AMBULATORY_CARE_PROVIDER_SITE_OTHER): Payer: 59 | Admitting: Physician Assistant

## 2021-03-13 ENCOUNTER — Encounter: Payer: Self-pay | Admitting: Orthopedic Surgery

## 2021-03-13 DIAGNOSIS — L97514 Non-pressure chronic ulcer of other part of right foot with necrosis of bone: Secondary | ICD-10-CM

## 2021-03-13 NOTE — Progress Notes (Signed)
Office Visit Note   Patient: Johnny Martinez           Date of Birth: 1971/10/21           MRN: [id number] Visit Date: 03/13/2021              Requested by: Curly Rim, MD [place],  [place] [id number] PCP: Curly Rim, MD  Chief Complaint  Patient presents with  . Right Foot - Routine Post Op    02/07/21 right foot 5th ray amputation       HPI: Patient is 6 weeks status post right foot fifth ray amputation.  Overall he is doing well has been wearing his compression medical socks.  Is not really getting any drainage.  Assessment & Plan: Visit Diagnoses: No diagnosis found.  Plan: He may transition to a supportive shoe and he has several pairs of Hoka's that are wide and accommodating.  Follow-up in 1 month  Follow-Up Instructions: No follow-ups on file.   Ortho Exam  Patient is alert, oriented, no adenopathy, well-dressed, normal affect, normal respiratory effort. Examination overall well-healed incision.  Distally he just has a small superficial area of dehiscence that only goes about a millimeter in and is about 3 mm in length.  Has some surrounding callus has a good healthy base that does not tunnel.  He has no ascending cellulitis or signs of infection  Imaging: No results found. No images are attached to the encounter.  Labs: Lab Results  Component Value Date   HGBA1C 6.9 (H) 01/01/2019   HGBA1C 6.6 (H) 12/17/2017   ESRSEDRATE 9 04/14/2013   CRP 2.4 04/14/2013   REPTSTATUS 01/06/2019 FINAL 01/01/2019   CULT  01/01/2019    NO GROWTH 5 DAYS Performed at [facility], [address], [place], [place] [address]      Lab Results  Component Value Date   ALBUMIN 3.5 11/11/2017    Lab Results  Component Value Date   MG 1.6 (L) 11/12/2017   No results found for: VD25OH  No results found for: PREALBUMIN CBC EXTENDED Latest Ref Rng & Units 01/02/2019 01/01/2019 01/01/2019  WBC 4.0 - 10.5 K/uL 8.1 8.2 7.8  RBC 4.22 - 5.81 MIL/uL  4.05(L) 4.23 4.46  HGB 13.0 - 17.0 g/dL 12.9(L) 13.4 14.5  HCT 39.0 - 52.0 % 40.4 41.6 44.8  PLT 150 - 400 K/uL 231 234 250  NEUTROABS 1.7 - 7.7 K/uL - 5.6 6.1  LYMPHSABS 0.7 - 4.0 K/uL - 1.3 1.0     There is no height or weight on file to calculate BMI.  Orders:  No orders of the defined types were placed in this encounter.  No orders of the defined types were placed in this encounter.    Procedures: No procedures performed  Clinical Data: No additional findings.  ROS:  All other systems negative, except as noted in the HPI. Review of Systems  Objective: Vital Signs: There were no vitals taken for this visit.  Specialty Comments:  No specialty comments available.  PMFS History: Patient Active Problem List   Diagnosis Date Noted  . Ulcer of right foot, with necrosis of bone ([place])   . Cellulitis of right lower extremity 01/01/2019  . Diabetic neurogenic arthropathy ([place]) 03/14/2018  . Ulcer of toe of left foot, limited to breakdown of skin ([place]) 03/14/2018  . ADD (attention deficit disorder) 11/13/2017  . Anxiety 11/13/2017  . HTN (hypertension) 11/13/2017  . Pre-diabetes 11/13/2017  .  Osteomyelitis of fifth toe of right foot ([place])   . Sepsis ([place]) 11/11/2017  . Severe sepsis ([place]) 11/11/2017  . Peripheral neuropathy 04/14/2013   Past Medical History:  Diagnosis Date  . ADD (attention deficit disorder)    adult  . ADD (attention deficit disorder)   . Anxiety and depression   . Arthritis    "probably a touch in my hands" (11/12/2017)  . Diabetes mellitus without complication ([place])   . GERD (gastroesophageal reflux disease)   . Hypertension   . Migraine    "maybe 2 in my lifetime" (11/12/2017)  . MRSA infection (methicillin-resistant Staphylococcus aureus)   . Neuropathy   . OSA on CPAP     not using at this time  . Restless legs     Family History  Problem Relation Age of Onset  . Cancer Mother   . Kidney failure Father   . Breast cancer Sister      Past Surgical History:  Procedure Laterality Date  . AMPUTATION Left 12/17/2017   Procedure: LEFT 2ND RAY AMPUTATION;  Surgeon: Newt Minion, MD;  Location: [facility];  Service: Orthopedics;  Laterality: Left;  . AMPUTATION Left    foot x 2 at [facility]  . AMPUTATION Right 02/07/2021   Procedure: RIGHT FOOT 5TH RAY AMPUTATION;  Surgeon: Newt Minion, MD;  Location: [place];  Service: Orthopedics;  Laterality: Right;  . WISDOM TOOTH EXTRACTION     Social History   Occupational History    Comment: [employer]  Tobacco Use  . Smoking status: Current Every Day Smoker    Packs/day: 0.40    Years: 33.00    Pack years: 13.20    Types: Cigarettes  . Smokeless tobacco: Never Used  Vaping Use  . Vaping Use: Never used  Substance and Sexual Activity  . Alcohol use: Yes    Alcohol/week: 6.0 standard drinks    Types: 6 Standard drinks or equivalent per week  . Drug use: No  . Sexual activity: Yes

## 2021-04-10 ENCOUNTER — Ambulatory Visit (INDEPENDENT_AMBULATORY_CARE_PROVIDER_SITE_OTHER): Payer: 59 | Admitting: Orthopedic Surgery

## 2021-04-10 ENCOUNTER — Encounter: Payer: Self-pay | Admitting: Orthopedic Surgery

## 2021-04-10 ENCOUNTER — Ambulatory Visit: Payer: Self-pay

## 2021-04-10 DIAGNOSIS — S92301A Fracture of unspecified metatarsal bone(s), right foot, initial encounter for closed fracture: Secondary | ICD-10-CM

## 2021-04-10 DIAGNOSIS — M79671 Pain in right foot: Secondary | ICD-10-CM

## 2021-04-10 NOTE — Progress Notes (Signed)
Office Visit Note   Patient: Johnny Martinez           Date of Birth: 07-09-1971           MRN: [id number] Visit Date: 04/10/2021              Requested by: Curly Rim, MD [place],  [place] [address] PCP: Curly Rim, MD  Chief Complaint  Patient presents with   Right Foot - Routine Post Op    02/07/21 right foot 5th ray amputation       HPI: Patient is a 50 year old gentleman who presents for 2 separate issues.  #1 he is 2 months status post right foot fifth ray amputation he states he has been asymptomatic from this.  He states that last week he developed acute redness swelling and warmth in the foot he was given a injection of Rocephin by his primary care physician he was also given a steroid injection he states that recently has been on clindamycin and a steroid taper.  He states that he was resumed taking his Lasix.  Assessment & Plan: Visit Diagnoses:  1. Pain in right foot   2. Closed nondisplaced fracture of metatarsal bone of right foot, unspecified metatarsal, initial encounter     Plan: Plan: This appears to be more venous insufficiency as well as acute Charcot fracture through the second and third metatarsal necks.  We will place him in a postoperative shoe recommended that he get a carbon plate to unload pressure from the metatarsals.  Recommend he wear his knee-high compression stockings these were applied in the office.  Reevaluate in 4 weeks with three-view radiographs of the right foot.  Discussed that if he has any acute redness or increased pain or swelling to follow-up immediately this may be further changes with the Charcot arthropathy.  Follow-Up Instructions: Return in about 4 weeks (around 05/08/2021).   Ortho Exam  Patient is alert, oriented, no adenopathy, well-dressed, normal affect, normal respiratory effort. Examination patient does have brawny edema in the right lower extremity with venous insufficiency and varicose veins.   He has a good palpable dorsalis pedis pulse his foot is nontender to palpation the fifth ray amputation is well-healed no clinical signs of infection.  The radiographs are consistent with a stress fracture through the second and third metatarsal necks most likely secondary to a Charcot arthropathy.  Imaging: XR Foot 2 Views Right  Result Date: 04/10/2021 2 view radiographs of the right foot shows previous fifth ray amputation without any air in the soft tissue.  Radiographs do show metatarsal neck fractures through the second and third metatarsal.  No images are attached to the encounter.  Labs: Lab Results  Component Value Date   HGBA1C 6.9 (H) 01/01/2019   HGBA1C 6.6 (H) 12/17/2017   ESRSEDRATE 9 04/14/2013   CRP 2.4 04/14/2013   REPTSTATUS 01/06/2019 FINAL 01/01/2019   CULT  01/01/2019    NO GROWTH 5 DAYS Performed at [facility], [address] [place], [place] [address]      Lab Results  Component Value Date   ALBUMIN 3.5 11/11/2017    Lab Results  Component Value Date   MG 1.6 (L) 11/12/2017   No results found for: VD25OH  No results found for: PREALBUMIN CBC EXTENDED Latest Ref Rng & Units 01/02/2019 01/01/2019 01/01/2019  WBC 4.0 - 10.5 K/uL 8.1 8.2 7.8  RBC 4.22 - 5.81 MIL/uL 4.05(L) 4.23 4.46  HGB 13.0 -  17.0 g/dL 12.9(L) 13.4 14.5  HCT 39.0 - 52.0 % 40.4 41.6 44.8  PLT 150 - 400 K/uL 231 234 250  NEUTROABS 1.7 - 7.7 K/uL - 5.6 6.1  LYMPHSABS 0.7 - 4.0 K/uL - 1.3 1.0     There is no height or weight on file to calculate BMI.  Orders:  Orders Placed This Encounter  Procedures   XR Foot 2 Views Right   No orders of the defined types were placed in this encounter.    Procedures: No procedures performed  Clinical Data: No additional findings.  ROS:  All other systems negative, except as noted in the HPI. Review of Systems  Objective: Vital Signs: There were no vitals taken for this visit.  Specialty Comments:  No specialty comments  available.  PMFS History: Patient Active Problem List   Diagnosis Date Noted   Ulcer of right foot, with necrosis of bone ([place])    Cellulitis of right lower extremity 01/01/2019   Diabetic neurogenic arthropathy ([place]) 03/14/2018   Ulcer of toe of left foot, limited to breakdown of skin ([place]) 03/14/2018   ADD (attention deficit disorder) 11/13/2017   Anxiety 11/13/2017   HTN (hypertension) 11/13/2017   Pre-diabetes 11/13/2017   Osteomyelitis of fifth toe of right foot (HCC)    Sepsis ([place]) 11/11/2017   Severe sepsis ([place]) 11/11/2017   Peripheral neuropathy 04/14/2013   Past Medical History:  Diagnosis Date   ADD (attention deficit disorder)    adult   ADD (attention deficit disorder)    Anxiety and depression    Arthritis    "probably a touch in my hands" (11/12/2017)   Diabetes mellitus without complication (HCC)    GERD (gastroesophageal reflux disease)    Hypertension    Migraine    "maybe 2 in my lifetime" (11/12/2017)   MRSA infection (methicillin-resistant Staphylococcus aureus)    Neuropathy    OSA on CPAP     not using at this time   Restless legs     Family History  Problem Relation Age of Onset   Cancer Mother    Kidney failure Father    Breast cancer Sister     Past Surgical History:  Procedure Laterality Date   AMPUTATION Left 12/17/2017   Procedure: LEFT 2ND RAY AMPUTATION;  Surgeon: Newt Minion, MD;  Location: [facility];  Service: Orthopedics;  Laterality: Left;   AMPUTATION Left    foot x 2 at [place] Right 02/07/2021   Procedure: RIGHT FOOT 5TH RAY AMPUTATION;  Surgeon: Newt Minion, MD;  Location: [facility];  Service: Orthopedics;  Laterality: Right;   WISDOM TOOTH EXTRACTION     Social History   Occupational History    Comment: [employer]  Tobacco Use   Smoking status: Every Day    Packs/day: 0.40    Years: 33.00    Pack years: 13.20    Types: Cigarettes   Smokeless tobacco: Never  Vaping Use   Vaping Use: Never used  Substance and Sexual  Activity   Alcohol use: Yes    Alcohol/week: 6.0 standard drinks    Types: 6 Standard drinks or equivalent per week   Drug use: No   Sexual activity: Yes

## 2021-05-14 ENCOUNTER — Encounter: Payer: Self-pay | Admitting: Family

## 2021-05-14 ENCOUNTER — Ambulatory Visit (INDEPENDENT_AMBULATORY_CARE_PROVIDER_SITE_OTHER): Payer: 59 | Admitting: Family

## 2021-05-14 ENCOUNTER — Ambulatory Visit: Payer: Self-pay

## 2021-05-14 ENCOUNTER — Other Ambulatory Visit: Payer: Self-pay

## 2021-05-14 VITALS — Ht 74.0 in | Wt 280.0 lb

## 2021-05-14 DIAGNOSIS — M79672 Pain in left foot: Secondary | ICD-10-CM

## 2021-05-14 DIAGNOSIS — M79671 Pain in right foot: Secondary | ICD-10-CM

## 2021-05-14 NOTE — Progress Notes (Signed)
Office Visit Note   Patient: Johnny Martinez           Date of Birth: January 27, 1971           MRN: [id number] Visit Date: 05/14/2021              Requested by: Curly Rim, MD [place],  [place] [id number] PCP: Curly Rim, MD  Chief Complaint  Patient presents with   Right Foot - Follow-up    02/07/2021 Right foot 5th ray amputation   ulcer left foot      HPI: The patient is a 50 year old gentleman seen today in follow-up.  He is seen for 3 separate issues. #1 he is status post right foot fifth ray amputation no pain from the amputation however he has had ongoing issues with pain in the foot with Charcot fractures through the second and third metatarsal necks.  He was attempting to get a carbon fiber plate but has been unable to do so in the last few weeks.  #2 bilateral lower extremity edema.  At last visit was placed in knee-high compression stockings these were applied in the office has been wearing them for the last 4 weeks.  He is also taking his Lasix as prescribed  #3 unfortunately has a new ulcer beneath his left transmetatarsal amputation he states [place] the [place] unfortunately a callused area beneath what appears to be the distal third metatarsal avulsed and underneath the callus was ulcer.  He has not had any fever or chills or drainage he has been applying dry dressings does have Silvadene at home  Assessment & Plan: Visit Diagnoses:  1. Pain in right foot   2. Pain in left foot     Plan: For his venous insufficiency he will continue with his bilateral knee-high compression garments.  We will go ahead and place an order through [employer] for him to obtain custom orthotics with a carbon fiber plate.  Patient eager to get into proper shoe wear for the [facility] grade 1 ulcer on the left foot.  Silver cell dressing applied in office today.  He will use a Darco shoe which she already owns he will use Silvadene after daily wound cleansing    Discussed that if he has any acute redness or increased pain or swelling to follow-up immediately this may be further changes with the Charcot arthropathy.  Follow-Up Instructions: Return in about 15 days (around 05/29/2021).   Ortho Exam  Patient is alert, oriented, no adenopathy, well-dressed, normal affect, normal respiratory effort.  On examination bilateral lower extremities there is brawny skin color changes with pitting edema this is 1+.  He does have a palpable dorsalis pedis pulse on the right the fifth ray amputation is well-healed there is no erythema no warmth no clinical signs of infection.  The left transmetatarsal amputation is well-healed unfortunately beneath the center of his foot there is a Wagner grade 1 ulcer this is dime sized there is surrounding maceration and callus tissue this was debrided with a 10 blade knife back to viable tissue there is 4 mm of depth with granulation in the wound bed no active drainage   Imaging: No results found. No images are attached to the encounter.  Labs: Lab Results  Component Value Date   HGBA1C 6.9 (H) 01/01/2019   HGBA1C 6.6 (H) 12/17/2017   ESRSEDRATE 9 04/14/2013   CRP 2.4 04/14/2013   REPTSTATUS 01/06/2019 FINAL 01/01/2019  CULT  01/01/2019    NO GROWTH 5 DAYS Performed at [facility], [address] [place], [place] [address]      Lab Results  Component Value Date   ALBUMIN 3.5 11/11/2017    Lab Results  Component Value Date   MG 1.6 (L) 11/12/2017   No results found for: VD25OH  No results found for: PREALBUMIN CBC EXTENDED Latest Ref Rng & Units 01/02/2019 01/01/2019 01/01/2019  WBC 4.0 - 10.5 K/uL 8.1 8.2 7.8  RBC 4.22 - 5.81 MIL/uL 4.05(L) 4.23 4.46  HGB 13.0 - 17.0 g/dL 12.9(L) 13.4 14.5  HCT 39.0 - 52.0 % 40.4 41.6 44.8  PLT 150 - 400 K/uL 231 234 250  NEUTROABS 1.7 - 7.7 K/uL - 5.6 6.1  LYMPHSABS 0.7 - 4.0 K/uL - 1.3 1.0     Body mass index is 35.95 kg/m.  Orders:  Orders Placed This Encounter   Procedures   XR Foot Complete Right   XR Foot Complete Left   No orders of the defined types were placed in this encounter.    Procedures: No procedures performed  Clinical Data: No additional findings.  ROS:  All other systems negative, except as noted in the HPI. Review of Systems  Constitutional:  Negative for chills and fever.  Cardiovascular:  Positive for leg swelling.  Musculoskeletal:  Positive for arthralgias and myalgias.  Skin:  Positive for color change and wound.   Objective: Vital Signs: Ht 6\' 2"  (1.88 m)   Wt 280 lb (127 kg)   BMI 35.95 kg/m   Specialty Comments:  No specialty comments available.  PMFS History: Patient Active Problem List   Diagnosis Date Noted   Ulcer of right foot, with necrosis of bone ([place])    Cellulitis of right lower extremity 01/01/2019   Diabetic neurogenic arthropathy ([place]) 03/14/2018   Ulcer of toe of left foot, limited to breakdown of skin ([place]) 03/14/2018   ADD (attention deficit disorder) 11/13/2017   Anxiety 11/13/2017   HTN (hypertension) 11/13/2017   Pre-diabetes 11/13/2017   Osteomyelitis of fifth toe of right foot (HCC)    Sepsis ([place]) 11/11/2017   Severe sepsis ([place]) 11/11/2017   Peripheral neuropathy 04/14/2013   Past Medical History:  Diagnosis Date   ADD (attention deficit disorder)    adult   ADD (attention deficit disorder)    Anxiety and depression    Arthritis    "probably a touch in my hands" (11/12/2017)   Diabetes mellitus without complication (HCC)    GERD (gastroesophageal reflux disease)    Hypertension    Migraine    "maybe 2 in my lifetime" (11/12/2017)   MRSA infection (methicillin-resistant Staphylococcus aureus)    Neuropathy    OSA on CPAP     not using at this time   Restless legs     Family History  Problem Relation Age of Onset   Cancer Mother    Kidney failure Father    Breast cancer Sister     Past Surgical History:  Procedure Laterality Date   AMPUTATION Left 12/17/2017    Procedure: LEFT 2ND RAY AMPUTATION;  Surgeon: Newt Minion, MD;  Location: [facility];  Service: Orthopedics;  Laterality: Left;   AMPUTATION Left    foot x 2 at [place] Right 02/07/2021   Procedure: RIGHT FOOT 5TH RAY AMPUTATION;  Surgeon: Newt Minion, MD;  Location: [facility];  Service: Orthopedics;  Laterality: Right;   [place] EXTRACTION     Social  History   Occupational History    Comment: [employer]  Tobacco Use   Smoking status: Every Day    Packs/day: 0.40    Years: 33.00    Pack years: 13.20    Types: Cigarettes   Smokeless tobacco: Never  Vaping Use   Vaping Use: Never used  Substance and Sexual Activity   Alcohol use: Yes    Alcohol/week: 6.0 standard drinks    Types: 6 Standard drinks or equivalent per week   Drug use: No   Sexual activity: Yes

## 2021-05-29 ENCOUNTER — Ambulatory Visit (INDEPENDENT_AMBULATORY_CARE_PROVIDER_SITE_OTHER): Payer: 59 | Admitting: Orthopedic Surgery

## 2021-05-29 ENCOUNTER — Other Ambulatory Visit: Payer: Self-pay

## 2021-05-29 DIAGNOSIS — Z89421 Acquired absence of other right toe(s): Secondary | ICD-10-CM | POA: Diagnosis not present

## 2021-05-29 DIAGNOSIS — L97528 Non-pressure chronic ulcer of other part of left foot with other specified severity: Secondary | ICD-10-CM

## 2021-05-29 DIAGNOSIS — L97521 Non-pressure chronic ulcer of other part of left foot limited to breakdown of skin: Secondary | ICD-10-CM

## 2021-06-24 ENCOUNTER — Encounter: Payer: Self-pay | Admitting: Orthopedic Surgery

## 2021-06-24 NOTE — Progress Notes (Signed)
Office Visit Note   Patient: Johnny Martinez           Date of Birth: 08/26/1971           MRN: [id number] Visit Date: 05/29/2021              Requested by: Curly Rim, MD [place],  [place] [id number] PCP: Curly Rim, MD  Chief Complaint  Patient presents with   Right Foot - Follow-up    02/07/21 right foot 5th ray amputation    Left Foot - Follow-up      HPI: Patient is a 50 year old gentleman who is 4 months status post right foot fifth ray amputation he states that it is well-healed he has no complaints.  Patient has a transmetatarsal amputation on the left foot.  Assessment & Plan: Visit Diagnoses:  1. History of partial ray amputation of fifth toe of right foot ([place])   2. Non-pressure chronic ulcer of other part of left foot limited to breakdown of skin ([place])     Plan: Ulcer was debrided on the left foot of skin and soft tissue.  Recommend continue with dressing changes recommend Darco shoe.  Follow-Up Instructions: Return in about 4 weeks (around 06/26/2021).   Ortho Exam  Patient is alert, oriented, no adenopathy, well-dressed, normal affect, normal respiratory effort. Examination patient has a Wagner grade 1 ulcer in the left transmetatarsal amputation that is 10 mm in diameter.  After informed consent a 10 blade knife was used to debride the skin and soft tissue back to healthy viable tissue.  After debridement the ulcer is 2 cm in diameter 3 mm deep silver nitrate was used for hemostasis.  Examination the right foot the fifth ray amputation is well-healed no ulcers no cellulitis.  Imaging: No results found. No images are attached to the encounter.  Labs: Lab Results  Component Value Date   HGBA1C 6.9 (H) 01/01/2019   HGBA1C 6.6 (H) 12/17/2017   ESRSEDRATE 9 04/14/2013   CRP 2.4 04/14/2013   REPTSTATUS 01/06/2019 FINAL 01/01/2019   CULT  01/01/2019    NO GROWTH 5 DAYS Performed at [facility], [address], [place],  [place] [address]      Lab Results  Component Value Date   ALBUMIN 3.5 11/11/2017    Lab Results  Component Value Date   MG 1.6 (L) 11/12/2017   No results found for: VD25OH  No results found for: PREALBUMIN CBC EXTENDED Latest Ref Rng & Units 01/02/2019 01/01/2019 01/01/2019  WBC 4.0 - 10.5 K/uL 8.1 8.2 7.8  RBC 4.22 - 5.81 MIL/uL 4.05(L) 4.23 4.46  HGB 13.0 - 17.0 g/dL 12.9(L) 13.4 14.5  HCT 39.0 - 52.0 % 40.4 41.6 44.8  PLT 150 - 400 K/uL 231 234 250  NEUTROABS 1.7 - 7.7 K/uL - 5.6 6.1  LYMPHSABS 0.7 - 4.0 K/uL - 1.3 1.0     There is no height or weight on file to calculate BMI.  Orders:  No orders of the defined types were placed in this encounter.  No orders of the defined types were placed in this encounter.    Procedures: No procedures performed  Clinical Data: No additional findings.  ROS:  All other systems negative, except as noted in the HPI. Review of Systems  Objective: Vital Signs: There were no vitals taken for this visit.  Specialty Comments:  No specialty comments available.  PMFS History: Patient Active Problem List   Diagnosis Date  Noted   Ulcer of right foot, with necrosis of bone ([place])    Cellulitis of right lower extremity 01/01/2019   Diabetic neurogenic arthropathy ([place]) 03/14/2018   Ulcer of toe of left foot, limited to breakdown of skin ([place]) 03/14/2018   ADD (attention deficit disorder) 11/13/2017   Anxiety 11/13/2017   HTN (hypertension) 11/13/2017   Pre-diabetes 11/13/2017   Osteomyelitis of fifth toe of right foot ([place])    Sepsis ([place]) 11/11/2017   Severe sepsis ([place]) 11/11/2017   Peripheral neuropathy 04/14/2013   Past Medical History:  Diagnosis Date   ADD (attention deficit disorder)    adult   ADD (attention deficit disorder)    Anxiety and depression    Arthritis    "probably a touch in my hands" (11/12/2017)   Diabetes mellitus without complication (HCC)    GERD (gastroesophageal reflux disease)    Hypertension     Migraine    "maybe 2 in my lifetime" (11/12/2017)   MRSA infection (methicillin-resistant Staphylococcus aureus)    Neuropathy    OSA on CPAP     not using at this time   Restless legs     Family History  Problem Relation Age of Onset   Cancer Mother    Kidney failure Father    Breast cancer Sister     Past Surgical History:  Procedure Laterality Date   AMPUTATION Left 12/17/2017   Procedure: LEFT 2ND RAY AMPUTATION;  Surgeon: Newt Minion, MD;  Location: [facility];  Service: Orthopedics;  Laterality: Left;   AMPUTATION Left    foot x 2 at [place] Right 02/07/2021   Procedure: RIGHT FOOT 5TH RAY AMPUTATION;  Surgeon: Newt Minion, MD;  Location: [facility];  Service: Orthopedics;  Laterality: Right;   WISDOM TOOTH EXTRACTION     Social History   Occupational History    Comment: [employer]  Tobacco Use   Smoking status: Every Day    Packs/day: 0.40    Years: 33.00    Pack years: 13.20    Types: Cigarettes   Smokeless tobacco: Never  Vaping Use   Vaping Use: Never used  Substance and Sexual Activity   Alcohol use: Yes    Alcohol/week: 6.0 standard drinks    Types: 6 Standard drinks or equivalent per week   Drug use: No   Sexual activity: Yes

## 2021-06-26 ENCOUNTER — Ambulatory Visit (INDEPENDENT_AMBULATORY_CARE_PROVIDER_SITE_OTHER): Payer: 59 | Admitting: Orthopedic Surgery

## 2021-06-26 ENCOUNTER — Encounter: Payer: Self-pay | Admitting: Orthopedic Surgery

## 2021-06-26 DIAGNOSIS — Z89421 Acquired absence of other right toe(s): Secondary | ICD-10-CM | POA: Diagnosis not present

## 2021-06-26 DIAGNOSIS — L97521 Non-pressure chronic ulcer of other part of left foot limited to breakdown of skin: Secondary | ICD-10-CM | POA: Diagnosis not present

## 2021-06-26 DIAGNOSIS — L97428 Non-pressure chronic ulcer of left heel and midfoot with other specified severity: Secondary | ICD-10-CM | POA: Diagnosis not present

## 2021-06-26 NOTE — Progress Notes (Signed)
Office Visit Note   Patient: Johnny Martinez           Date of Birth: August 20, 1971           MRN: [id number] Visit Date: 06/26/2021              Requested by: Curly Rim, MD [place],  [place] [address] PCP: Curly Rim, MD  Chief Complaint  Patient presents with   Other     Right Foot - Follow-up     02/07/21 right foot 5th ray amputation   Left Foot - Follow-up         HPI: Patient is a 50 year old gentleman who presents in follow-up for both lower extremities.  He is status post the fifth ray amputation on the right and status post a transmetatarsal amputation the left with a Wagner grade 1 ulcer beneath the forefoot.  Patient states he was using his Darco shoe when he stepped on the edge of the driveway causing him to fall.  Assessment & Plan: Visit Diagnoses:  1. History of partial ray amputation of fifth toe of right foot ([place])   2. Non-pressure chronic ulcer of other part of left foot limited to breakdown of skin ([place])     Plan: Protected weightbearing or use of the Darco for the left foot.  Recommended compression for the venous stasis changes in both lower extremities.  Ulcer debrided of skin and soft tissue reevaluate at follow-up.  Follow-Up Instructions: Return in about 4 weeks (around 07/24/2021).   Ortho Exam  Patient is alert, oriented, no adenopathy, well-dressed, normal affect, normal respiratory effort. Patient right foot fifth ray amputation is well-healed there is no redness no cellulitis no signs of infection.  Examination the left foot the transmetatarsal amputation is well-healed he has a Wagner grade 1 ulcer on the plantar aspect of the forefoot.  After informed consent a 10 blade knife was used to debride the skin and soft tissue back to healthy viable granulation tissue this was touched with silver nitrate the ulcer is 2 cm in diameter and 5 mm deep.  There is no exposed bone or tendon and there is good granulation tissue  in the ulcer bed.  A dry dressing was applied.  Patient does have venous stasis swelling of both lower extremities.  He has onychomycotic nails x4 on the right foot and these were trimmed without complications.  Imaging: No results found. No images are attached to the encounter.  Labs: Lab Results  Component Value Date   HGBA1C 6.9 (H) 01/01/2019   HGBA1C 6.6 (H) 12/17/2017   ESRSEDRATE 9 04/14/2013   CRP 2.4 04/14/2013   REPTSTATUS 01/06/2019 FINAL 01/01/2019   CULT  01/01/2019    NO GROWTH 5 DAYS Performed at [facility], [address], [place], [place] [address]      Lab Results  Component Value Date   ALBUMIN 3.5 11/11/2017    Lab Results  Component Value Date   MG 1.6 (L) 11/12/2017   No results found for: VD25OH  No results found for: PREALBUMIN CBC EXTENDED Latest Ref Rng & Units 01/02/2019 01/01/2019 01/01/2019  WBC 4.0 - 10.5 K/uL 8.1 8.2 7.8  RBC 4.22 - 5.81 MIL/uL 4.05(L) 4.23 4.46  HGB 13.0 - 17.0 g/dL 12.9(L) 13.4 14.5  HCT 39.0 - 52.0 % 40.4 41.6 44.8  PLT 150 - 400 K/uL 231 234 250  NEUTROABS 1.7 - 7.7 K/uL - 5.6 6.1  LYMPHSABS 0.7 -  4.0 K/uL - 1.3 1.0     There is no height or weight on file to calculate BMI.  Orders:  No orders of the defined types were placed in this encounter.  No orders of the defined types were placed in this encounter.    Procedures: No procedures performed  Clinical Data: No additional findings.  ROS:  All other systems negative, except as noted in the HPI. Review of Systems  Objective: Vital Signs: There were no vitals taken for this visit.  Specialty Comments:  No specialty comments available.  PMFS History: Patient Active Problem List   Diagnosis Date Noted   Ulcer of right foot, with necrosis of bone ([place])    Cellulitis of right lower extremity 01/01/2019   Diabetic neurogenic arthropathy ([place]) 03/14/2018   Ulcer of toe of left foot, limited to breakdown of skin ([place]) 03/14/2018   ADD (attention deficit  disorder) 11/13/2017   Anxiety 11/13/2017   HTN (hypertension) 11/13/2017   Pre-diabetes 11/13/2017   Osteomyelitis of fifth toe of right foot (HCC)    Sepsis ([place]) 11/11/2017   Severe sepsis ([place]) 11/11/2017   Peripheral neuropathy 04/14/2013   Past Medical History:  Diagnosis Date   ADD (attention deficit disorder)    adult   ADD (attention deficit disorder)    Anxiety and depression    Arthritis    "probably a touch in my hands" (11/12/2017)   Diabetes mellitus without complication (HCC)    GERD (gastroesophageal reflux disease)    Hypertension    Migraine    "maybe 2 in my lifetime" (11/12/2017)   MRSA infection (methicillin-resistant Staphylococcus aureus)    Neuropathy    OSA on CPAP     not using at this time   Restless legs     Family History  Problem Relation Age of Onset   Cancer Mother    Kidney failure Father    Breast cancer Sister     Past Surgical History:  Procedure Laterality Date   AMPUTATION Left 12/17/2017   Procedure: LEFT 2ND RAY AMPUTATION;  Surgeon: Newt Minion, MD;  Location: [facility];  Service: Orthopedics;  Laterality: Left;   AMPUTATION Left    foot x 2 at [place] Right 02/07/2021   Procedure: RIGHT FOOT 5TH RAY AMPUTATION;  Surgeon: Newt Minion, MD;  Location: [facility];  Service: Orthopedics;  Laterality: Right;   WISDOM TOOTH EXTRACTION     Social History   Occupational History    Comment: [employer]  Tobacco Use   Smoking status: Every Day    Packs/day: 0.40    Years: 33.00    Pack years: 13.20    Types: Cigarettes   Smokeless tobacco: Never  Vaping Use   Vaping Use: Never used  Substance and Sexual Activity   Alcohol use: Yes    Alcohol/week: 6.0 standard drinks    Types: 6 Standard drinks or equivalent per week   Drug use: No   Sexual activity: Yes

## 2021-07-14 ENCOUNTER — Telehealth: Payer: Self-pay | Admitting: Orthopedic Surgery

## 2021-07-14 NOTE — Telephone Encounter (Signed)
I will let [facility] know to hold until next appt.

## 2021-07-14 NOTE — Telephone Encounter (Signed)
Patient has submitted [place] forms. Please advise work status and provide note.

## 2021-07-14 NOTE — Telephone Encounter (Signed)
There was nothing dictated in the pt chart that he was to be out of work. I am assuming that we could say out until next visit which is next Wednesday. Can we hold forms pending his office visit and then update at that time?

## 2021-07-24 ENCOUNTER — Ambulatory Visit (INDEPENDENT_AMBULATORY_CARE_PROVIDER_SITE_OTHER): Payer: 59 | Admitting: Orthopedic Surgery

## 2021-07-24 ENCOUNTER — Encounter: Payer: Self-pay | Admitting: Orthopedic Surgery

## 2021-07-24 DIAGNOSIS — L97511 Non-pressure chronic ulcer of other part of right foot limited to breakdown of skin: Secondary | ICD-10-CM

## 2021-07-24 DIAGNOSIS — I872 Venous insufficiency (chronic) (peripheral): Secondary | ICD-10-CM | POA: Diagnosis not present

## 2021-07-24 DIAGNOSIS — L97512 Non-pressure chronic ulcer of other part of right foot with fat layer exposed: Secondary | ICD-10-CM

## 2021-07-24 DIAGNOSIS — Z89432 Acquired absence of left foot: Secondary | ICD-10-CM

## 2021-07-24 NOTE — Progress Notes (Signed)
Office Visit Note   Patient: Johnny Martinez           Date of Birth: [birth year]           MRN: [id number] Visit Date: 07/24/2021              Requested by: Curly Rim, MD [place],  [place] [address] PCP: Curly Rim, MD  Chief Complaint  Patient presents with   Left Foot - Pain   Right Foot - Pain      HPI: Patient is a 50 year old gentleman who was seen for 2 separate issues.  #1 he is status post a right foot fifth ray amputation.  #2 he is status post a transmetatarsal amputation of the left with venous swelling bilaterally and an ulcer beneath the transmetatarsal head with a Wagner grade 1 ulcer.   Assessment & Plan: Visit Diagnoses:  1. History of transmetatarsal amputation of left foot ([place])   2. Non-pressure chronic ulcer of other part of right foot limited to breakdown of skin (HCC)   3. Venous insufficiency (chronic) (peripheral)     Plan: Patient is permanently disabled from working on his feet.  Patient has bilateral partial foot amputations with an ulcer on the left foot venous insufficiency elevated BMI and diabetic insensate neuropathy.  A felt relieving donut was placed beneath the ulceration left foot.  Follow-Up Instructions: Return in about 3 weeks (around 08/14/2021).   Ortho Exam  Patient is alert, oriented, no adenopathy, well-dressed, normal affect, normal respiratory effort. Examination patient has venous swelling of both legs.  He has a healed fifth ray amputation on the right foot and a Wagner grade 1 ulcer beneath the transmetatarsal amputation on the left.  After informed consent a 10 blade knife was used to debride the skin and soft tissue back to healthy granulation tissue this was touched with silver nitrate.  The ulcer was 1 cm diameter prior to debridement 2 cm in diameter after debridement and 5 mm deep.  Patient will use his silver alginate and Silvadene to use ultimately.  We will place a felt relieving donut to  unload the metatarsal ulcer.    Imaging: No results found. No images are attached to the encounter.  Labs: Lab Results  Component Value Date   HGBA1C 6.9 (H) 01/01/2019   HGBA1C 6.6 (H) 12/17/2017   ESRSEDRATE 9 04/14/2013   CRP 2.4 04/14/2013   REPTSTATUS 01/06/2019 FINAL 01/01/2019   CULT  01/01/2019    NO GROWTH 5 DAYS Performed at [facility], [address] [place], [place] [address]      Lab Results  Component Value Date   ALBUMIN 3.5 11/11/2017    Lab Results  Component Value Date   MG 1.6 (L) 11/12/2017   No results found for: VD25OH  No results found for: PREALBUMIN CBC EXTENDED Latest Ref Rng & Units 01/02/2019 01/01/2019 01/01/2019  WBC 4.0 - 10.5 K/uL 8.1 8.2 7.8  RBC 4.22 - 5.81 MIL/uL 4.05(L) 4.23 4.46  HGB 13.0 - 17.0 g/dL 12.9(L) 13.4 14.5  HCT 39.0 - 52.0 % 40.4 41.6 44.8  PLT 150 - 400 K/uL 231 234 250  NEUTROABS 1.7 - 7.7 K/uL - 5.6 6.1  LYMPHSABS 0.7 - 4.0 K/uL - 1.3 1.0     There is no height or weight on file to calculate BMI.  Orders:  No orders of the defined types were placed in this encounter.  No orders of the defined  types were placed in this encounter.    Procedures: No procedures performed  Clinical Data: No additional findings.  ROS:  All other systems negative, except as noted in the HPI. Review of Systems  Objective: Vital Signs: There were no vitals taken for this visit.  Specialty Comments:  No specialty comments available.  PMFS History: Patient Active Problem List   Diagnosis Date Noted   Ulcer of right foot, with necrosis of bone ([place])    Cellulitis of right lower extremity 01/01/2019   Diabetic neurogenic arthropathy ([place]) 03/14/2018   Ulcer of toe of left foot, limited to breakdown of skin ([place]) 03/14/2018   ADD (attention deficit disorder) 11/13/2017   Anxiety 11/13/2017   HTN (hypertension) 11/13/2017   Pre-diabetes 11/13/2017   Osteomyelitis of fifth toe of right foot (HCC)    Sepsis ([place])  11/11/2017   Severe sepsis ([place]) 11/11/2017   Peripheral neuropathy 04/14/2013   Past Medical History:  Diagnosis Date   ADD (attention deficit disorder)    adult   ADD (attention deficit disorder)    Anxiety and depression    Arthritis    "probably a touch in my hands" (11/12/2017)   Diabetes mellitus without complication (HCC)    GERD (gastroesophageal reflux disease)    Hypertension    Migraine    "maybe 2 in my lifetime" (11/12/2017)   MRSA infection (methicillin-resistant Staphylococcus aureus)    Neuropathy    OSA on CPAP     not using at this time   Restless legs     Family History  Problem Relation Age of Onset   Cancer Mother    Kidney failure Father    Breast cancer Sister     Past Surgical History:  Procedure Laterality Date   AMPUTATION Left 12/17/2017   Procedure: LEFT 2ND RAY AMPUTATION;  Surgeon: Newt Minion, MD;  Location: [place];  Service: Orthopedics;  Laterality: Left;   AMPUTATION Left    foot x 2 at [place] Right 02/07/2021   Procedure: RIGHT FOOT 5TH RAY AMPUTATION;  Surgeon: Newt Minion, MD;  Location: [facility];  Service: Orthopedics;  Laterality: Right;   WISDOM TOOTH EXTRACTION     Social History   Occupational History    Comment: [employer]  Tobacco Use   Smoking status: Every Day    Packs/day: 0.40    Years: 33.00    Pack years: 13.20    Types: Cigarettes   Smokeless tobacco: Never  Vaping Use   Vaping Use: Never used  Substance and Sexual Activity   Alcohol use: Yes    Alcohol/week: 6.0 standard drinks    Types: 6 Standard drinks or equivalent per week   Drug use: No   Sexual activity: Yes

## 2021-08-14 ENCOUNTER — Ambulatory Visit (INDEPENDENT_AMBULATORY_CARE_PROVIDER_SITE_OTHER): Payer: 59 | Admitting: Orthopedic Surgery

## 2021-08-14 DIAGNOSIS — Z89432 Acquired absence of left foot: Secondary | ICD-10-CM | POA: Diagnosis not present

## 2021-08-14 DIAGNOSIS — L97521 Non-pressure chronic ulcer of other part of left foot limited to breakdown of skin: Secondary | ICD-10-CM | POA: Diagnosis not present

## 2021-08-14 DIAGNOSIS — L97529 Non-pressure chronic ulcer of other part of left foot with unspecified severity: Secondary | ICD-10-CM

## 2021-08-17 ENCOUNTER — Encounter: Payer: Self-pay | Admitting: Orthopedic Surgery

## 2021-08-17 NOTE — Progress Notes (Signed)
Office Visit Note   Patient: Johnny Martinez           Date of Birth: 1970/11/14           MRN: [id number] Visit Date: 08/14/2021              Requested by: Curly Rim, MD [place],   [id number] PCP: Curly Rim, MD  Chief Complaint  Patient presents with   Left Foot - Wound Check      HPI: Patient is a 50 year old gentleman who presents in follow-up for the left foot status post transmetatarsal amputation.  He is currently wearing compression stockings he has recently completed a azithromycin pack for bronchitis.  Assessment & Plan: Visit Diagnoses:  1. History of transmetatarsal amputation of left foot ([place])   2. Non-pressure chronic ulcer of other part of left foot limited to breakdown of skin ([place])     Plan: Ulcer was debrided offloading felt was provided.  Follow-Up Instructions: Return in about 3 weeks (around 09/04/2021).   Ortho Exam  Patient is alert, oriented, no adenopathy, well-dressed, normal affect, normal respiratory effort. Examination patient's transmetatarsal amputation is healed well.  He has a plantar ulcer across the forefoot.  He has good dorsiflexion of the ankle no venous ulcers.  After informed consent a 10 blade knife was used to debride the skin and soft tissue back to healthy viable granulation tissue this was touched with silver nitrate a Band-Aid was applied.  The ulcer was 1 cm diameter prior to debridement 2 cm in diameter after debridement and 3 mm deep.  There is healthy granulation tissue the base no exposed bone or tendon.  Imaging: No results found.   Labs: Lab Results  Component Value Date   HGBA1C 6.9 (H) 01/01/2019   HGBA1C 6.6 (H) 12/17/2017   ESRSEDRATE 9 04/14/2013   CRP 2.4 04/14/2013   REPTSTATUS 01/06/2019 FINAL 01/01/2019   CULT  01/01/2019    NO GROWTH 5 DAYS Performed at [facility], [address], [place],  [address]      Lab Results  Component Value Date   ALBUMIN  3.5 11/11/2017    Lab Results  Component Value Date   MG 1.6 (L) 11/12/2017   No results found for: VD25OH  No results found for: PREALBUMIN CBC EXTENDED Latest Ref Rng & Units 01/02/2019 01/01/2019 01/01/2019  WBC 4.0 - 10.5 K/uL 8.1 8.2 7.8  RBC 4.22 - 5.81 MIL/uL 4.05(L) 4.23 4.46  HGB 13.0 - 17.0 g/dL 12.9(L) 13.4 14.5  HCT 39.0 - 52.0 % 40.4 41.6 44.8  PLT 150 - 400 K/uL 231 234 250  NEUTROABS 1.7 - 7.7 K/uL - 5.6 6.1  LYMPHSABS 0.7 - 4.0 K/uL - 1.3 1.0     There is no height or weight on file to calculate BMI.  Orders:  No orders of the defined types were placed in this encounter.  No orders of the defined types were placed in this encounter.    Procedures: No procedures performed  Clinical Data: No additional findings.  ROS:  All other systems negative, except as noted in the HPI. Review of Systems  Objective: Vital Signs: There were no vitals taken for this visit.  Specialty Comments:  No specialty comments available.  PMFS History: Patient Active Problem List   Diagnosis Date Noted   Ulcer of right foot, with necrosis of bone ([place])    Cellulitis of right lower extremity 01/01/2019   Diabetic  neurogenic arthropathy ([place]) 03/14/2018   Ulcer of toe of left foot, limited to breakdown of skin ([place]) 03/14/2018   ADD (attention deficit disorder) 11/13/2017   Anxiety 11/13/2017   HTN (hypertension) 11/13/2017   Pre-diabetes 11/13/2017   Osteomyelitis of fifth toe of right foot ([place])    Sepsis ([place]) 11/11/2017   Severe sepsis ([place]) 11/11/2017   Peripheral neuropathy 04/14/2013   Past Medical History:  Diagnosis Date   ADD (attention deficit disorder)    adult   ADD (attention deficit disorder)    Anxiety and depression    Arthritis    "probably a touch in my hands" (11/12/2017)   Diabetes mellitus without complication (HCC)    GERD (gastroesophageal reflux disease)    Hypertension    Migraine    "maybe 2 in my lifetime" (11/12/2017)   MRSA infection  (methicillin-resistant Staphylococcus aureus)    Neuropathy    OSA on CPAP     not using at this time   Restless legs     Family History  Problem Relation Age of Onset   Cancer Mother    Kidney failure Father    Breast cancer Sister     Past Surgical History:  Procedure Laterality Date   AMPUTATION Left 12/17/2017   Procedure: LEFT 2ND RAY AMPUTATION;  Surgeon: Newt Minion, MD;  Location: [facility];  Service: Orthopedics;  Laterality: Left;   AMPUTATION Left    foot x 2 at [place] Right 02/07/2021   Procedure: RIGHT FOOT 5TH RAY AMPUTATION;  Surgeon: Newt Minion, MD;  Location: [place] [facility];  Service: Orthopedics;  Laterality: Right;   WISDOM TOOTH EXTRACTION     Social History   Occupational History    Comment: [employer]  Tobacco Use   Smoking status: Every Day    Packs/day: 0.40    Years: 33.00    Pack years: 13.20    Types: Cigarettes   Smokeless tobacco: Never  Vaping Use   Vaping Use: Never used  Substance and Sexual Activity   Alcohol use: Yes    Alcohol/week: 6.0 standard drinks    Types: 6 Standard drinks or equivalent per week   Drug use: No   Sexual activity: Yes

## 2021-09-11 ENCOUNTER — Ambulatory Visit (INDEPENDENT_AMBULATORY_CARE_PROVIDER_SITE_OTHER): Payer: 59 | Admitting: Orthopedic Surgery

## 2021-09-11 DIAGNOSIS — L97521 Non-pressure chronic ulcer of other part of left foot limited to breakdown of skin: Secondary | ICD-10-CM

## 2021-09-11 DIAGNOSIS — Z89432 Acquired absence of left foot: Secondary | ICD-10-CM

## 2021-09-15 ENCOUNTER — Encounter: Payer: Self-pay | Admitting: Orthopedic Surgery

## 2021-09-15 NOTE — Progress Notes (Signed)
Office Visit Note   Patient: Johnny Martinez           Date of Birth: 1971/05/10           MRN: [id number] Visit Date: 09/11/2021              Requested by: Curly Rim, MD [place],  [place] [address] PCP: Curly Rim, MD  Chief Complaint  Patient presents with   Left Foot - Follow-up      HPI: Patient is a 50 year old gentleman who presents in follow-up left foot ulceration status post left transmetatarsal amputation patient is in compression stockings weightbearing with a Darco shoe.  Assessment & Plan: Visit Diagnoses:  1. History of transmetatarsal amputation of left foot ([place])   2. Non-pressure chronic ulcer of other part of left foot limited to breakdown of skin ([place])     Plan: A new felt relieving donut was placed in a new Darco shoe.  Follow-Up Instructions: Return in about 4 weeks (around 10/09/2021).   Ortho Exam  Patient is alert, oriented, no adenopathy, well-dressed, normal affect, normal respiratory effort. Examination patient's foot is plantigrade there is no cellulitis no odor no drainage there is ulcer over the residual limb.  After informed consent a 10 blade knife was used to debride the skin and soft tissue back to healthy viable granulation tissue.  The ulcer before debridement was 1 cm diameter after debridement 3 cm in diameter and 3 mm deep this was touched with silver nitrate a Band-Aid was applied.  Imaging: No results found. No images are attached to the encounter.  Labs: Lab Results  Component Value Date   HGBA1C 6.9 (H) 01/01/2019   HGBA1C 6.6 (H) 12/17/2017   ESRSEDRATE 9 04/14/2013   CRP 2.4 04/14/2013   REPTSTATUS 01/06/2019 FINAL 01/01/2019   CULT  01/01/2019    NO GROWTH 5 DAYS Performed at [facility], [address], [place], [place] [address]      Lab Results  Component Value Date   ALBUMIN 3.5 11/11/2017    Lab Results  Component Value Date   MG 1.6 (L) 11/12/2017   No results found  for: VD25OH  No results found for: PREALBUMIN CBC EXTENDED Latest Ref Rng & Units 01/02/2019 01/01/2019 01/01/2019  WBC 4.0 - 10.5 K/uL 8.1 8.2 7.8  RBC 4.22 - 5.81 MIL/uL 4.05(L) 4.23 4.46  HGB 13.0 - 17.0 g/dL 12.9(L) 13.4 14.5  HCT 39.0 - 52.0 % 40.4 41.6 44.8  PLT 150 - 400 K/uL 231 234 250  NEUTROABS 1.7 - 7.7 K/uL - 5.6 6.1  LYMPHSABS 0.7 - 4.0 K/uL - 1.3 1.0     There is no height or weight on file to calculate BMI.  Orders:  No orders of the defined types were placed in this encounter.  No orders of the defined types were placed in this encounter.    Procedures: No procedures performed  Clinical Data: No additional findings.  ROS:  All other systems negative, except as noted in the HPI. Review of Systems  Objective: Vital Signs: There were no vitals taken for this visit.  Specialty Comments:  No specialty comments available.  PMFS History: Patient Active Problem List   Diagnosis Date Noted   Ulcer of right foot, with necrosis of bone ([place])    Cellulitis of right lower extremity 01/01/2019   Diabetic neurogenic arthropathy ([place]) 03/14/2018   Ulcer of toe of left foot, limited to breakdown of skin (  [place]) 03/14/2018   ADD (attention deficit disorder) 11/13/2017   Anxiety 11/13/2017   HTN (hypertension) 11/13/2017   Pre-diabetes 11/13/2017   Osteomyelitis of fifth toe of right foot ([place])    Sepsis ([place]) 11/11/2017   Severe sepsis ([place]) 11/11/2017   Peripheral neuropathy 04/14/2013   Past Medical History:  Diagnosis Date   ADD (attention deficit disorder)    adult   ADD (attention deficit disorder)    Anxiety and depression    Arthritis    "probably a touch in my hands" (11/12/2017)   Diabetes mellitus without complication (HCC)    GERD (gastroesophageal reflux disease)    Hypertension    Migraine    "maybe 2 in my lifetime" (11/12/2017)   MRSA infection (methicillin-resistant Staphylococcus aureus)    Neuropathy    OSA on CPAP     not using at this  time   Restless legs     Family History  Problem Relation Age of Onset   Cancer Mother    Kidney failure Father    Breast cancer Sister     Past Surgical History:  Procedure Laterality Date   AMPUTATION Left 12/17/2017   Procedure: LEFT 2ND RAY AMPUTATION;  Surgeon: Newt Minion, MD;  Location: [place] [facility];  Service: Orthopedics;  Laterality: Left;   AMPUTATION Left    foot x 2 at [place] Right 02/07/2021   Procedure: RIGHT FOOT 5TH RAY AMPUTATION;  Surgeon: Newt Minion, MD;  Location: [facility];  Service: Orthopedics;  Laterality: Right;   WISDOM TOOTH EXTRACTION     Social History   Occupational History    Comment: [employer]  Tobacco Use   Smoking status: Every Day    Packs/day: 0.40    Years: 33.00    Pack years: 13.20    Types: Cigarettes   Smokeless tobacco: Never  Vaping Use   Vaping Use: Never used  Substance and Sexual Activity   Alcohol use: Yes    Alcohol/week: 6.0 standard drinks    Types: 6 Standard drinks or equivalent per week   Drug use: No   Sexual activity: Yes

## 2021-10-03 DIAGNOSIS — U071 COVID-19: Secondary | ICD-10-CM

## 2021-10-03 HISTORY — DX: COVID-19: U07.1

## 2021-10-09 ENCOUNTER — Ambulatory Visit (INDEPENDENT_AMBULATORY_CARE_PROVIDER_SITE_OTHER): Payer: 59 | Admitting: Orthopedic Surgery

## 2021-10-09 DIAGNOSIS — L97521 Non-pressure chronic ulcer of other part of left foot limited to breakdown of skin: Secondary | ICD-10-CM | POA: Diagnosis not present

## 2021-10-09 DIAGNOSIS — Z89432 Acquired absence of left foot: Secondary | ICD-10-CM | POA: Diagnosis not present

## 2021-10-13 ENCOUNTER — Encounter: Payer: Self-pay | Admitting: Orthopedic Surgery

## 2021-10-13 NOTE — Progress Notes (Signed)
Office Visit Note   Patient: Johnny Martinez           Date of Birth: [birth year]           MRN: [id number] Visit Date: 10/09/2021              Requested by: Curly Rim, MD [place],  [place] [address] PCP: Curly Rim, MD  Chief Complaint  Patient presents with   Left Foot - Follow-up      HPI: Patient is a 50 year old gentleman who was seen for ulceration left foot.  Patient states he is status post a left transmetatarsal amputation with general surgery in September 2021.  Patient is wearing a compression sock weightbearing in a Darco shoe.  Assessment & Plan: Visit Diagnoses:  1. History of transmetatarsal amputation of left foot ([place])     Plan: Continue with Silvadene and silver cell for wound coverage.  Continue with compression stockings.  Follow-Up Instructions: Return in about 4 weeks (around 11/06/2021).   Ortho Exam  Patient is alert, oriented, no adenopathy, well-dressed, normal affect, normal respiratory effort. Examination patient has a Wagner grade 1 ulcer over the end of the left transmetatarsal amputation midfoot.  This is a plantar ulcer.  After informed consent a 10 blade knife was used to debride the skin and soft tissue back to healthy viable granulation tissue.  Prior to debridement the ulcer is 2 cm in diameter after debridement the ulcer is 4 cm in diameter and 5 mm deep.  There were thick rolled edges around the wound.  No exposed bone or tendon.  Patient does have venous stasis swelling with dermatitis of both legs but no open ulcers.  Imaging: No results found. No images are attached to the encounter.  Labs: Lab Results  Component Value Date   HGBA1C 6.9 (H) 01/01/2019   HGBA1C 6.6 (H) 12/17/2017   ESRSEDRATE 9 04/14/2013   CRP 2.4 04/14/2013   REPTSTATUS 01/06/2019 FINAL 01/01/2019   CULT  01/01/2019    NO GROWTH 5 DAYS Performed at [facility], [address], [place], [place] [address]      Lab Results   Component Value Date   ALBUMIN 3.5 11/11/2017    Lab Results  Component Value Date   MG 1.6 (L) 11/12/2017   No results found for: VD25OH  No results found for: PREALBUMIN CBC EXTENDED Latest Ref Rng & Units 01/02/2019 01/01/2019 01/01/2019  WBC 4.0 - 10.5 K/uL 8.1 8.2 7.8  RBC 4.22 - 5.81 MIL/uL 4.05(L) 4.23 4.46  HGB 13.0 - 17.0 g/dL 12.9(L) 13.4 14.5  HCT 39.0 - 52.0 % 40.4 41.6 44.8  PLT 150 - 400 K/uL 231 234 250  NEUTROABS 1.7 - 7.7 K/uL - 5.6 6.1  LYMPHSABS 0.7 - 4.0 K/uL - 1.3 1.0     There is no height or weight on file to calculate BMI.  Orders:  No orders of the defined types were placed in this encounter.  No orders of the defined types were placed in this encounter.    Procedures: No procedures performed  Clinical Data: No additional findings.  ROS:  All other systems negative, except as noted in the HPI. Review of Systems  Objective: Vital Signs: There were no vitals taken for this visit.  Specialty Comments:  No specialty comments available.  PMFS History: Patient Active Problem List   Diagnosis Date Noted   Ulcer of right foot, with necrosis of bone ([place])  Cellulitis of right lower extremity 01/01/2019   Diabetic neurogenic arthropathy ([place]) 03/14/2018   Ulcer of toe of left foot, limited to breakdown of skin ([place]) 03/14/2018   ADD (attention deficit disorder) 11/13/2017   Anxiety 11/13/2017   HTN (hypertension) 11/13/2017   Pre-diabetes 11/13/2017   Osteomyelitis of fifth toe of right foot ([place])    Sepsis ([place]) 11/11/2017   Severe sepsis ([place]) 11/11/2017   Peripheral neuropathy 04/14/2013   Past Medical History:  Diagnosis Date   ADD (attention deficit disorder)    adult   ADD (attention deficit disorder)    Anxiety and depression    Arthritis    "probably a touch in my hands" (11/12/2017)   Diabetes mellitus without complication (HCC)    GERD (gastroesophageal reflux disease)    Hypertension    Migraine    "maybe 2 in my  lifetime" (11/12/2017)   MRSA infection (methicillin-resistant Staphylococcus aureus)    Neuropathy    OSA on CPAP     not using at this time   Restless legs     Family History  Problem Relation Age of Onset   Cancer Mother    Kidney failure Father    Breast cancer Sister     Past Surgical History:  Procedure Laterality Date   AMPUTATION Left 12/17/2017   Procedure: LEFT 2ND RAY AMPUTATION;  Surgeon: Newt Minion, MD;  Location: [facility];  Service: Orthopedics;  Laterality: Left;   AMPUTATION Left    foot x 2 at [place] Right 02/07/2021   Procedure: RIGHT FOOT 5TH RAY AMPUTATION;  Surgeon: Newt Minion, MD;  Location: [place];  Service: Orthopedics;  Laterality: Right;   WISDOM TOOTH EXTRACTION     Social History   Occupational History    Comment: [employer]  Tobacco Use   Smoking status: Every Day    Packs/day: 0.40    Years: 33.00    Pack years: 13.20    Types: Cigarettes   Smokeless tobacco: Never  Vaping Use   Vaping Use: Never used  Substance and Sexual Activity   Alcohol use: Yes    Alcohol/week: 6.0 standard drinks    Types: 6 Standard drinks or equivalent per week   Drug use: No   Sexual activity: Yes

## 2021-11-06 ENCOUNTER — Other Ambulatory Visit: Payer: Self-pay

## 2021-11-06 ENCOUNTER — Ambulatory Visit (INDEPENDENT_AMBULATORY_CARE_PROVIDER_SITE_OTHER): Payer: 59 | Admitting: Orthopedic Surgery

## 2021-11-06 DIAGNOSIS — L97521 Non-pressure chronic ulcer of other part of left foot limited to breakdown of skin: Secondary | ICD-10-CM

## 2021-11-06 DIAGNOSIS — Z89432 Acquired absence of left foot: Secondary | ICD-10-CM

## 2021-11-07 ENCOUNTER — Encounter: Payer: Self-pay | Admitting: Orthopedic Surgery

## 2021-11-07 NOTE — Progress Notes (Signed)
Office Visit Note   Patient: Johnny Martinez           Date of Birth: 12/18/1970           MRN: [id number] Visit Date: 11/06/2021              Requested by: Curly Rim, MD [place],  [place] [address] PCP: Curly Rim, MD  Chief Complaint  Patient presents with   Left Foot - Follow-up    Left foot ulcer hx transmet amputation       HPI: Patient is a 51 year old gentleman who is status post left transmetatarsal amputation.  Patient has been using a compression sock alternating's Silvadene and silver dressing.  Patient is full weightbearing in a regular shoe.  Assessment & Plan: Visit Diagnoses:  1. History of transmetatarsal amputation of left foot ([place])   2. Non-pressure chronic ulcer of other part of left foot limited to breakdown of skin ([place])     Plan: Ulcer was debrided of skin and soft tissue it does not probe to bone no signs of deep bone infection.  Recommended minimizing weightbearing dressing changes daily.  Follow-Up Instructions: Return in about 4 weeks (around 12/04/2021).   Ortho Exam  Patient is alert, oriented, no adenopathy, well-dressed, normal affect, normal respiratory effort. Examination patient has increased nonviable tissue over the end of the transmetatarsal amputation.  Ulcer shows increased depth with clear drainage.  The ulcer does not probe to bone.  After informed consent a 10 blade knife was used to debride the skin and soft tissue back to healthy viable tissue.  After debridement the wound was 3 x 4 cm and 5 mm deep.  There is no exposed bone or tendon.  Imaging: No results found. No images are attached to the encounter.  Labs: Lab Results  Component Value Date   HGBA1C 6.9 (H) 01/01/2019   HGBA1C 6.6 (H) 12/17/2017   ESRSEDRATE 9 04/14/2013   CRP 2.4 04/14/2013   REPTSTATUS 01/06/2019 FINAL 01/01/2019   CULT  01/01/2019    NO GROWTH 5 DAYS Performed at [facility], [address] [place], [place]  [address]      Lab Results  Component Value Date   ALBUMIN 3.5 11/11/2017    Lab Results  Component Value Date   MG 1.6 (L) 11/12/2017   No results found for: VD25OH  No results found for: PREALBUMIN CBC EXTENDED Latest Ref Rng & Units 01/02/2019 01/01/2019 01/01/2019  WBC 4.0 - 10.5 K/uL 8.1 8.2 7.8  RBC 4.22 - 5.81 MIL/uL 4.05(L) 4.23 4.46  HGB 13.0 - 17.0 g/dL 12.9(L) 13.4 14.5  HCT 39.0 - 52.0 % 40.4 41.6 44.8  PLT 150 - 400 K/uL 231 234 250  NEUTROABS 1.7 - 7.7 K/uL - 5.6 6.1  LYMPHSABS 0.7 - 4.0 K/uL - 1.3 1.0     There is no height or weight on file to calculate BMI.  Orders:  No orders of the defined types were placed in this encounter.  No orders of the defined types were placed in this encounter.    Procedures: No procedures performed  Clinical Data: No additional findings.  ROS:  All other systems negative, except as noted in the HPI. Review of Systems  Objective: Vital Signs: There were no vitals taken for this visit.  Specialty Comments:  No specialty comments available.  PMFS History: Patient Active Problem List   Diagnosis Date Noted   Ulcer of right foot, with  necrosis of bone ([place])    Cellulitis of right lower extremity 01/01/2019   Diabetic neurogenic arthropathy ([place]) 03/14/2018   Ulcer of toe of left foot, limited to breakdown of skin ([place]) 03/14/2018   ADD (attention deficit disorder) 11/13/2017   Anxiety 11/13/2017   HTN (hypertension) 11/13/2017   Pre-diabetes 11/13/2017   Osteomyelitis of fifth toe of right foot ([place])    Sepsis ([place]) 11/11/2017   Severe sepsis ([place]) 11/11/2017   Peripheral neuropathy 04/14/2013   Past Medical History:  Diagnosis Date   ADD (attention deficit disorder)    adult   ADD (attention deficit disorder)    Anxiety and depression    Arthritis    "probably a touch in my hands" (11/12/2017)   Diabetes mellitus without complication (HCC)    GERD (gastroesophageal reflux disease)    Hypertension     Migraine    "maybe 2 in my lifetime" (11/12/2017)   MRSA infection (methicillin-resistant Staphylococcus aureus)    Neuropathy    OSA on CPAP     not using at this time   Restless legs     Family History  Problem Relation Age of Onset   Cancer Mother    Kidney failure Father    Breast cancer Sister     Past Surgical History:  Procedure Laterality Date   AMPUTATION Left 12/17/2017   Procedure: LEFT 2ND RAY AMPUTATION;  Surgeon: Newt Minion, MD;  Location: [facility];  Service: Orthopedics;  Laterality: Left;   AMPUTATION Left    foot x 2 at [place] Right 02/07/2021   Procedure: RIGHT FOOT 5TH RAY AMPUTATION;  Surgeon: Newt Minion, MD;  Location: [facility];  Service: Orthopedics;  Laterality: Right;   WISDOM TOOTH EXTRACTION     Social History   Occupational History    Comment: [employer]  Tobacco Use   Smoking status: Every Day    Packs/day: 0.40    Years: 33.00    Pack years: 13.20    Types: Cigarettes   Smokeless tobacco: Never  Vaping Use   Vaping Use: Never used  Substance and Sexual Activity   Alcohol use: Yes    Alcohol/week: 6.0 standard drinks    Types: 6 Standard drinks or equivalent per week   Drug use: No   Sexual activity: Yes

## 2021-12-11 ENCOUNTER — Other Ambulatory Visit: Payer: Self-pay

## 2021-12-11 ENCOUNTER — Ambulatory Visit (INDEPENDENT_AMBULATORY_CARE_PROVIDER_SITE_OTHER): Payer: BC Managed Care – PPO | Admitting: Orthopedic Surgery

## 2021-12-11 DIAGNOSIS — Z89432 Acquired absence of left foot: Secondary | ICD-10-CM

## 2021-12-11 DIAGNOSIS — L97521 Non-pressure chronic ulcer of other part of left foot limited to breakdown of skin: Secondary | ICD-10-CM | POA: Diagnosis not present

## 2021-12-13 ENCOUNTER — Encounter: Payer: Self-pay | Admitting: Orthopedic Surgery

## 2021-12-13 NOTE — Progress Notes (Signed)
Office Visit Note   Patient: Johnny Martinez           Date of Birth: 10/11/1971           MRN: [id number] Visit Date: 12/11/2021              Requested by: Curly Rim, MD [place],  [place] [address] PCP: Curly Rim, MD  Chief Complaint  Patient presents with   Left Foot - Follow-up    Hx transmet amputation and ulcer       HPI: Patient is a 51 year old gentleman who is status post a left transmetatarsal amputation a year and a half ago.  Patient presents with persistent ulceration plantar midfoot.  Assessment & Plan: Visit Diagnoses:  1. History of transmetatarsal amputation of left foot ([place])     Plan: Ulcer was debrided this did not extend down to bone.  Continue with protective shoe wear and pressure relieving.  Follow-Up Instructions: Return in about 2 weeks (around 12/25/2021).   Ortho Exam  Patient is alert, oriented, no adenopathy, well-dressed, normal affect, normal respiratory effort. Examination patient has large callused skin and ulceration of the plantar aspect of his foot he complains of odor and drainage.  He is currently full weightbearing in regular shoes.  After informed consent a 10 blade knife was used to debride the skin and soft tissue back to bleeding viable granulation tissue.  Silver nitrate was used for hemostasis.  There is no exposed bone there is no depth or undermining of the wound.  The wound was 2 cm in diameter prior to debridement 4 cm in diameter after debridement 4 x 4 dressing was applied.  There is no tenderness to palpation around the wound no cellulitis no signs of infection.  Imaging: No results found. No images are attached to the encounter.  Labs: Lab Results  Component Value Date   HGBA1C 6.9 (H) 01/01/2019   HGBA1C 6.6 (H) 12/17/2017   ESRSEDRATE 9 04/14/2013   CRP 2.4 04/14/2013   REPTSTATUS 01/06/2019 FINAL 01/01/2019   CULT  01/01/2019    NO GROWTH 5 DAYS Performed at [facility], [address] [place], [place] [address]      Lab Results  Component Value Date   ALBUMIN 3.5 11/11/2017    Lab Results  Component Value Date   MG 1.6 (L) 11/12/2017   No results found for: VD25OH  No results found for: PREALBUMIN CBC EXTENDED Latest Ref Rng & Units 01/02/2019 01/01/2019 01/01/2019  WBC 4.0 - 10.5 K/uL 8.1 8.2 7.8  RBC 4.22 - 5.81 MIL/uL 4.05(L) 4.23 4.46  HGB 13.0 - 17.0 g/dL 12.9(L) 13.4 14.5  HCT 39.0 - 52.0 % 40.4 41.6 44.8  PLT 150 - 400 K/uL 231 234 250  NEUTROABS 1.7 - 7.7 K/uL - 5.6 6.1  LYMPHSABS 0.7 - 4.0 K/uL - 1.3 1.0     There is no height or weight on file to calculate BMI.  Orders:  No orders of the defined types were placed in this encounter.  No orders of the defined types were placed in this encounter.    Procedures: No procedures performed  Clinical Data: No additional findings.  ROS:  All other systems negative, except as noted in the HPI. Review of Systems  Objective: Vital Signs: There were no vitals taken for this visit.  Specialty Comments:  No specialty comments available.  PMFS History: Patient Active Problem List   Diagnosis Date Noted  Ulcer of right foot, with necrosis of bone ([place])    Cellulitis of right lower extremity 01/01/2019   Diabetic neurogenic arthropathy ([place]) 03/14/2018   Ulcer of toe of left foot, limited to breakdown of skin ([place]) 03/14/2018   ADD (attention deficit disorder) 11/13/2017   Anxiety 11/13/2017   HTN (hypertension) 11/13/2017   Pre-diabetes 11/13/2017   Osteomyelitis of fifth toe of right foot ([place])    Sepsis ([place]) 11/11/2017   Severe sepsis ([place]) 11/11/2017   Peripheral neuropathy 04/14/2013   Past Medical History:  Diagnosis Date   ADD (attention deficit disorder)    adult   ADD (attention deficit disorder)    Anxiety and depression    Arthritis    "probably a touch in my hands" (11/12/2017)   Diabetes mellitus without complication (HCC)    GERD (gastroesophageal  reflux disease)    Hypertension    Migraine    "maybe 2 in my lifetime" (11/12/2017)   MRSA infection (methicillin-resistant Staphylococcus aureus)    Neuropathy    OSA on CPAP     not using at this time   Restless legs     Family History  Problem Relation Age of Onset   Cancer Mother    Kidney failure Father    Breast cancer Sister     Past Surgical History:  Procedure Laterality Date   AMPUTATION Left 12/17/2017   Procedure: LEFT 2ND RAY AMPUTATION;  Surgeon: Newt Minion, MD;  Location: [facility];  Service: Orthopedics;  Laterality: Left;   AMPUTATION Left    foot x 2 at [place] Right 02/07/2021   Procedure: RIGHT FOOT 5TH RAY AMPUTATION;  Surgeon: Newt Minion, MD;  Location: [facility];  Service: Orthopedics;  Laterality: Right;   WISDOM TOOTH EXTRACTION     Social History   Occupational History    Comment: [employer]  Tobacco Use   Smoking status: Every Day    Packs/day: 0.40    Years: 33.00    Pack years: 13.20    Types: Cigarettes   Smokeless tobacco: Never  Vaping Use   Vaping Use: Never used  Substance and Sexual Activity   Alcohol use: Yes    Alcohol/week: 6.0 standard drinks    Types: 6 Standard drinks or equivalent per week   Drug use: No   Sexual activity: Yes

## 2022-01-01 ENCOUNTER — Encounter: Payer: Self-pay | Admitting: Orthopedic Surgery

## 2022-01-01 ENCOUNTER — Ambulatory Visit (INDEPENDENT_AMBULATORY_CARE_PROVIDER_SITE_OTHER): Payer: BC Managed Care – PPO | Admitting: Orthopedic Surgery

## 2022-01-01 DIAGNOSIS — Z89432 Acquired absence of left foot: Secondary | ICD-10-CM

## 2022-01-01 DIAGNOSIS — L97521 Non-pressure chronic ulcer of other part of left foot limited to breakdown of skin: Secondary | ICD-10-CM | POA: Diagnosis not present

## 2022-01-01 MED ORDER — DOXYCYCLINE HYCLATE 100 MG PO TABS: 100.0000 mg | ORAL_TABLET | Freq: Two times a day (BID) | ORAL | 0 refills | Status: DC

## 2022-01-01 NOTE — Progress Notes (Signed)
? ?Office Visit Note ?  ?Patient: Johnny Martinez           ?Date of Birth: 21-Aug-1971           ?MRN: [id number] ?Visit Date: 01/01/2022 ?             ?Requested by: Curly Rim, MD ?[address] ?[place],  [place] [address] ?PCP: Corrington, Delsa Grana, MD ? ?Chief Complaint  ?Patient presents with  ? Left Foot - Wound Check  ?  Hx left TMA and ulcer check  ? ? ? ? ?HPI: ?Patient is a 51 year old gentleman status post left transmetatarsal amputation with a Wagner grade 1 ulcer. ? ?Assessment & Plan: ?Visit Diagnoses:  ?1. History of transmetatarsal amputation of left foot ([place])   ?2. Non-pressure chronic ulcer of other part of left foot limited to breakdown of skin ([place])   ? ? ?Plan: Ulcer was debrided with the new depth to the middle of the ulcer we will start him on doxycycline 100 mg twice a day. ? ?Follow-Up Instructions: Return in about 3 weeks (around 01/22/2022).  ? ?Ortho Exam ? ?Patient is alert, oriented, no adenopathy, well-dressed, normal affect, normal respiratory effort. ?Examination patient does have venous stasis swelling he is wearing knee-high compression socks.  The circumference of the plantar wound is smaller the callus skin is smaller however there is a small indentation in the middle of the wound there is good granulation tissue.  The wound is 2 cm in diameter prior to debridement.  After informed consent a 10 blade knife was used to debride the skin and soft tissue back to healthy viable bleeding granulation tissue silver nitrate was used for hemostasis.  The ulcer was 3 cm in diameter after debridement the center of the wound probes about a half a centimeter deep but does not probe to bone or tendon. ? ?Imaging: ?No results found. ?No images are attached to the encounter. ? ?Labs: ?Lab Results  ?Component Value Date  ? HGBA1C 6.9 (H) 01/01/2019  ? HGBA1C 6.6 (H) 12/17/2017  ? ESRSEDRATE 9 04/14/2013  ? CRP 2.4 04/14/2013  ? REPTSTATUS 01/06/2019 FINAL 01/01/2019  ? CULT  01/01/2019  ?   NO GROWTH 5 DAYS ?Performed at [facility], [address] [place], [place] [address] ?  ? ? ? ?Lab Results  ?Component Value Date  ? ALBUMIN 3.5 11/11/2017  ? ? ?Lab Results  ?Component Value Date  ? MG 1.6 (L) 11/12/2017  ? ?No results found for: VD25OH ? ?No results found for: PREALBUMIN ?CBC EXTENDED Latest Ref Rng & Units 01/02/2019 01/01/2019 01/01/2019  ?WBC 4.0 - 10.5 K/uL 8.1 8.2 7.8  ?RBC 4.22 - 5.81 MIL/uL 4.05(L) 4.23 4.46  ?HGB 13.0 - 17.0 g/dL 12.9(L) 13.4 14.5  ?HCT 39.0 - 52.0 % 40.4 41.6 44.8  ?PLT 150 - 400 K/uL 231 234 250  ?NEUTROABS 1.7 - 7.7 K/uL - 5.6 6.1  ?LYMPHSABS 0.7 - 4.0 K/uL - 1.3 1.0  ? ? ? ?There is no height or weight on file to calculate BMI. ? ?Orders:  ?No orders of the defined types were placed in this encounter. ? ?Meds ordered this encounter  ?Medications  ? doxycycline (VIBRA-TABS) 100 MG tablet  ?  Sig: Take 1 tablet (100 mg total) by mouth 2 (two) times daily.  ?  Dispense:  60 tablet  ?  Refill:  0  ? ? ? Procedures: ?No procedures performed ? ?Clinical Data: ?No additional findings. ? ?ROS: ? ?  All other systems negative, except as noted in the HPI. ?Review of Systems ? ?Objective: ?Vital Signs: There were no vitals taken for this visit. ? ?Specialty Comments:  ?No specialty comments available. ? ?PMFS History: ?Patient Active Problem List  ? Diagnosis Date Noted  ? Ulcer of right foot, with necrosis of bone ([place])   ? Cellulitis of right lower extremity 01/01/2019  ? Diabetic neurogenic arthropathy ([place]) 03/14/2018  ? Ulcer of toe of left foot, limited to breakdown of skin ([place]) 03/14/2018  ? ADD (attention deficit disorder) 11/13/2017  ? Anxiety 11/13/2017  ? HTN (hypertension) 11/13/2017  ? Pre-diabetes 11/13/2017  ? Osteomyelitis of fifth toe of right foot ([place])   ? Sepsis ([place]) 11/11/2017  ? Severe sepsis ([place]) 11/11/2017  ? Peripheral neuropathy 04/14/2013  ? ?Past Medical History:  ?Diagnosis Date  ? ADD (attention deficit disorder)   ? adult  ? ADD (attention deficit  disorder)   ? Anxiety and depression   ? Arthritis   ? "probably a touch in my hands" (11/12/2017)  ? Diabetes mellitus without complication ([place])   ? GERD (gastroesophageal reflux disease)   ? Hypertension   ? Migraine   ? "maybe 2 in my lifetime" (11/12/2017)  ? MRSA infection (methicillin-resistant Staphylococcus aureus)   ? Neuropathy   ? OSA on CPAP   ?  not using at this time  ? Restless legs   ?  ?Family History  ?Problem Relation Age of Onset  ? Cancer Mother   ? Kidney failure Father   ? Breast cancer Sister   ?  ?Past Surgical History:  ?Procedure Laterality Date  ? AMPUTATION Left 12/17/2017  ? Procedure: LEFT 2ND RAY AMPUTATION;  Surgeon: Newt Minion, MD;  Location: [facility];  Service: Orthopedics;  Laterality: Left;  ? AMPUTATION Left   ? foot x 2 at [facility]  ? AMPUTATION Right 02/07/2021  ? Procedure: RIGHT FOOT 5TH RAY AMPUTATION;  Surgeon: Newt Minion, MD;  Location: [facility];  Service: Orthopedics;  Laterality: Right;  ? WISDOM TOOTH EXTRACTION    ? ?Social History  ? ?Occupational History  ?  Comment: [employer]  ?Tobacco Use  ? Smoking status: Every Day  ?  Packs/day: 0.40  ?  Years: 33.00  ?  Pack years: 13.20  ?  Types: Cigarettes  ? Smokeless tobacco: Never  ?Vaping Use  ? Vaping Use: Never used  ?Substance and Sexual Activity  ? Alcohol use: Yes  ?  Alcohol/week: 6.0 standard drinks  ?  Types: 6 Standard drinks or equivalent per week  ? Drug use: No  ? Sexual activity: Yes  ? ? ? ? ? ?

## 2022-01-22 ENCOUNTER — Ambulatory Visit (INDEPENDENT_AMBULATORY_CARE_PROVIDER_SITE_OTHER): Payer: BC Managed Care – PPO | Admitting: Orthopedic Surgery

## 2022-01-22 DIAGNOSIS — L97521 Non-pressure chronic ulcer of other part of left foot limited to breakdown of skin: Secondary | ICD-10-CM | POA: Diagnosis not present

## 2022-01-22 DIAGNOSIS — Z89432 Acquired absence of left foot: Secondary | ICD-10-CM | POA: Diagnosis not present

## 2022-01-23 ENCOUNTER — Encounter: Payer: Self-pay | Admitting: Orthopedic Surgery

## 2022-01-23 NOTE — Progress Notes (Signed)
? ?Office Visit Note ?  ?Patient: Johnny Martinez           ?Date of Birth: 1970-12-22           ?MRN: [id number] ?Visit Date: 01/22/2022 ?             ?Requested by: Curly Rim, MD ?[place] ?[place],  [place] [address] ?PCP: Corrington, Delsa Grana, MD ? ?Chief Complaint  ?Patient presents with  ? Left Foot - Follow-up  ?  Hx TMA  ? ? ? ? ?HPI: ?Patient is a 51 year old gentleman who presents in follow-up for plantar ulcer left foot he is status post transmetatarsal amputation. ? ?Assessment & Plan: ?Visit Diagnoses:  ?1. History of transmetatarsal amputation of left foot ([place])   ?2. Non-pressure chronic ulcer of other part of left foot limited to breakdown of skin ([place])   ? ? ?Plan: Continue with the pressure relieving felt pad.  Complete the doxycycline. ? ?Discussed the possibility of application of Kerecis skin graft in the office. ? ?Follow-Up Instructions: Return in about 3 weeks (around 02/12/2022).  ? ?Ortho Exam ? ?Patient is alert, oriented, no adenopathy, well-dressed, normal affect, normal respiratory effort. ?Examination there is no redness or cellulitis.  There is no drainage or odor.  Patient does have a plantar ulcer that is 1 cm diameter.  After informed consent a 10 blade knife was used to debride the skin and soft tissue back to healthy viable granulation tissue the wound does not probe to bone it is 2 cm in diameter and 2 mm deep after debridement. ? ?Imaging: ?No results found. ?No images are attached to the encounter. ? ?Labs: ?Lab Results  ?Component Value Date  ? HGBA1C 6.9 (H) 01/01/2019  ? HGBA1C 6.6 (H) 12/17/2017  ? ESRSEDRATE 9 04/14/2013  ? CRP 2.4 04/14/2013  ? REPTSTATUS 01/06/2019 FINAL 01/01/2019  ? CULT  01/01/2019  ?  NO GROWTH 5 DAYS ?Performed at [facility], [address] [place], [place] [address] ?  ? ? ? ?Lab Results  ?Component Value Date  ? ALBUMIN 3.5 11/11/2017  ? ? ?Lab Results  ?Component Value Date  ? MG 1.6 (L) 11/12/2017  ? ?No results found for:  VD25OH ? ?No results found for: PREALBUMIN ? ?  Latest Ref Rng & Units 01/02/2019  ?  5:10 AM 01/01/2019  ?  7:02 PM 01/01/2019  ?  3:18 PM  ?CBC EXTENDED  ?WBC 4.0 - 10.5 K/uL 8.1   8.2   7.8    ?RBC 4.22 - 5.81 MIL/uL 4.05   4.23   4.46    ?Hemoglobin 13.0 - 17.0 g/dL 12.9   13.4   14.5    ?HCT 39.0 - 52.0 % 40.4   41.6   44.8    ?Platelets 150 - 400 K/uL 231   234   250    ?NEUT# 1.7 - 7.7 K/uL  5.6   6.1    ?Lymph# 0.7 - 4.0 K/uL  1.3   1.0    ? ? ? ?There is no height or weight on file to calculate BMI. ? ?Orders:  ?No orders of the defined types were placed in this encounter. ? ?No orders of the defined types were placed in this encounter. ? ? ? Procedures: ?No procedures performed ? ?Clinical Data: ?No additional findings. ? ?ROS: ? ?All other systems negative, except as noted in the HPI. ?Review of Systems ? ?Objective: ?Vital Signs: There were no vitals taken  for this visit. ? ?Specialty Comments:  ?No specialty comments available. ? ?PMFS History: ?Patient Active Problem List  ? Diagnosis Date Noted  ? Ulcer of right foot, with necrosis of bone ([place])   ? Cellulitis of right lower extremity 01/01/2019  ? Diabetic neurogenic arthropathy ([place]) 03/14/2018  ? Ulcer of toe of left foot, limited to breakdown of skin ([place]) 03/14/2018  ? ADD (attention deficit disorder) 11/13/2017  ? Anxiety 11/13/2017  ? HTN (hypertension) 11/13/2017  ? Pre-diabetes 11/13/2017  ? Osteomyelitis of fifth toe of right foot ([place])   ? Sepsis ([place]) 11/11/2017  ? Severe sepsis ([place]) 11/11/2017  ? Peripheral neuropathy 04/14/2013  ? ?Past Medical History:  ?Diagnosis Date  ? ADD (attention deficit disorder)   ? adult  ? ADD (attention deficit disorder)   ? Anxiety and depression   ? Arthritis   ? "probably a touch in my hands" (11/12/2017)  ? Diabetes mellitus without complication ([place])   ? GERD (gastroesophageal reflux disease)   ? Hypertension   ? Migraine   ? "maybe 2 in my lifetime" (11/12/2017)  ? MRSA infection (methicillin-resistant  Staphylococcus aureus)   ? Neuropathy   ? OSA on CPAP   ?  not using at this time  ? Restless legs   ?  ?Family History  ?Problem Relation Age of Onset  ? Cancer Mother   ? Kidney failure Father   ? Breast cancer Sister   ?  ?Past Surgical History:  ?Procedure Laterality Date  ? AMPUTATION Left 12/17/2017  ? Procedure: LEFT 2ND RAY AMPUTATION;  Surgeon: Newt Minion, MD;  Location: [facility];  Service: Orthopedics;  Laterality: Left;  ? AMPUTATION Left   ? foot x 2 at [facility]  ? AMPUTATION Right 02/07/2021  ? Procedure: RIGHT FOOT 5TH RAY AMPUTATION;  Surgeon: Newt Minion, MD;  Location: [facility];  Service: Orthopedics;  Laterality: Right;  ? WISDOM TOOTH EXTRACTION    ? ?Social History  ? ?Occupational History  ?  Comment: [employer]  ?Tobacco Use  ? Smoking status: Every Day  ?  Packs/day: 0.40  ?  Years: 33.00  ?  Pack years: 13.20  ?  Types: Cigarettes  ? Smokeless tobacco: Never  ?Vaping Use  ? Vaping Use: Never used  ?Substance and Sexual Activity  ? Alcohol use: Yes  ?  Alcohol/week: 6.0 standard drinks  ?  Types: 6 Standard drinks or equivalent per week  ? Drug use: No  ? Sexual activity: Yes  ? ? ? ? ? ?

## 2022-01-29 ENCOUNTER — Other Ambulatory Visit: Payer: Self-pay | Admitting: Orthopedic Surgery

## 2022-02-12 ENCOUNTER — Ambulatory Visit (INDEPENDENT_AMBULATORY_CARE_PROVIDER_SITE_OTHER): Payer: BC Managed Care – PPO | Admitting: Orthopedic Surgery

## 2022-02-12 DIAGNOSIS — Z89432 Acquired absence of left foot: Secondary | ICD-10-CM

## 2022-02-12 DIAGNOSIS — L97521 Non-pressure chronic ulcer of other part of left foot limited to breakdown of skin: Secondary | ICD-10-CM

## 2022-02-22 ENCOUNTER — Encounter: Payer: Self-pay | Admitting: Orthopedic Surgery

## 2022-02-22 NOTE — Progress Notes (Signed)
? ?Office Visit Note ?  ?Patient: Johnny Martinez           ?Date of Birth: 1971/04/23           ?MRN: [id number] ?Visit Date: 02/12/2022 ?             ?Requested by: Curly Rim, MD ?[address] ?[place],  [place] [id number] ?PCP: Corrington, Delsa Grana, MD ? ?Chief Complaint  ?Patient presents with  ? Left Foot - Wound Check  ?  Hx TMA  ? ? ? ? ?HPI: ?Patient is a 51 year old gentleman who is status post a left transmetatarsal amputation.  He has completed his course of doxycycline he has been using a felt relieving pad. ? ?Assessment & Plan: ?Visit Diagnoses:  ?1. History of transmetatarsal amputation of left foot ([place])   ?2. Non-pressure chronic ulcer of other part of left foot limited to breakdown of skin ([place])   ? ? ?Plan: Discussed possibly revising the transmetatarsal amputation.  Discussed the importance of elevation and wearing compression. ? ?Follow-Up Instructions: Return in about 4 weeks (around 03/12/2022).  ? ?Ortho Exam ? ?Patient is alert, oriented, no adenopathy, well-dressed, normal affect, normal respiratory effort. ?Examination there is a 4 cm ulcer plantar aspect left transmetatarsal amputation.  After informed consent a 10 blade knife was used to debride the skin and soft tissue back to healthy viable granulation tissue there is no exposed bone there is healthy granulation tissue at the base this was touched with silver nitrate.  The ulcer was 4 cm in diameter and 2 mm deep after debridement.  There is increased swelling in the foot. ? ?Imaging: ?No results found. ?No images are attached to the encounter. ? ?Labs: ?Lab Results  ?Component Value Date  ? HGBA1C 6.9 (H) 01/01/2019  ? HGBA1C 6.6 (H) 12/17/2017  ? ESRSEDRATE 9 04/14/2013  ? CRP 2.4 04/14/2013  ? REPTSTATUS 01/06/2019 FINAL 01/01/2019  ? CULT  01/01/2019  ?  NO GROWTH 5 DAYS ?Performed at [facility], [address] [place], [place] [address] ?  ? ? ? ?Lab Results  ?Component Value Date  ? ALBUMIN 3.5 11/11/2017  ? ? ?Lab  Results  ?Component Value Date  ? MG 1.6 (L) 11/12/2017  ? ?No results found for: VD25OH ? ?No results found for: PREALBUMIN ? ?  Latest Ref Rng & Units 01/02/2019  ?  5:10 AM 01/01/2019  ?  7:02 PM 01/01/2019  ?  3:18 PM  ?CBC EXTENDED  ?WBC 4.0 - 10.5 K/uL 8.1   8.2   7.8    ?RBC 4.22 - 5.81 MIL/uL 4.05   4.23   4.46    ?Hemoglobin 13.0 - 17.0 g/dL 12.9   13.4   14.5    ?HCT 39.0 - 52.0 % 40.4   41.6   44.8    ?Platelets 150 - 400 K/uL 231   234   250    ?NEUT# 1.7 - 7.7 K/uL  5.6   6.1    ?Lymph# 0.7 - 4.0 K/uL  1.3   1.0    ? ? ? ?There is no height or weight on file to calculate BMI. ? ?Orders:  ?No orders of the defined types were placed in this encounter. ? ?No orders of the defined types were placed in this encounter. ? ? ? Procedures: ?No procedures performed ? ?Clinical Data: ?No additional findings. ? ?ROS: ? ?All other systems negative, except as noted in the HPI. ?Review of Systems ? ?  Objective: ?Vital Signs: There were no vitals taken for this visit. ? ?Specialty Comments:  ?No specialty comments available. ? ?PMFS History: ?Patient Active Problem List  ? Diagnosis Date Noted  ? Ulcer of right foot, with necrosis of bone ([place])   ? Cellulitis of right lower extremity 01/01/2019  ? Diabetic neurogenic arthropathy ([place]) 03/14/2018  ? Ulcer of toe of left foot, limited to breakdown of skin ([place]) 03/14/2018  ? ADD (attention deficit disorder) 11/13/2017  ? Anxiety 11/13/2017  ? HTN (hypertension) 11/13/2017  ? Pre-diabetes 11/13/2017  ? Osteomyelitis of fifth toe of right foot ([place])   ? Sepsis ([place]) 11/11/2017  ? Severe sepsis ([place]) 11/11/2017  ? Peripheral neuropathy 04/14/2013  ? ?Past Medical History:  ?Diagnosis Date  ? ADD (attention deficit disorder)   ? adult  ? ADD (attention deficit disorder)   ? Anxiety and depression   ? Arthritis   ? "probably a touch in my hands" (11/12/2017)  ? Diabetes mellitus without complication ([place])   ? GERD (gastroesophageal reflux disease)   ? Hypertension   ? Migraine   ?  "maybe 2 in my lifetime" (11/12/2017)  ? MRSA infection (methicillin-resistant Staphylococcus aureus)   ? Neuropathy   ? OSA on CPAP   ?  not using at this time  ? Restless legs   ?  ?Family History  ?Problem Relation Age of Onset  ? Cancer Mother   ? Kidney failure Father   ? Breast cancer Sister   ?  ?Past Surgical History:  ?Procedure Laterality Date  ? AMPUTATION Left 12/17/2017  ? Procedure: LEFT 2ND RAY AMPUTATION;  Surgeon: Newt Minion, MD;  Location: [facility];  Service: Orthopedics;  Laterality: Left;  ? AMPUTATION Left   ? foot x 2 at [facility]  ? AMPUTATION Right 02/07/2021  ? Procedure: RIGHT FOOT 5TH RAY AMPUTATION;  Surgeon: Newt Minion, MD;  Location: [facility];  Service: Orthopedics;  Laterality: Right;  ? WISDOM TOOTH EXTRACTION    ? ?Social History  ? ?Occupational History  ?  Comment: [employer]  ?Tobacco Use  ? Smoking status: Every Day  ?  Packs/day: 0.40  ?  Years: 33.00  ?  Pack years: 13.20  ?  Types: Cigarettes  ? Smokeless tobacco: Never  ?Vaping Use  ? Vaping Use: Never used  ?Substance and Sexual Activity  ? Alcohol use: Yes  ?  Alcohol/week: 6.0 standard drinks  ?  Types: 6 Standard drinks or equivalent per week  ? Drug use: No  ? Sexual activity: Yes  ? ? ? ? ? ?

## 2022-03-05 LAB — COLOGUARD: COLOGUARD: NEGATIVE

## 2022-03-05 LAB — EXTERNAL GENERIC LAB PROCEDURE: COLOGUARD: NEGATIVE

## 2022-03-12 ENCOUNTER — Ambulatory Visit (INDEPENDENT_AMBULATORY_CARE_PROVIDER_SITE_OTHER): Payer: BC Managed Care – PPO | Admitting: Orthopedic Surgery

## 2022-03-12 DIAGNOSIS — Z89432 Acquired absence of left foot: Secondary | ICD-10-CM

## 2022-03-12 DIAGNOSIS — L97521 Non-pressure chronic ulcer of other part of left foot limited to breakdown of skin: Secondary | ICD-10-CM | POA: Diagnosis not present

## 2022-03-14 ENCOUNTER — Encounter: Payer: Self-pay | Admitting: Orthopedic Surgery

## 2022-03-14 NOTE — Progress Notes (Signed)
? ?Office Visit Note ?  ?Patient: Johnny Martinez           ?Date of Birth: Apr 01, 1971           ?MRN: [id number] ?Visit Date: 03/12/2022 ?             ?Requested by: Curly Rim, MD ?[place] [address] ?[place],  [place] [address] ?PCP: Corrington, Delsa Grana, MD ? ?Chief Complaint  ?Patient presents with  ? Left Foot - Wound Check  ?  Hx left transmet amputation  ? ? ? ? ?HPI: ?Patient is a 51 year old gentleman status post left transmetatarsal amputation with a Wagner grade 1 ulcer.  Patient has been using elevation and compression. ? ?Assessment & Plan: ?Visit Diagnoses:  ?1. History of transmetatarsal amputation of left foot ([place])   ?2. Non-pressure chronic ulcer of other part of left foot limited to breakdown of skin ([place])   ? ? ?Plan: Ulcer was debrided on the left nails trimmed x4 on the right.  Continue with pressure offloading. ? ?Follow-Up Instructions: Return in about 4 weeks (around 04/09/2022).  ? ?Ortho Exam ? ?Patient is alert, oriented, no adenopathy, well-dressed, normal affect, normal respiratory effort. ?Examination of the right foot patient has thickened discolored onychomycotic nails x4 these were trimmed without complications.  On the left foot patient has a Wagner grade 1 ulcer that does not probe to bone.  There is no odor no drainage there is good granulation tissue.  After informed consent a 10 blade knife was used to debride the skin and soft tissue back to healthy viable tissue.  The ulcer was 2 cm in diameter prior to debridement 4 cm in diameter after debridement.  Silver nitrate was used for hemostasis.  There is no undermining. ? ?Imaging: ?No results found. ?No images are attached to the encounter. ? ?Labs: ?Lab Results  ?Component Value Date  ? HGBA1C 6.9 (H) 01/01/2019  ? HGBA1C 6.6 (H) 12/17/2017  ? ESRSEDRATE 9 04/14/2013  ? CRP 2.4 04/14/2013  ? REPTSTATUS 01/06/2019 FINAL 01/01/2019  ? CULT  01/01/2019  ?  NO GROWTH 5 DAYS ?Performed at [facility], [address]  [place], [place] [address] ?  ? ? ? ?Lab Results  ?Component Value Date  ? ALBUMIN 3.5 11/11/2017  ? ? ?Lab Results  ?Component Value Date  ? MG 1.6 (L) 11/12/2017  ? ?No results found for: VD25OH ? ?No results found for: PREALBUMIN ? ?  Latest Ref Rng & Units 01/02/2019  ?  5:10 AM 01/01/2019  ?  7:02 PM 01/01/2019  ?  3:18 PM  ?CBC EXTENDED  ?WBC 4.0 - 10.5 K/uL 8.1   8.2   7.8    ?RBC 4.22 - 5.81 MIL/uL 4.05   4.23   4.46    ?Hemoglobin 13.0 - 17.0 g/dL 12.9   13.4   14.5    ?HCT 39.0 - 52.0 % 40.4   41.6   44.8    ?Platelets 150 - 400 K/uL 231   234   250    ?NEUT# 1.7 - 7.7 K/uL  5.6   6.1    ?Lymph# 0.7 - 4.0 K/uL  1.3   1.0    ? ? ? ?There is no height or weight on file to calculate BMI. ? ?Orders:  ?No orders of the defined types were placed in this encounter. ? ?No orders of the defined types were placed in this encounter. ? ? ? Procedures: ?No procedures performed ? ?  Clinical Data: ?No additional findings. ? ?ROS: ? ?All other systems negative, except as noted in the HPI. ?Review of Systems ? ?Objective: ?Vital Signs: There were no vitals taken for this visit. ? ?Specialty Comments:  ?No specialty comments available. ? ?PMFS History: ?Patient Active Problem List  ? Diagnosis Date Noted  ? Ulcer of right foot, with necrosis of bone ([place])   ? Cellulitis of right lower extremity 01/01/2019  ? Diabetic neurogenic arthropathy ([place]) 03/14/2018  ? Ulcer of toe of left foot, limited to breakdown of skin ([place]) 03/14/2018  ? ADD (attention deficit disorder) 11/13/2017  ? Anxiety 11/13/2017  ? HTN (hypertension) 11/13/2017  ? Pre-diabetes 11/13/2017  ? Osteomyelitis of fifth toe of right foot ([place])   ? Sepsis ([place]) 11/11/2017  ? Severe sepsis ([place]) 11/11/2017  ? Peripheral neuropathy 04/14/2013  ? ?Past Medical History:  ?Diagnosis Date  ? ADD (attention deficit disorder)   ? adult  ? ADD (attention deficit disorder)   ? Anxiety and depression   ? Arthritis   ? "probably a touch in my hands" (11/12/2017)  ? Diabetes mellitus  without complication ([place])   ? GERD (gastroesophageal reflux disease)   ? Hypertension   ? Migraine   ? "maybe 2 in my lifetime" (11/12/2017)  ? MRSA infection (methicillin-resistant Staphylococcus aureus)   ? Neuropathy   ? OSA on CPAP   ?  not using at this time  ? Restless legs   ?  ?Family History  ?Problem Relation Age of Onset  ? Cancer Mother   ? Kidney failure Father   ? Breast cancer Sister   ?  ?Past Surgical History:  ?Procedure Laterality Date  ? AMPUTATION Left 12/17/2017  ? Procedure: LEFT 2ND RAY AMPUTATION;  Surgeon: Newt Minion, MD;  Location: [facility];  Service: Orthopedics;  Laterality: Left;  ? AMPUTATION Left   ? foot x 2 at [facility]  ? AMPUTATION Right 02/07/2021  ? Procedure: RIGHT FOOT 5TH RAY AMPUTATION;  Surgeon: Newt Minion, MD;  Location: [facility];  Service: Orthopedics;  Laterality: Right;  ? WISDOM TOOTH EXTRACTION    ? ?Social History  ? ?Occupational History  ?  Comment: [employer]  ?Tobacco Use  ? Smoking status: Every Day  ?  Packs/day: 0.40  ?  Years: 33.00  ?  Pack years: 13.20  ?  Types: Cigarettes  ? Smokeless tobacco: Never  ?Vaping Use  ? Vaping Use: Never used  ?Substance and Sexual Activity  ? Alcohol use: Yes  ?  Alcohol/week: 6.0 standard drinks  ?  Types: 6 Standard drinks or equivalent per week  ? Drug use: No  ? Sexual activity: Yes  ? ? ? ? ? ?

## 2022-04-09 ENCOUNTER — Ambulatory Visit: Payer: BC Managed Care – PPO | Admitting: Orthopedic Surgery

## 2022-04-09 DIAGNOSIS — Z89432 Acquired absence of left foot: Secondary | ICD-10-CM

## 2022-04-18 ENCOUNTER — Encounter: Payer: Self-pay | Admitting: Orthopedic Surgery

## 2022-04-18 NOTE — Progress Notes (Signed)
Office Visit Note   Patient: Johnny Martinez           Date of Birth: 10-Aug-1971           MRN: [id number] Visit Date: 04/09/2022              Requested by: Curly Rim, MD [place],  [place] [address] PCP: Curly Rim, MD  Chief Complaint  Patient presents with   Left Foot - Follow-up    Left transmet amputation ulcer       HPI: Patient is a 51 year old gentleman who presents status post left transmetatarsal amputation he is currently wearing [place] shoes dry dressing change daily.  Assessment & Plan: Visit Diagnoses:  1. History of transmetatarsal amputation of left foot (HCC)     Plan: The ulcer was debrided of skin and soft tissue plan to follow-up in 5 weeks.  Follow-Up Instructions: Return in about 4 weeks (around 05/07/2022).   Ortho Exam  Patient is alert, oriented, no adenopathy, well-dressed, normal affect, normal respiratory effort. Examination patient has a persistent ulcer plantar left transmetatarsal amputation.  The ulcer does not probe to bone or tendon.  After informed consent a 10 blade knife was used to debride the skin and soft tissue back to healthy viable tissue the wound measures 4 cm in diameter after debridement and 2 mm deep.  Imaging: No results found. No images are attached to the encounter.  Labs: Lab Results  Component Value Date   HGBA1C 6.9 (H) 01/01/2019   HGBA1C 6.6 (H) 12/17/2017   ESRSEDRATE 9 04/14/2013   CRP 2.4 04/14/2013   REPTSTATUS 01/06/2019 FINAL 01/01/2019   CULT  01/01/2019    NO GROWTH 5 DAYS Performed at [facility], [address] [place],  [address]      Lab Results  Component Value Date   ALBUMIN 3.5 11/11/2017    Lab Results  Component Value Date   MG 1.6 (L) 11/12/2017   No results found for: "VD25OH"  No results found for: "PREALBUMIN"    Latest Ref Rng & Units 01/02/2019    5:10 AM 01/01/2019    7:02 PM 01/01/2019    3:18 PM  CBC EXTENDED  WBC 4.0 - 10.5 K/uL  8.1  8.2  7.8   RBC 4.22 - 5.81 MIL/uL 4.05  4.23  4.46   Hemoglobin 13.0 - 17.0 g/dL 12.9  13.4  14.5   HCT 39.0 - 52.0 % 40.4  41.6  44.8   Platelets 150 - 400 K/uL 231  234  250   NEUT# 1.7 - 7.7 K/uL  5.6  6.1   Lymph# 0.7 - 4.0 K/uL  1.3  1.0      There is no height or weight on file to calculate BMI.  Orders:  No orders of the defined types were placed in this encounter.  No orders of the defined types were placed in this encounter.    Procedures: No procedures performed  Clinical Data: No additional findings.  ROS:  All other systems negative, except as noted in the HPI. Review of Systems  Objective: Vital Signs: There were no vitals taken for this visit.  Specialty Comments:  No specialty comments available.  PMFS History: Patient Active Problem List   Diagnosis Date Noted   Ulcer of right foot, with necrosis of bone ([place])    Cellulitis of right lower extremity 01/01/2019   Diabetic neurogenic arthropathy ([place]) 03/14/2018   Ulcer of  toe of left foot, limited to breakdown of skin ([place]) 03/14/2018   ADD (attention deficit disorder) 11/13/2017   Anxiety 11/13/2017   HTN (hypertension) 11/13/2017   Pre-diabetes 11/13/2017   Osteomyelitis of fifth toe of right foot ([place])    Sepsis ([place]) 11/11/2017   Severe sepsis ([place]) 11/11/2017   Peripheral neuropathy 04/14/2013   Past Medical History:  Diagnosis Date   ADD (attention deficit disorder)    adult   ADD (attention deficit disorder)    Anxiety and depression    Arthritis    "probably a touch in my hands" (11/12/2017)   Diabetes mellitus without complication (HCC)    GERD (gastroesophageal reflux disease)    Hypertension    Migraine    "maybe 2 in my lifetime" (11/12/2017)   MRSA infection (methicillin-resistant Staphylococcus aureus)    Neuropathy    OSA on CPAP     not using at this time   Restless legs     Family History  Problem Relation Age of Onset   Cancer Mother    Kidney failure Father     Breast cancer Sister     Past Surgical History:  Procedure Laterality Date   AMPUTATION Left 12/17/2017   Procedure: LEFT 2ND RAY AMPUTATION;  Surgeon: Newt Minion, MD;  Location: [facility];  Service: Orthopedics;  Laterality: Left;   AMPUTATION Left    foot x 2 at [facility] Right 02/07/2021   Procedure: RIGHT FOOT 5TH RAY AMPUTATION;  Surgeon: Newt Minion, MD;  Location: [facility];  Service: Orthopedics;  Laterality: Right;   WISDOM TOOTH EXTRACTION     Social History   Occupational History    Comment: [employer]  Tobacco Use   Smoking status: Every Day    Packs/day: 0.40    Years: 33.00    Total pack years: 13.20    Types: Cigarettes   Smokeless tobacco: Never  Vaping Use   Vaping Use: Never used  Substance and Sexual Activity   Alcohol use: Yes    Alcohol/week: 6.0 standard drinks of alcohol    Types: 6 Standard drinks or equivalent per week   Drug use: No   Sexual activity: Yes

## 2022-05-21 ENCOUNTER — Ambulatory Visit: Payer: BC Managed Care – PPO | Admitting: Orthopedic Surgery

## 2022-05-21 DIAGNOSIS — Z89432 Acquired absence of left foot: Secondary | ICD-10-CM | POA: Diagnosis not present

## 2022-05-21 DIAGNOSIS — L97521 Non-pressure chronic ulcer of other part of left foot limited to breakdown of skin: Secondary | ICD-10-CM

## 2022-06-02 ENCOUNTER — Encounter: Payer: Self-pay | Admitting: Orthopedic Surgery

## 2022-06-02 NOTE — Progress Notes (Signed)
Office Visit Note   Patient: Johnny Martinez           Date of Birth: 10/25/1971           MRN: [id number] Visit Date: 05/21/2022              Requested by: Curly Rim, MD [place],  [place] [address] PCP: Curly Rim, MD  Chief Complaint  Patient presents with   Left Foot - Wound Check    Left Transmet amputation ulcer      HPI: Patient is a 51 year old gentleman status post left transmetatarsal amputation with a Wagner grade 1 ulcer of the left foot.  Patient also complains of painful onychomycotic nails on the right foot which she is unable to safely trim on his own.  Assessment & Plan: Visit Diagnoses:  1. History of transmetatarsal amputation of left foot ([place])   2. Non-pressure chronic ulcer of other part of left foot limited to breakdown of skin ([place])     Plan: Ulcer was debrided nails trimmed.  Follow-Up Instructions: Return in about 4 weeks (around 06/18/2022).   Ortho Exam  Patient is alert, oriented, no adenopathy, well-dressed, normal affect, normal respiratory effort. Examination patient has thickened discolored onychomycotic nails of the right foot he is unable to safely trim these on his own and the nails were trimmed x4 without complications.  Examination the left foot patient has a Wagner grade 1 ulcer beneath the left transmetatarsal amputation.  After informed consent a 10 blade knife was used to debride the skin and soft tissue back to healthy viable granulation tissue.  Silver nitrate was used for hemostasis the ulcer was 4 cm in diameter and 2 mm deep after debridement.   Imaging: No results found. No images are attached to the encounter.  Labs: Lab Results  Component Value Date   HGBA1C 6.9 (H) 01/01/2019   HGBA1C 6.6 (H) 12/17/2017   ESRSEDRATE 9 04/14/2013   CRP 2.4 04/14/2013   REPTSTATUS 01/06/2019 FINAL 01/01/2019   CULT  01/01/2019    NO GROWTH 5 DAYS Performed at [facility], [address],  [place],  [address]      Lab Results  Component Value Date   ALBUMIN 3.5 11/11/2017    Lab Results  Component Value Date   MG 1.6 (L) 11/12/2017   No results found for: "VD25OH"  No results found for: "PREALBUMIN"    Latest Ref Rng & Units 01/02/2019    5:10 AM 01/01/2019    7:02 PM 01/01/2019    3:18 PM  CBC EXTENDED  WBC 4.0 - 10.5 K/uL 8.1  8.2  7.8   RBC 4.22 - 5.81 MIL/uL 4.05  4.23  4.46   Hemoglobin 13.0 - 17.0 g/dL 12.9  13.4  14.5   HCT 39.0 - 52.0 % 40.4  41.6  44.8   Platelets 150 - 400 K/uL 231  234  250   NEUT# 1.7 - 7.7 K/uL  5.6  6.1   Lymph# 0.7 - 4.0 K/uL  1.3  1.0      There is no height or weight on file to calculate BMI.  Orders:  No orders of the defined types were placed in this encounter.  No orders of the defined types were placed in this encounter.    Procedures: No procedures performed  Clinical Data: No additional findings.  ROS:  All other systems negative, except as noted in the HPI. Review of Systems  Objective: Vital Signs: There were no vitals taken for this visit.  Specialty Comments:  No specialty comments available.  PMFS History: Patient Active Problem List   Diagnosis Date Noted   Ulcer of right foot, with necrosis of bone ([place])    Cellulitis of right lower extremity 01/01/2019   Diabetic neurogenic arthropathy ([place]) 03/14/2018   Ulcer of toe of left foot, limited to breakdown of skin ([place]) 03/14/2018   ADD (attention deficit disorder) 11/13/2017   Anxiety 11/13/2017   HTN (hypertension) 11/13/2017   Pre-diabetes 11/13/2017   Osteomyelitis of fifth toe of right foot (HCC)    Sepsis ([place]) 11/11/2017   Severe sepsis ([place]) 11/11/2017   Peripheral neuropathy 04/14/2013   Past Medical History:  Diagnosis Date   ADD (attention deficit disorder)    adult   ADD (attention deficit disorder)    Anxiety and depression    Arthritis    "probably a touch in my hands" (11/12/2017)   Diabetes mellitus without  complication (HCC)    GERD (gastroesophageal reflux disease)    Hypertension    Migraine    "maybe 2 in my lifetime" (11/12/2017)   MRSA infection (methicillin-resistant Staphylococcus aureus)    Neuropathy    OSA on CPAP     not using at this time   Restless legs     Family History  Problem Relation Age of Onset   Cancer Mother    Kidney failure Father    Breast cancer Sister     Past Surgical History:  Procedure Laterality Date   AMPUTATION Left 12/17/2017   Procedure: LEFT 2ND RAY AMPUTATION;  Surgeon: Newt Minion, MD;  Location: [facility];  Service: Orthopedics;  Laterality: Left;   AMPUTATION Left    foot x 2 at [facility] Right 02/07/2021   Procedure: RIGHT FOOT 5TH RAY AMPUTATION;  Surgeon: Newt Minion, MD;  Location: [facility];  Service: Orthopedics;  Laterality: Right;   WISDOM TOOTH EXTRACTION     Social History   Occupational History    Comment: [employer]  Tobacco Use   Smoking status: Every Day    Packs/day: 0.40    Years: 33.00    Total pack years: 13.20    Types: Cigarettes   Smokeless tobacco: Never  Vaping Use   Vaping Use: Never used  Substance and Sexual Activity   Alcohol use: Yes    Alcohol/week: 6.0 standard drinks of alcohol    Types: 6 Standard drinks or equivalent per week   Drug use: No   Sexual activity: Yes

## 2022-06-22 ENCOUNTER — Ambulatory Visit (INDEPENDENT_AMBULATORY_CARE_PROVIDER_SITE_OTHER): Payer: BC Managed Care – PPO | Admitting: Orthopedic Surgery

## 2022-06-22 DIAGNOSIS — L97521 Non-pressure chronic ulcer of other part of left foot limited to breakdown of skin: Secondary | ICD-10-CM | POA: Diagnosis not present

## 2022-06-22 DIAGNOSIS — Z89432 Acquired absence of left foot: Secondary | ICD-10-CM | POA: Diagnosis not present

## 2022-06-23 ENCOUNTER — Encounter: Payer: Self-pay | Admitting: Orthopedic Surgery

## 2022-06-23 NOTE — Progress Notes (Signed)
Office Visit Note   Patient: Johnny Martinez           Date of Birth: 1971-07-03           MRN: [id number] Visit Date: 06/22/2022              Requested by: Curly Rim, MD [place],  [place] [address] PCP: Curly Rim, MD  Chief Complaint  Patient presents with   Left Foot - Wound Check    Hx transmet amputation      HPI: Patient is a 51 year old gentleman who presents in follow-up for Wagner grade 1 ulcer left transmetatarsal amputation.  Assessment & Plan: Visit Diagnoses:  1. History of transmetatarsal amputation of left foot (Johnny Martinez)   2. Non-pressure chronic ulcer of other part of left foot limited to breakdown of skin (Johnny Martinez)     Plan: Ulcer was debrided patient shows continued improvement.  Continue current wound care with his protective sneakers.  Follow-Up Instructions: Return in about 4 weeks (around 07/20/2022).   Ortho Exam  Patient is alert, oriented, no adenopathy, well-dressed, normal affect, normal respiratory effort. Examination patient has a palpable dorsalis pedis pulse he has an ulcer over the residual limb there is no ascending cellulitis.  After informed consent a 10 blade knife was used to debride the skin and soft tissue back to healthy viable granulation tissue this was touched with silver nitrate.  The ulcer does not probe to bone or tendon.  The ulcer was 4 cm in diameter and 4 mm deep after debridement.  Imaging: No results found. No images are attached to the encounter.  Labs: Lab Results  Component Value Date   HGBA1C 6.9 (H) 01/01/2019   HGBA1C 6.6 (H) 12/17/2017   ESRSEDRATE 9 04/14/2013   CRP 2.4 04/14/2013   REPTSTATUS 01/06/2019 FINAL 01/01/2019   CULT  01/01/2019    NO GROWTH 5 DAYS Performed at [facility], [address], [place], [place] [address]      Lab Results  Component Value Date   ALBUMIN 3.5 11/11/2017    Lab Results  Component Value Date   MG 1.6 (L) 11/12/2017   No results found  for: "VD25OH"  No results found for: "PREALBUMIN"    Latest Ref Rng & Units 01/02/2019    5:10 AM 01/01/2019    7:02 PM 01/01/2019    3:18 PM  CBC EXTENDED  WBC 4.0 - 10.5 K/uL 8.1  8.2  7.8   RBC 4.22 - 5.81 MIL/uL 4.05  4.23  4.46   Hemoglobin 13.0 - 17.0 g/dL 12.9  13.4  14.5   HCT 39.0 - 52.0 % 40.4  41.6  44.8   Platelets 150 - 400 K/uL 231  234  250   NEUT# 1.7 - 7.7 K/uL  5.6  6.1   Lymph# 0.7 - 4.0 K/uL  1.3  1.0      There is no height or weight on file to calculate BMI.  Orders:  No orders of the defined types were placed in this encounter.  No orders of the defined types were placed in this encounter.    Procedures: No procedures performed  Clinical Data: No additional findings.  ROS:  All other systems negative, except as noted in the HPI. Review of Systems  Objective: Vital Signs: There were no vitals taken for this visit.  Specialty Comments:  No specialty comments available.  PMFS History: Patient Active Problem List   Diagnosis Date  Noted   Ulcer of right foot, with necrosis of bone (Johnny Martinez)    Cellulitis of right lower extremity 01/01/2019   Diabetic neurogenic arthropathy (Johnny Martinez) 03/14/2018   Ulcer of toe of left foot, limited to breakdown of skin (Johnny Martinez) 03/14/2018   ADD (attention deficit disorder) 11/13/2017   Anxiety 11/13/2017   HTN (hypertension) 11/13/2017   Pre-diabetes 11/13/2017   Osteomyelitis of fifth toe of right foot (Johnny Martinez)    Sepsis (Johnny Martinez) 11/11/2017   Severe sepsis (Johnny Martinez) 11/11/2017   Peripheral neuropathy 04/14/2013   Past Medical History:  Diagnosis Date   ADD (attention deficit disorder)    adult   ADD (attention deficit disorder)    Anxiety and depression    Arthritis    "probably a touch in my hands" (11/12/2017)   Diabetes mellitus without complication (HCC)    GERD (gastroesophageal reflux disease)    Hypertension    Migraine    "maybe 2 in my lifetime" (11/12/2017)   MRSA infection (methicillin-resistant Staphylococcus  aureus)    Neuropathy    OSA on CPAP     not using at this time   Restless legs     Family History  Problem Relation Age of Onset   Cancer Mother    Kidney failure Father    Breast cancer Sister     Past Surgical History:  Procedure Laterality Date   AMPUTATION Left 12/17/2017   Procedure: LEFT 2ND RAY AMPUTATION;  Surgeon: Johnny Minion, MD;  Location: [place];  Service: Orthopedics;  Laterality: Left;   AMPUTATION Left    foot x 2 at [place] Right 02/07/2021   Procedure: RIGHT FOOT 5TH RAY AMPUTATION;  Surgeon: Johnny Minion, MD;  Location: [place];  Service: Orthopedics;  Laterality: Right;   WISDOM TOOTH EXTRACTION     Social History   Occupational History    Comment: [employer]  Tobacco Use   Smoking status: Every Day    Packs/day: 0.40    Years: 33.00    Total pack years: 13.20    Types: Cigarettes   Smokeless tobacco: Never  Vaping Use   Vaping Use: Never used  Substance and Sexual Activity   Alcohol use: Yes    Alcohol/week: 6.0 standard drinks of alcohol    Types: 6 Standard drinks or equivalent per week   Drug use: No   Sexual activity: Yes

## 2022-07-20 ENCOUNTER — Encounter: Payer: Self-pay | Admitting: Orthopedic Surgery

## 2022-07-20 ENCOUNTER — Ambulatory Visit (INDEPENDENT_AMBULATORY_CARE_PROVIDER_SITE_OTHER): Payer: BC Managed Care – PPO | Admitting: Orthopedic Surgery

## 2022-07-20 DIAGNOSIS — Z89432 Acquired absence of left foot: Secondary | ICD-10-CM | POA: Diagnosis not present

## 2022-07-20 DIAGNOSIS — L97521 Non-pressure chronic ulcer of other part of left foot limited to breakdown of skin: Secondary | ICD-10-CM

## 2022-07-20 NOTE — Progress Notes (Signed)
Office Visit Note   Patient: Johnny Martinez           Date of Birth: [birth year]           MRN: [id number] Visit Date: 07/20/2022              Requested by: Curly Rim, MD [place],  [place] [address] PCP: Curly Rim, MD  Chief Complaint  Patient presents with   Left Foot - Wound Check    Hx transmet amputation      HPI: Patient is a 51 year old gentleman who is status post a left transmetatarsal amputation with a Wagner grade 1 ulcer plantar aspect of the left foot.  He is currently wearing protective shoe wear.  Assessment & Plan: Visit Diagnoses:  1. History of transmetatarsal amputation of left foot ([place])   2. Non-pressure chronic ulcer of other part of left foot limited to breakdown of skin ([place])     Plan: We will plan to continue current wound care protective shoe wear.  Follow-Up Instructions: Return in about 4 weeks (around 08/17/2022).   Ortho Exam  Patient is alert, oriented, no adenopathy, well-dressed, normal affect, normal respiratory effort. Examination patient has a good pulse there is no redness or cellulitis in his foot the plantar ulcer is decreasing in size with less callus.  After informed consent a 10 blade knife was used to debride the skin and soft tissue back to healthy viable granulation tissue.  Silver nitrate was used hemostasis there is no tunneling no exposed bone or tendon.  Ulcer is 3 cm in diameter and 3 mm deep after debridement.  Imaging: No results found. No images are attached to the encounter.  Labs: Lab Results  Component Value Date   HGBA1C 6.9 (H) 01/01/2019   HGBA1C 6.6 (H) 12/17/2017   ESRSEDRATE 9 04/14/2013   CRP 2.4 04/14/2013   REPTSTATUS 01/06/2019 FINAL 01/01/2019   CULT  01/01/2019    NO GROWTH 5 DAYS Performed at [facility], [address], [place], [place] [address]      Lab Results  Component Value Date   ALBUMIN 3.5 11/11/2017    Lab Results  Component Value Date   MG  1.6 (L) 11/12/2017   No results found for: "VD25OH"  No results found for: "PREALBUMIN"    Latest Ref Rng & Units 01/02/2019    5:10 AM 01/01/2019    7:02 PM 01/01/2019    3:18 PM  CBC EXTENDED  WBC 4.0 - 10.5 K/uL 8.1  8.2  7.8   RBC 4.22 - 5.81 MIL/uL 4.05  4.23  4.46   Hemoglobin 13.0 - 17.0 g/dL 12.9  13.4  14.5   HCT 39.0 - 52.0 % 40.4  41.6  44.8   Platelets 150 - 400 K/uL 231  234  250   NEUT# 1.7 - 7.7 K/uL  5.6  6.1   Lymph# 0.7 - 4.0 K/uL  1.3  1.0      There is no height or weight on file to calculate BMI.  Orders:  No orders of the defined types were placed in this encounter.  No orders of the defined types were placed in this encounter.    Procedures: No procedures performed  Clinical Data: No additional findings.  ROS:  All other systems negative, except as noted in the HPI. Review of Systems  Objective: Vital Signs: There were no vitals taken for this visit.  Specialty Comments:  No specialty  comments available.  PMFS History: Patient Active Problem List   Diagnosis Date Noted   Ulcer of right foot, with necrosis of bone ([place])    Cellulitis of right lower extremity 01/01/2019   Diabetic neurogenic arthropathy ([place]) 03/14/2018   Ulcer of toe of left foot, limited to breakdown of skin ([place]) 03/14/2018   ADD (attention deficit disorder) 11/13/2017   Anxiety 11/13/2017   HTN (hypertension) 11/13/2017   Pre-diabetes 11/13/2017   Osteomyelitis of fifth toe of right foot (HCC)    Sepsis ([place]) 11/11/2017   Severe sepsis ([place]) 11/11/2017   Peripheral neuropathy 04/14/2013   Past Medical History:  Diagnosis Date   ADD (attention deficit disorder)    adult   ADD (attention deficit disorder)    Anxiety and depression    Arthritis    "probably a touch in my hands" (11/12/2017)   Diabetes mellitus without complication (HCC)    GERD (gastroesophageal reflux disease)    Hypertension    Migraine    "maybe 2 in my lifetime" (11/12/2017)   MRSA infection  (methicillin-resistant Staphylococcus aureus)    Neuropathy    OSA on CPAP     not using at this time   Restless legs     Family History  Problem Relation Age of Onset   Cancer Mother    Kidney failure Father    Breast cancer Sister     Past Surgical History:  Procedure Laterality Date   AMPUTATION Left 12/17/2017   Procedure: LEFT 2ND RAY AMPUTATION;  Surgeon: Newt Minion, MD;  Location: [place];  Service: Orthopedics;  Laterality: Left;   AMPUTATION Left    foot x 2 at [place] Right 02/07/2021   Procedure: RIGHT FOOT 5TH RAY AMPUTATION;  Surgeon: Newt Minion, MD;  Location: [place];  Service: Orthopedics;  Laterality: Right;   WISDOM TOOTH EXTRACTION     Social History   Occupational History    Comment: [employer]  Tobacco Use   Smoking status: Every Day    Packs/day: 0.40    Years: 33.00    Total pack years: 13.20    Types: Cigarettes   Smokeless tobacco: Never  Vaping Use   Vaping Use: Never used  Substance and Sexual Activity   Alcohol use: Yes    Alcohol/week: 6.0 standard drinks of alcohol    Types: 6 Standard drinks or equivalent per week   Drug use: No   Sexual activity: Yes

## 2022-08-17 ENCOUNTER — Ambulatory Visit (INDEPENDENT_AMBULATORY_CARE_PROVIDER_SITE_OTHER): Payer: BC Managed Care – PPO | Admitting: Orthopedic Surgery

## 2022-08-17 DIAGNOSIS — Z89432 Acquired absence of left foot: Secondary | ICD-10-CM | POA: Diagnosis not present

## 2022-08-17 DIAGNOSIS — L97521 Non-pressure chronic ulcer of other part of left foot limited to breakdown of skin: Secondary | ICD-10-CM | POA: Diagnosis not present

## 2022-08-18 ENCOUNTER — Encounter: Payer: Self-pay | Admitting: Orthopedic Surgery

## 2022-08-18 NOTE — Progress Notes (Signed)
Office Visit Note   Patient: Johnny Martinez           Date of Birth: [birth year]           MRN: [id number] Visit Date: 08/17/2022              Requested by: Curly Rim, MD [place],  [place] [id number] PCP: Curly Rim, MD  Chief Complaint  Patient presents with   Left Foot - Wound Check, Follow-up    Hx transmet amputation      HPI: Patient is a 51 year old gentleman who presents in follow-up for a ulcer plantar aspect left transmetatarsal amputation.  Assessment & Plan: Visit Diagnoses:  1. History of transmetatarsal amputation of left foot ([place])   2. Non-pressure chronic ulcer of other part of left foot limited to breakdown of skin ([place])     Plan: Nails were trimmed x5 on the right ulcer debrided on the left.  Continue with protective shoe wear.  Follow-Up Instructions: Return in about 4 weeks (around 09/14/2022).   Ortho Exam  Patient is alert, oriented, no adenopathy, well-dressed, normal affect, normal respiratory effort. Examination patient has thickened discolored onychomycotic nails x5 in the right foot.  These were trimmed without complications.  Examination the left foot patient has a persistent Wagner grade 1 ulcer.  After informed consent a 10 blade knife was used to debride the skin and soft tissue back to healthy viable granulation tissue this was touched with silver nitrate there was no tunneling no exposed bone or tendon after debridement the ulcer was 4 cm in diameter and 2 mm deep.  Silver nitrate was used hemostasis.  Imaging: No results found. No images are attached to the encounter.  Labs: Lab Results  Component Value Date   HGBA1C 6.9 (H) 01/01/2019   HGBA1C 6.6 (H) 12/17/2017   ESRSEDRATE 9 04/14/2013   CRP 2.4 04/14/2013   REPTSTATUS 01/06/2019 FINAL 01/01/2019   CULT  01/01/2019    NO GROWTH 5 DAYS Performed at [facility], [address], [place], [place] [address]      Lab Results  Component Value Date    ALBUMIN 3.5 11/11/2017    Lab Results  Component Value Date   MG 1.6 (L) 11/12/2017   No results found for: "VD25OH"  No results found for: "PREALBUMIN"    Latest Ref Rng & Units 01/02/2019    5:10 AM 01/01/2019    7:02 PM 01/01/2019    3:18 PM  CBC EXTENDED  WBC 4.0 - 10.5 K/uL 8.1  8.2  7.8   RBC 4.22 - 5.81 MIL/uL 4.05  4.23  4.46   Hemoglobin 13.0 - 17.0 g/dL 12.9  13.4  14.5   HCT 39.0 - 52.0 % 40.4  41.6  44.8   Platelets 150 - 400 K/uL 231  234  250   NEUT# 1.7 - 7.7 K/uL  5.6  6.1   Lymph# 0.7 - 4.0 K/uL  1.3  1.0      There is no height or weight on file to calculate BMI.  Orders:  No orders of the defined types were placed in this encounter.  No orders of the defined types were placed in this encounter.    Procedures: No procedures performed  Clinical Data: No additional findings.  ROS:  All other systems negative, except as noted in the HPI. Review of Systems  Objective: Vital Signs: There were no vitals taken for this visit.  Specialty  Comments:  No specialty comments available.  PMFS History: Patient Active Problem List   Diagnosis Date Noted   Ulcer of right foot, with necrosis of bone ([place])    Cellulitis of right lower extremity 01/01/2019   Diabetic neurogenic arthropathy ([place]) 03/14/2018   Ulcer of toe of left foot, limited to breakdown of skin ([place]) 03/14/2018   ADD (attention deficit disorder) 11/13/2017   Anxiety 11/13/2017   HTN (hypertension) 11/13/2017   Pre-diabetes 11/13/2017   Osteomyelitis of fifth toe of right foot (HCC)    Sepsis ([place]) 11/11/2017   Severe sepsis ([place]) 11/11/2017   Peripheral neuropathy 04/14/2013   Past Medical History:  Diagnosis Date   ADD (attention deficit disorder)    adult   ADD (attention deficit disorder)    Anxiety and depression    Arthritis    "probably a touch in my hands" (11/12/2017)   Diabetes mellitus without complication (HCC)    GERD (gastroesophageal reflux disease)     Hypertension    Migraine    "maybe 2 in my lifetime" (11/12/2017)   MRSA infection (methicillin-resistant Staphylococcus aureus)    Neuropathy    OSA on CPAP     not using at this time   Restless legs     Family History  Problem Relation Age of Onset   Cancer Mother    Kidney failure Father    Breast cancer Sister     Past Surgical History:  Procedure Laterality Date   AMPUTATION Left 12/17/2017   Procedure: LEFT 2ND RAY AMPUTATION;  Surgeon: Newt Minion, MD;  Location: [place];  Service: Orthopedics;  Laterality: Left;   AMPUTATION Left    foot x 2 at [place] Right 02/07/2021   Procedure: RIGHT FOOT 5TH RAY AMPUTATION;  Surgeon: Newt Minion, MD;  Location: [place];  Service: Orthopedics;  Laterality: Right;   WISDOM TOOTH EXTRACTION     Social History   Occupational History    Comment: [employer]  Tobacco Use   Smoking status: Every Day    Packs/day: 0.40    Years: 33.00    Total pack years: 13.20    Types: Cigarettes   Smokeless tobacco: Never  Vaping Use   Vaping Use: Never used  Substance and Sexual Activity   Alcohol use: Yes    Alcohol/week: 6.0 standard drinks of alcohol    Types: 6 Standard drinks or equivalent per week   Drug use: No   Sexual activity: Yes

## 2022-09-21 ENCOUNTER — Ambulatory Visit: Payer: BC Managed Care – PPO | Admitting: Orthopedic Surgery

## 2022-10-05 ENCOUNTER — Ambulatory Visit (INDEPENDENT_AMBULATORY_CARE_PROVIDER_SITE_OTHER): Payer: BC Managed Care – PPO | Admitting: Orthopedic Surgery

## 2022-10-05 ENCOUNTER — Ambulatory Visit (INDEPENDENT_AMBULATORY_CARE_PROVIDER_SITE_OTHER): Payer: BC Managed Care – PPO

## 2022-10-05 DIAGNOSIS — L97521 Non-pressure chronic ulcer of other part of left foot limited to breakdown of skin: Secondary | ICD-10-CM

## 2022-10-05 DIAGNOSIS — M86272 Subacute osteomyelitis, left ankle and foot: Secondary | ICD-10-CM

## 2022-10-06 ENCOUNTER — Encounter: Payer: Self-pay | Admitting: Orthopedic Surgery

## 2022-10-06 NOTE — Progress Notes (Signed)
Office Visit Note   Patient: Johnny Martinez           Date of Birth: July 24, 1971           MRN: [id number] Visit Date: 10/05/2022              Requested by: Curly Rim, MD [place],  [place] [address] PCP: Curly Rim, MD  Chief Complaint  Patient presents with   Left Foot - Wound Check      HPI: Patient is a 51 year old gentleman who is seen in follow-up for Wagner grade 1 ulcer plantar aspect left foot status post transmetatarsal amputation.  Assessment & Plan: Visit Diagnoses:  1. Non-pressure chronic ulcer of other part of left foot limited to breakdown of skin ([place])   2. Subacute osteomyelitis of left foot ([place])     Plan: Recommended proceeding with a revision of the transmetatarsal amputation due to the osteomyelitis of the second and third metatarsals.  Patient would like to continue with conservative treatment and plan for revision in March or April.  Follow-Up Instructions: Return in about 4 weeks (around 11/02/2022).   Ortho Exam  Patient is alert, oriented, no adenopathy, well-dressed, normal affect, normal respiratory effort. Examination the ulcer on the plantar aspect of the left foot now probes to bone.  After informed consent a 10 blade knife was used debride the skin and soft tissue back to healthy viable granulation tissue.  Silver nitrate was used for hemostasis.  The wound now probes to the second and third metatarsals consistent with osteomyelitis.  Radiographs show osteomyelitis of the second and third metatarsals.  Imaging: XR Foot Complete Left  Result Date: 10/06/2022 Three-view radiographs of the left foot shows destructive bony changes of the second and third metatarsal  No images are attached to the encounter.  Labs: Lab Results  Component Value Date   HGBA1C 6.9 (H) 01/01/2019   HGBA1C 6.6 (H) 12/17/2017   ESRSEDRATE 9 04/14/2013   CRP 2.4 04/14/2013   REPTSTATUS 01/06/2019 FINAL 01/01/2019   CULT  01/01/2019     NO GROWTH 5 DAYS Performed at [facility], [address], [place], [place] [address]      Lab Results  Component Value Date   ALBUMIN 3.5 11/11/2017    Lab Results  Component Value Date   MG 1.6 (L) 11/12/2017   No results found for: "VD25OH"  No results found for: "PREALBUMIN"    Latest Ref Rng & Units 01/02/2019    5:10 AM 01/01/2019    7:02 PM 01/01/2019    3:18 PM  CBC EXTENDED  WBC 4.0 - 10.5 K/uL 8.1  8.2  7.8   RBC 4.22 - 5.81 MIL/uL 4.05  4.23  4.46   Hemoglobin 13.0 - 17.0 g/dL 12.9  13.4  14.5   HCT 39.0 - 52.0 % 40.4  41.6  44.8   Platelets 150 - 400 K/uL 231  234  250   NEUT# 1.7 - 7.7 K/uL  5.6  6.1   Lymph# 0.7 - 4.0 K/uL  1.3  1.0      There is no height or weight on file to calculate BMI.  Orders:  Orders Placed This Encounter  Procedures   XR Foot Complete Left   No orders of the defined types were placed in this encounter.    Procedures: No procedures performed  Clinical Data: No additional findings.  ROS:  All other systems negative, except as noted in  the HPI. Review of Systems  Objective: Vital Signs: There were no vitals taken for this visit.  Specialty Comments:  No specialty comments available.  PMFS History: Patient Active Problem List   Diagnosis Date Noted   Ulcer of right foot, with necrosis of bone ([place])    Cellulitis of right lower extremity 01/01/2019   Diabetic neurogenic arthropathy ([place]) 03/14/2018   Ulcer of toe of left foot, limited to breakdown of skin ([place]) 03/14/2018   ADD (attention deficit disorder) 11/13/2017   Anxiety 11/13/2017   HTN (hypertension) 11/13/2017   Pre-diabetes 11/13/2017   Osteomyelitis of fifth toe of right foot (HCC)    Sepsis ([place]) 11/11/2017   Severe sepsis ([place]) 11/11/2017   Peripheral neuropathy 04/14/2013   Past Medical History:  Diagnosis Date   ADD (attention deficit disorder)    adult   ADD (attention deficit disorder)    Anxiety and depression    Arthritis     "probably a touch in my hands" (11/12/2017)   Diabetes mellitus without complication (HCC)    GERD (gastroesophageal reflux disease)    Hypertension    Migraine    "maybe 2 in my lifetime" (11/12/2017)   MRSA infection (methicillin-resistant Staphylococcus aureus)    Neuropathy    OSA on CPAP     not using at this time   Restless legs     Family History  Problem Relation Age of Onset   Cancer Mother    Kidney failure Father    Breast cancer Sister     Past Surgical History:  Procedure Laterality Date   AMPUTATION Left 12/17/2017   Procedure: LEFT 2ND RAY AMPUTATION;  Surgeon: Newt Minion, MD;  Location: [place];  Service: Orthopedics;  Laterality: Left;   AMPUTATION Left    foot x 2 at [place] Right 02/07/2021   Procedure: RIGHT FOOT 5TH RAY AMPUTATION;  Surgeon: Newt Minion, MD;  Location: [place];  Service: Orthopedics;  Laterality: Right;   WISDOM TOOTH EXTRACTION     Social History   Occupational History    Comment: [employer]  Tobacco Use   Smoking status: Every Day    Packs/day: 0.40    Years: 33.00    Total pack years: 13.20    Types: Cigarettes   Smokeless tobacco: Never  Vaping Use   Vaping Use: Never used  Substance and Sexual Activity   Alcohol use: Yes    Alcohol/week: 6.0 standard drinks of alcohol    Types: 6 Standard drinks or equivalent per week   Drug use: No   Sexual activity: Yes

## 2022-11-09 ENCOUNTER — Ambulatory Visit (INDEPENDENT_AMBULATORY_CARE_PROVIDER_SITE_OTHER): Payer: 59 | Admitting: Orthopedic Surgery

## 2022-11-09 DIAGNOSIS — L97521 Non-pressure chronic ulcer of other part of left foot limited to breakdown of skin: Secondary | ICD-10-CM

## 2022-11-09 DIAGNOSIS — Z89432 Acquired absence of left foot: Secondary | ICD-10-CM

## 2022-11-13 ENCOUNTER — Encounter: Payer: Self-pay | Admitting: Orthopedic Surgery

## 2022-11-13 NOTE — Progress Notes (Signed)
Office Visit Note   Patient: Johnny Martinez           Date of Birth: 06/07/1971           MRN: [id number] Visit Date: 11/09/2022              Requested by: Curly Rim, MD [place],  [place] [address] PCP: Curly Rim, MD  Chief Complaint  Patient presents with   Left Foot - Wound Check    Hx left transmet amputation      HPI: Patient is a 52 year old gentleman who presents in follow-up for ulceration left foot transmetatarsal amputation.  Assessment & Plan: Visit Diagnoses:  1. Non-pressure chronic ulcer of other part of left foot limited to breakdown of skin ([place])   2. History of transmetatarsal amputation of left foot ([place])     Plan: Ulcer debrided.  Patient states she would like to consider revision amputation surgery.  He will follow-up February 1.  Follow-Up Instructions: No follow-ups on file.   Ortho Exam  Patient is alert, oriented, no adenopathy, well-dressed, normal affect, normal respiratory effort. Examination patient has a Wagner grade 1 ulcer left transmetatarsal amputation with healthy granulation tissue.  He has venous stasis changes bilaterally with no venous ulcers.  After informed consent a 10 blade knife was used to debride the skin and soft tissue back to healthy viable granulation tissue.  The wound measures 3 x 4 cm and 1 mm deep after debridement.  Imaging: No results found. No images are attached to the encounter.  Labs: Lab Results  Component Value Date   HGBA1C 6.9 (H) 01/01/2019   HGBA1C 6.6 (H) 12/17/2017   ESRSEDRATE 9 04/14/2013   CRP 2.4 04/14/2013   REPTSTATUS 01/06/2019 FINAL 01/01/2019   CULT  01/01/2019    NO GROWTH 5 DAYS Performed at [facility], [address] [place], [place] [address]      Lab Results  Component Value Date   ALBUMIN 3.5 11/11/2017    Lab Results  Component Value Date   MG 1.6 (L) 11/12/2017   No results found for: "VD25OH"  No results found for:  "PREALBUMIN"    Latest Ref Rng & Units 01/02/2019    5:10 AM 01/01/2019    7:02 PM 01/01/2019    3:18 PM  CBC EXTENDED  WBC 4.0 - 10.5 K/uL 8.1  8.2  7.8   RBC 4.22 - 5.81 MIL/uL 4.05  4.23  4.46   Hemoglobin 13.0 - 17.0 g/dL 12.9  13.4  14.5   HCT 39.0 - 52.0 % 40.4  41.6  44.8   Platelets 150 - 400 K/uL 231  234  250   NEUT# 1.7 - 7.7 K/uL  5.6  6.1   Lymph# 0.7 - 4.0 K/uL  1.3  1.0      There is no height or weight on file to calculate BMI.  Orders:  No orders of the defined types were placed in this encounter.  No orders of the defined types were placed in this encounter.    Procedures: No procedures performed  Clinical Data: No additional findings.  ROS:  All other systems negative, except as noted in the HPI. Review of Systems  Objective: Vital Signs: There were no vitals taken for this visit.  Specialty Comments:  No specialty comments available.  PMFS History: Patient Active Problem List   Diagnosis Date Noted   Ulcer of right foot, with necrosis of bone ([place])  Cellulitis of right lower extremity 01/01/2019   Diabetic neurogenic arthropathy ([place]) 03/14/2018   Ulcer of toe of left foot, limited to breakdown of skin ([place]) 03/14/2018   ADD (attention deficit disorder) 11/13/2017   Anxiety 11/13/2017   HTN (hypertension) 11/13/2017   Pre-diabetes 11/13/2017   Osteomyelitis of fifth toe of right foot ([place])    Sepsis ([place]) 11/11/2017   Severe sepsis ([place]) 11/11/2017   Peripheral neuropathy 04/14/2013   Past Medical History:  Diagnosis Date   ADD (attention deficit disorder)    adult   ADD (attention deficit disorder)    Anxiety and depression    Arthritis    "probably a touch in my hands" (11/12/2017)   Diabetes mellitus without complication (HCC)    GERD (gastroesophageal reflux disease)    Hypertension    Migraine    "maybe 2 in my lifetime" (11/12/2017)   MRSA infection (methicillin-resistant Staphylococcus aureus)    Neuropathy    OSA on CPAP      not using at this time   Restless legs     Family History  Problem Relation Age of Onset   Cancer Mother    Kidney failure Father    Breast cancer Sister     Past Surgical History:  Procedure Laterality Date   AMPUTATION Left 12/17/2017   Procedure: LEFT 2ND RAY AMPUTATION;  Surgeon: Newt Minion, MD;  Location: [facility];  Service: Orthopedics;  Laterality: Left;   AMPUTATION Left    foot x 2 at [place] Right 02/07/2021   Procedure: RIGHT FOOT 5TH RAY AMPUTATION;  Surgeon: Newt Minion, MD;  Location: [facility];  Service: Orthopedics;  Laterality: Right;   WISDOM TOOTH EXTRACTION     Social History   Occupational History    Comment: [employer]  Tobacco Use   Smoking status: Every Day    Packs/day: 0.40    Years: 33.00    Total pack years: 13.20    Types: Cigarettes   Smokeless tobacco: Never  Vaping Use   Vaping Use: Never used  Substance and Sexual Activity   Alcohol use: Yes    Alcohol/week: 6.0 standard drinks of alcohol    Types: 6 Standard drinks or equivalent per week   Drug use: No   Sexual activity: Yes

## 2022-12-03 DIAGNOSIS — C801 Malignant (primary) neoplasm, unspecified: Secondary | ICD-10-CM

## 2022-12-03 HISTORY — DX: Malignant (primary) neoplasm, unspecified: C80.1

## 2022-12-07 ENCOUNTER — Ambulatory Visit (INDEPENDENT_AMBULATORY_CARE_PROVIDER_SITE_OTHER): Payer: Self-pay | Admitting: Orthopedic Surgery

## 2022-12-07 DIAGNOSIS — Z89432 Acquired absence of left foot: Secondary | ICD-10-CM

## 2022-12-07 DIAGNOSIS — T8781 Dehiscence of amputation stump: Secondary | ICD-10-CM

## 2022-12-09 ENCOUNTER — Encounter: Payer: Self-pay | Admitting: Orthopedic Surgery

## 2022-12-09 NOTE — Progress Notes (Signed)
Office Visit Note   Patient: Johnny Martinez           Date of Birth: April 08, 1971           MRN: [id number] Visit Date: 12/07/2022              Requested by: Curly Rim, MD [place],  [place] [address] PCP: Curly Rim, MD  Chief Complaint  Patient presents with   Left Foot - Follow-up    Hx TMA      HPI: Patient is a 52 year old gentleman who presents in follow-up for left transmetatarsal amputation with a large Wagner grade 1 plantar ulcer.  Patient also complains of painful onychomycotic nails on the right.  Assessment & Plan: Visit Diagnoses:  1. History of transmetatarsal amputation of left foot ([place])   2. Dehiscence of amputation stump of left lower extremity (HCC)     Plan: Nails were trimmed x 4 on the right ulcer debrided on the left.  Patient states that due to the failure of conservative wound care he would like to proceed with revision surgery with a revision of the transmetatarsal amputation.  Patient states he would like to proceed and the beginning of March.  Follow-Up Instructions: Return in about 4 weeks (around 01/04/2023).   Ortho Exam  Patient is alert, oriented, no adenopathy, well-dressed, normal affect, normal respiratory effort. Examination patient has thickened discolored onychomycotic nails on the right foot.  After informed consent the nails were trimmed x 4 without complications no signs of infection.  Family is to the left foot patient has a plantar Wagner grade 1 ulcer.  After informed consent a 10 blade knife was used to debride the skin and soft tissue back to healthy viable bleeding granulation tissue.  After debridement the ulcer is 3 cm in diameter.  Imaging: No results found. No images are attached to the encounter.  Labs: Lab Results  Component Value Date   HGBA1C 6.9 (H) 01/01/2019   HGBA1C 6.6 (H) 12/17/2017   ESRSEDRATE 9 04/14/2013   CRP 2.4 04/14/2013   REPTSTATUS 01/06/2019 FINAL 01/01/2019   CULT   01/01/2019    NO GROWTH 5 DAYS Performed at [facility], [address], [place], [place] [address]      Lab Results  Component Value Date   ALBUMIN 3.5 11/11/2017    Lab Results  Component Value Date   MG 1.6 (L) 11/12/2017   No results found for: "VD25OH"  No results found for: "PREALBUMIN"    Latest Ref Rng & Units 01/02/2019    5:10 AM 01/01/2019    7:02 PM 01/01/2019    3:18 PM  CBC EXTENDED  WBC 4.0 - 10.5 K/uL 8.1  8.2  7.8   RBC 4.22 - 5.81 MIL/uL 4.05  4.23  4.46   Hemoglobin 13.0 - 17.0 g/dL 12.9  13.4  14.5   HCT 39.0 - 52.0 % 40.4  41.6  44.8   Platelets 150 - 400 K/uL 231  234  250   NEUT# 1.7 - 7.7 K/uL  5.6  6.1   Lymph# 0.7 - 4.0 K/uL  1.3  1.0      There is no height or weight on file to calculate BMI.  Orders:  No orders of the defined types were placed in this encounter.  No orders of the defined types were placed in this encounter.    Procedures: No procedures performed  Clinical Data: No additional findings.  ROS:  All other systems negative, except as noted in the HPI. Review of Systems  Objective: Vital Signs: There were no vitals taken for this visit.  Specialty Comments:  No specialty comments available.  PMFS History: Patient Active Problem List   Diagnosis Date Noted   Ulcer of right foot, with necrosis of bone ([place])    Cellulitis of right lower extremity 01/01/2019   Diabetic neurogenic arthropathy ([place]) 03/14/2018   Ulcer of toe of left foot, limited to breakdown of skin ([place]) 03/14/2018   ADD (attention deficit disorder) 11/13/2017   Anxiety 11/13/2017   HTN (hypertension) 11/13/2017   Pre-diabetes 11/13/2017   Osteomyelitis of fifth toe of right foot (HCC)    Sepsis ([place]) 11/11/2017   Severe sepsis ([place]) 11/11/2017   Peripheral neuropathy 04/14/2013   Past Medical History:  Diagnosis Date   ADD (attention deficit disorder)    adult   ADD (attention deficit disorder)    Anxiety and depression    Arthritis     "probably a touch in my hands" (11/12/2017)   Diabetes mellitus without complication (HCC)    GERD (gastroesophageal reflux disease)    Hypertension    Migraine    "maybe 2 in my lifetime" (11/12/2017)   MRSA infection (methicillin-resistant Staphylococcus aureus)    Neuropathy    OSA on CPAP     not using at this time   Restless legs     Family History  Problem Relation Age of Onset   Cancer Mother    Kidney failure Father    Breast cancer Sister     Past Surgical History:  Procedure Laterality Date   AMPUTATION Left 12/17/2017   Procedure: LEFT 2ND RAY AMPUTATION;  Surgeon: Newt Minion, MD;  Location: [facility];  Service: Orthopedics;  Laterality: Left;   AMPUTATION Left    foot x 2 at [place] Right 02/07/2021   Procedure: RIGHT FOOT 5TH RAY AMPUTATION;  Surgeon: Newt Minion, MD;  Location: [facility];  Service: Orthopedics;  Laterality: Right;   WISDOM TOOTH EXTRACTION     Social History   Occupational History    Comment: [employer]  Tobacco Use   Smoking status: Every Day    Packs/day: 0.40    Years: 33.00    Total pack years: 13.20    Types: Cigarettes   Smokeless tobacco: Never  Vaping Use   Vaping Use: Never used  Substance and Sexual Activity   Alcohol use: Yes    Alcohol/week: 6.0 standard drinks of alcohol    Types: 6 Standard drinks or equivalent per week   Drug use: No   Sexual activity: Yes

## 2023-01-04 ENCOUNTER — Encounter (HOSPITAL_COMMUNITY): Payer: Self-pay | Admitting: Orthopedic Surgery

## 2023-01-04 NOTE — Progress Notes (Signed)
PCP - Pablo Lawrence, NP  Dr Talbert [place] Cardiologist - n/a  Chest x-ray - n/a EKG - DOS Stress Test - n/a ECHO - n/a Cardiac Cath - n/a  ICD Pacemaker/Loop - n/a  Sleep Study -  Yes CPAP - does not use CPAP  Diabetes Type 2 Do not take Glipizide on the morning of surgery..  If your blood sugar is less than 70 mg/dL, you will need to treat for low blood sugar: Treat a low blood sugar (less than 70 mg/dL) with  cup of clear juice (cranberry or apple), 4 glucose tablets, OR glucose gel. Recheck blood sugar in 15 minutes after treatment (to make sure it is greater than 70 mg/dL). If your blood sugar is not greater than 70 mg/dL on recheck, call [phone] for further instructions.  ERAS: Clears til 5:30 AM DOS  Anesthesia review: Yes  STOP now taking any Aspirin (unless otherwise instructed by your surgeon), Aleve, Naproxen, Ibuprofen, Motrin, Advil, Goody's, BC's, all herbal medications, fish oil, and all vitamins.   Coronavirus Screening Do you have any of the following symptoms:  Cough -Yes, occasional Fever (>100.67F)  yes/no: No Runny nose yes/no: No Sore throat yes/no: No Difficulty breathing/shortness of breath  yes/no: No  Have you traveled in the last 14 days and where? yes/no: No  Patient verbalized understanding of instructions that were given via phone.

## 2023-01-05 ENCOUNTER — Encounter (HOSPITAL_COMMUNITY): Payer: Self-pay | Admitting: Orthopedic Surgery

## 2023-01-06 ENCOUNTER — Other Ambulatory Visit: Payer: Self-pay

## 2023-01-06 ENCOUNTER — Encounter (HOSPITAL_COMMUNITY): Payer: Self-pay | Admitting: Orthopedic Surgery

## 2023-01-06 ENCOUNTER — Inpatient Hospital Stay (HOSPITAL_COMMUNITY)
Admission: RE | Admit: 2023-01-06 | Discharge: 2023-01-07 | DRG: 502 | Disposition: A | Discharge status: Home / Self Care | Payer: Medicare HMO | Attending: Orthopedic Surgery | Admitting: Orthopedic Surgery

## 2023-01-06 ENCOUNTER — Encounter (HOSPITAL_COMMUNITY)
Admission: RE | Disposition: A | Discharge status: Home / Self Care | Payer: Self-pay | Source: Home / Self Care | Attending: Orthopedic Surgery

## 2023-01-06 ENCOUNTER — Inpatient Hospital Stay (HOSPITAL_COMMUNITY): Payer: Medicare HMO | Admitting: Physician Assistant

## 2023-01-06 DIAGNOSIS — Y838 Other surgical procedures as the cause of abnormal reaction of the patient, or of later complication, without mention of misadventure at the time of the procedure: Secondary | ICD-10-CM | POA: Diagnosis present

## 2023-01-06 DIAGNOSIS — Z85828 Personal history of other malignant neoplasm of skin: Secondary | ICD-10-CM

## 2023-01-06 DIAGNOSIS — L97529 Non-pressure chronic ulcer of other part of left foot with unspecified severity: Secondary | ICD-10-CM | POA: Diagnosis present

## 2023-01-06 DIAGNOSIS — Z841 Family history of disorders of kidney and ureter: Secondary | ICD-10-CM | POA: Diagnosis not present

## 2023-01-06 DIAGNOSIS — F17210 Nicotine dependence, cigarettes, uncomplicated: Secondary | ICD-10-CM | POA: Diagnosis present

## 2023-01-06 DIAGNOSIS — Z8616 Personal history of COVID-19: Secondary | ICD-10-CM

## 2023-01-06 DIAGNOSIS — Z79899 Other long term (current) drug therapy: Secondary | ICD-10-CM | POA: Diagnosis not present

## 2023-01-06 DIAGNOSIS — G2581 Restless legs syndrome: Secondary | ICD-10-CM | POA: Diagnosis present

## 2023-01-06 DIAGNOSIS — G4730 Sleep apnea, unspecified: Secondary | ICD-10-CM

## 2023-01-06 DIAGNOSIS — F988 Other specified behavioral and emotional disorders with onset usually occurring in childhood and adolescence: Secondary | ICD-10-CM | POA: Diagnosis present

## 2023-01-06 DIAGNOSIS — T8781 Dehiscence of amputation stump: Secondary | ICD-10-CM | POA: Diagnosis present

## 2023-01-06 DIAGNOSIS — T8789 Other complications of amputation stump: Secondary | ICD-10-CM

## 2023-01-06 DIAGNOSIS — I10 Essential (primary) hypertension: Secondary | ICD-10-CM

## 2023-01-06 DIAGNOSIS — Z91013 Allergy to seafood: Secondary | ICD-10-CM | POA: Diagnosis not present

## 2023-01-06 DIAGNOSIS — G4733 Obstructive sleep apnea (adult) (pediatric): Secondary | ICD-10-CM | POA: Diagnosis present

## 2023-01-06 DIAGNOSIS — Z803 Family history of malignant neoplasm of breast: Secondary | ICD-10-CM

## 2023-01-06 DIAGNOSIS — E11621 Type 2 diabetes mellitus with foot ulcer: Secondary | ICD-10-CM | POA: Diagnosis present

## 2023-01-06 DIAGNOSIS — Z794 Long term (current) use of insulin: Secondary | ICD-10-CM

## 2023-01-06 DIAGNOSIS — Z7985 Long-term (current) use of injectable non-insulin antidiabetic drugs: Secondary | ICD-10-CM

## 2023-01-06 DIAGNOSIS — Z89432 Acquired absence of left foot: Secondary | ICD-10-CM | POA: Diagnosis present

## 2023-01-06 DIAGNOSIS — Z7984 Long term (current) use of oral hypoglycemic drugs: Secondary | ICD-10-CM | POA: Diagnosis not present

## 2023-01-06 HISTORY — PX: STUMP REVISION: SHX6102

## 2023-01-06 LAB — BASIC METABOLIC PANEL
Anion gap: 12 (ref 5–15)
BUN: 10 mg/dL (ref 6–20)
CO2: 25 mmol/L (ref 22–32)
Calcium: 9.1 mg/dL (ref 8.9–10.3)
Chloride: 97 mmol/L — ABNORMAL LOW (ref 98–111)
Creatinine, Ser: 0.78 mg/dL (ref 0.61–1.24)
GFR, Estimated: >60 mL/min (ref >60)
Glucose, Bld: 212 mg/dL — ABNORMAL HIGH (ref 70–99)
Potassium: 4.1 mmol/L (ref 3.5–5.1)
Sodium: 134 mmol/L — ABNORMAL LOW (ref 135–145)

## 2023-01-06 LAB — CBC
HCT: 46.6 % (ref 39.0–52.0)
Hemoglobin: 15.6 g/dL (ref 13.0–17.0)
MCH: 31.3 pg (ref 26.0–34.0)
MCHC: 33.5 g/dL (ref 30.0–36.0)
MCV: 93.4 fL (ref 80.0–100.0)
Platelets: 384 10*3/uL (ref 150–400)
RBC: 4.99 MIL/uL (ref 4.22–5.81)
RDW: 12.6 % (ref 11.5–15.5)
WBC: 11.7 10*3/uL — ABNORMAL HIGH (ref 4.0–10.5)
nRBC: 0 % (ref 0.0–0.2)

## 2023-01-06 LAB — GLUCOSE, CAPILLARY
Glucose-Capillary: 253 mg/dL — ABNORMAL HIGH (ref 70–99)
Glucose-Capillary: 203 mg/dL — ABNORMAL HIGH (ref 70–99)
Glucose-Capillary: 187 mg/dL — ABNORMAL HIGH (ref 70–99)
Glucose-Capillary: 196 mg/dL — ABNORMAL HIGH (ref 70–99)
Glucose-Capillary: 132 mg/dL — ABNORMAL HIGH (ref 70–99)

## 2023-01-06 SURGERY — REVISION, AMPUTATION SITE
Anesthesia: Regional | Laterality: Left

## 2023-01-06 MED ORDER — PHENOL 1.4 % MT LIQD: 1.0000 | OROMUCOSAL | Status: DC | PRN

## 2023-01-06 MED ORDER — BUPIVACAINE-EPINEPHRINE (PF) 0.5% -1:200000 IJ SOLN
INTRAMUSCULAR | Status: DC | PRN
  Administered 2023-01-06: 30 mL via PERINEURAL

## 2023-01-06 MED ORDER — GUAIFENESIN-DM 100-10 MG/5ML PO SYRP: 15.0000 mL | ORAL_SOLUTION | ORAL | Status: DC | PRN

## 2023-01-06 MED ORDER — LISINOPRIL 20 MG PO TABS
20.0000 mg | ORAL_TABLET | Freq: Every day | ORAL | Status: DC
  Administered 2023-01-06 – 2023-01-07 (×2): 20 mg via ORAL
  Filled 2023-01-06 (×2): qty 1

## 2023-01-06 MED ORDER — LACTATED RINGERS IV SOLN: INTRAVENOUS | Status: DC

## 2023-01-06 MED ORDER — PROPOFOL 10 MG/ML IV BOLUS
INTRAVENOUS | Status: AC
  Filled 2023-01-06: qty 20

## 2023-01-06 MED ORDER — ROPIVACAINE HCL 5 MG/ML IJ SOLN
INTRAMUSCULAR | Status: DC | PRN
  Administered 2023-01-06: 15 mL via PERINEURAL

## 2023-01-06 MED ORDER — ACETAMINOPHEN 325 MG PO TABS: 325.0000 mg | ORAL_TABLET | ORAL | Status: DC | PRN

## 2023-01-06 MED ORDER — HYDRALAZINE HCL 20 MG/ML IJ SOLN: 5.0000 mg | INTRAMUSCULAR | Status: DC | PRN

## 2023-01-06 MED ORDER — POLYETHYLENE GLYCOL 3350 17 G PO PACK: 17.0000 g | PACK | Freq: Every day | ORAL | Status: DC | PRN

## 2023-01-06 MED ORDER — INSULIN GLARGINE-YFGN 100 UNIT/ML ~~LOC~~ SOLN
24.0000 [IU] | Freq: Every day | SUBCUTANEOUS | Status: DC
  Administered 2023-01-06: 24 [IU] via SUBCUTANEOUS
  Filled 2023-01-06 (×2): qty 0.24

## 2023-01-06 MED ORDER — INSULIN ASPART 100 UNIT/ML IJ SOLN
0.0000 [IU] | INTRAMUSCULAR | Status: DC | PRN
  Administered 2023-01-06: 6 [IU] via SUBCUTANEOUS
  Filled 2023-01-06: qty 1

## 2023-01-06 MED ORDER — GLIPIZIDE ER 5 MG PO TB24
5.0000 mg | ORAL_TABLET | Freq: Every day | ORAL | Status: DC
  Administered 2023-01-06 – 2023-01-07 (×2): 5 mg via ORAL
  Filled 2023-01-06 (×2): qty 1

## 2023-01-06 MED ORDER — LABETALOL HCL 5 MG/ML IV SOLN: 10.0000 mg | INTRAVENOUS | Status: DC | PRN

## 2023-01-06 MED ORDER — INSULIN ASPART 100 UNIT/ML IJ SOLN
4.0000 [IU] | Freq: Three times a day (TID) | INTRAMUSCULAR | Status: DC
  Administered 2023-01-06 – 2023-01-07 (×4): 4 [IU] via SUBCUTANEOUS

## 2023-01-06 MED ORDER — JUVEN PO PACK
1.0000 | PACK | Freq: Two times a day (BID) | ORAL | Status: DC
  Filled 2023-01-06 (×3): qty 1

## 2023-01-06 MED ORDER — AMISULPRIDE (ANTIEMETIC) 5 MG/2ML IV SOLN: 10.0000 mg | Freq: Once | INTRAVENOUS | Status: DC | PRN

## 2023-01-06 MED ORDER — SODIUM CHLORIDE 0.9 % IV SOLN: INTRAVENOUS | Status: DC

## 2023-01-06 MED ORDER — HYDROCHLOROTHIAZIDE 12.5 MG PO TABS
12.5000 mg | ORAL_TABLET | Freq: Every day | ORAL | Status: DC
  Administered 2023-01-07: 12.5 mg via ORAL
  Filled 2023-01-06 (×2): qty 1

## 2023-01-06 MED ORDER — CEFAZOLIN SODIUM-DEXTROSE 2-4 GM/100ML-% IV SOLN
2.0000 g | Freq: Three times a day (TID) | INTRAVENOUS | Status: AC
  Administered 2023-01-06 (×2): 2 g via INTRAVENOUS
  Filled 2023-01-06 (×2): qty 100

## 2023-01-06 MED ORDER — METOPROLOL TARTRATE 5 MG/5ML IV SOLN: 2.0000 mg | INTRAVENOUS | Status: DC | PRN

## 2023-01-06 MED ORDER — ACETAMINOPHEN 10 MG/ML IV SOLN: 1000.0000 mg | Freq: Once | INTRAVENOUS | Status: DC | PRN

## 2023-01-06 MED ORDER — FENTANYL CITRATE (PF) 250 MCG/5ML IJ SOLN
INTRAMUSCULAR | Status: AC
  Filled 2023-01-06: qty 5

## 2023-01-06 MED ORDER — ACETAMINOPHEN 160 MG/5ML PO SOLN: 325.0000 mg | ORAL | Status: DC | PRN

## 2023-01-06 MED ORDER — OXYCODONE HCL 5 MG/5ML PO SOLN: 5.0000 mg | Freq: Once | ORAL | Status: DC | PRN

## 2023-01-06 MED ORDER — NICOTINE 21 MG/24HR TD PT24
21.0000 mg | MEDICATED_PATCH | Freq: Every day | TRANSDERMAL | Status: DC
  Administered 2023-01-06 – 2023-01-07 (×2): 21 mg via TRANSDERMAL
  Filled 2023-01-06 (×2): qty 1

## 2023-01-06 MED ORDER — MAGNESIUM SULFATE 2 GM/50ML IV SOLN: 2.0000 g | Freq: Every day | INTRAVENOUS | Status: DC | PRN

## 2023-01-06 MED ORDER — ONDANSETRON HCL 4 MG/2ML IJ SOLN
INTRAMUSCULAR | Status: AC
  Filled 2023-01-06: qty 2

## 2023-01-06 MED ORDER — BISACODYL 5 MG PO TBEC: 5.0000 mg | DELAYED_RELEASE_TABLET | Freq: Every day | ORAL | Status: DC | PRN

## 2023-01-06 MED ORDER — FENTANYL CITRATE (PF) 100 MCG/2ML IJ SOLN
INTRAMUSCULAR | Status: DC | PRN
  Administered 2023-01-06: 50 ug via INTRAVENOUS

## 2023-01-06 MED ORDER — MIDAZOLAM HCL 5 MG/5ML IJ SOLN
INTRAMUSCULAR | Status: DC | PRN
  Administered 2023-01-06: 2 mg via INTRAVENOUS

## 2023-01-06 MED ORDER — ALUM & MAG HYDROXIDE-SIMETH 200-200-20 MG/5ML PO SUSP: 15.0000 mL | ORAL | Status: DC | PRN

## 2023-01-06 MED ORDER — PANTOPRAZOLE SODIUM 40 MG PO TBEC
40.0000 mg | DELAYED_RELEASE_TABLET | Freq: Every day | ORAL | Status: DC
  Administered 2023-01-06 – 2023-01-07 (×2): 40 mg via ORAL
  Filled 2023-01-06 (×2): qty 1

## 2023-01-06 MED ORDER — PHENYLEPHRINE 80 MCG/ML (10ML) SYRINGE FOR IV PUSH (FOR BLOOD PRESSURE SUPPORT)
PREFILLED_SYRINGE | INTRAVENOUS | Status: AC
  Filled 2023-01-06: qty 10

## 2023-01-06 MED ORDER — INSULIN ASPART 100 UNIT/ML IJ SOLN
0.0000 [IU] | Freq: Three times a day (TID) | INTRAMUSCULAR | Status: DC
  Administered 2023-01-06 – 2023-01-07 (×3): 3 [IU] via SUBCUTANEOUS
  Administered 2023-01-07: 5 [IU] via SUBCUTANEOUS

## 2023-01-06 MED ORDER — ONDANSETRON HCL 4 MG/2ML IJ SOLN
INTRAMUSCULAR | Status: DC | PRN
  Administered 2023-01-06: 4 mg via INTRAVENOUS

## 2023-01-06 MED ORDER — OXYCODONE HCL 5 MG PO TABS: 5.0000 mg | ORAL_TABLET | Freq: Once | ORAL | Status: DC | PRN

## 2023-01-06 MED ORDER — DOCUSATE SODIUM 100 MG PO CAPS
100.0000 mg | ORAL_CAPSULE | Freq: Every day | ORAL | Status: DC
  Filled 2023-01-06: qty 1

## 2023-01-06 MED ORDER — POTASSIUM CHLORIDE CRYS ER 20 MEQ PO TBCR: 20.0000 meq | EXTENDED_RELEASE_TABLET | Freq: Every day | ORAL | Status: DC | PRN

## 2023-01-06 MED ORDER — VITAMIN C 500 MG PO TABS
1000.0000 mg | ORAL_TABLET | Freq: Every day | ORAL | Status: DC
  Administered 2023-01-06 – 2023-01-07 (×2): 1000 mg via ORAL
  Filled 2023-01-06 (×2): qty 2

## 2023-01-06 MED ORDER — AMPHETAMINE-DEXTROAMPHET ER 10 MG PO CP24
10.0000 mg | ORAL_CAPSULE | Freq: Every day | ORAL | Status: DC
  Administered 2023-01-06 – 2023-01-07 (×2): 10 mg via ORAL
  Filled 2023-01-06 (×2): qty 1

## 2023-01-06 MED ORDER — PROPOFOL 10 MG/ML IV BOLUS
INTRAVENOUS | Status: DC | PRN
  Administered 2023-01-06: 10 mg via INTRAVENOUS
  Administered 2023-01-06 (×2): 20 mg via INTRAVENOUS
  Administered 2023-01-06: 30 mg via INTRAVENOUS
  Administered 2023-01-06 (×3): 20 mg via INTRAVENOUS
  Administered 2023-01-06: 10 mg via INTRAVENOUS
  Administered 2023-01-06 (×3): 20 mg via INTRAVENOUS
  Administered 2023-01-06: 10 mg via INTRAVENOUS
  Administered 2023-01-06: 20 mg via INTRAVENOUS

## 2023-01-06 MED ORDER — OXYCODONE HCL 5 MG PO TABS
10.0000 mg | ORAL_TABLET | ORAL | Status: DC | PRN
  Administered 2023-01-07: 15 mg via ORAL
  Filled 2023-01-06: qty 3

## 2023-01-06 MED ORDER — ONDANSETRON HCL 4 MG/2ML IJ SOLN: 4.0000 mg | Freq: Four times a day (QID) | INTRAMUSCULAR | Status: DC | PRN

## 2023-01-06 MED ORDER — MAGNESIUM CITRATE PO SOLN: 1.0000 | Freq: Once | ORAL | Status: DC | PRN

## 2023-01-06 MED ORDER — LIDOCAINE 2% (20 MG/ML) 5 ML SYRINGE
INTRAMUSCULAR | Status: AC
  Filled 2023-01-06: qty 5

## 2023-01-06 MED ORDER — CHLORHEXIDINE GLUCONATE 0.12 % MT SOLN
15.0000 mL | OROMUCOSAL | Status: AC
  Administered 2023-01-06: 15 mL via OROMUCOSAL
  Filled 2023-01-06: qty 15

## 2023-01-06 MED ORDER — MIDAZOLAM HCL 2 MG/2ML IJ SOLN
INTRAMUSCULAR | Status: AC
  Filled 2023-01-06: qty 2

## 2023-01-06 MED ORDER — HYDROMORPHONE HCL 1 MG/ML IJ SOLN: 0.5000 mg | INTRAMUSCULAR | Status: DC | PRN

## 2023-01-06 MED ORDER — 0.9 % SODIUM CHLORIDE (POUR BTL) OPTIME
TOPICAL | Status: DC | PRN
  Administered 2023-01-06: 1000 mL

## 2023-01-06 MED ORDER — ACETAMINOPHEN 325 MG PO TABS: 325.0000 mg | ORAL_TABLET | Freq: Four times a day (QID) | ORAL | Status: DC | PRN

## 2023-01-06 MED ORDER — ZINC SULFATE 220 (50 ZN) MG PO CAPS
220.0000 mg | ORAL_CAPSULE | Freq: Every day | ORAL | Status: DC
  Administered 2023-01-06 – 2023-01-07 (×2): 220 mg via ORAL
  Filled 2023-01-06 (×2): qty 1

## 2023-01-06 MED ORDER — CEFAZOLIN IN SODIUM CHLORIDE 3-0.9 GM/100ML-% IV SOLN
3.0000 g | INTRAVENOUS | Status: AC
  Administered 2023-01-06: 3 g via INTRAVENOUS
  Filled 2023-01-06: qty 100

## 2023-01-06 MED ORDER — LISINOPRIL-HYDROCHLOROTHIAZIDE 20-12.5 MG PO TABS: 1.0000 | ORAL_TABLET | Freq: Every day | ORAL | Status: DC

## 2023-01-06 MED ORDER — PROMETHAZINE HCL 25 MG/ML IJ SOLN: 6.2500 mg | INTRAMUSCULAR | Status: DC | PRN

## 2023-01-06 MED ORDER — FENTANYL CITRATE (PF) 100 MCG/2ML IJ SOLN: 25.0000 ug | INTRAMUSCULAR | Status: DC | PRN

## 2023-01-06 MED ORDER — OXYCODONE HCL 5 MG PO TABS: 5.0000 mg | ORAL_TABLET | ORAL | Status: DC | PRN

## 2023-01-06 SURGICAL SUPPLY — 34 items
BAG COUNTER SPONGE SURGICOUNT (BAG) ×1 IMPLANT
BAG SPNG CNTER NS LX DISP (BAG) ×1
BLADE SAW RECIP 87.9 MT (BLADE) IMPLANT
BLADE SURG 21 STRL SS (BLADE) ×1 IMPLANT
CANISTER WOUND CARE 500ML ATS (WOUND CARE) ×1 IMPLANT
COVER SURGICAL LIGHT HANDLE (MISCELLANEOUS) ×1 IMPLANT
DRAPE EXTREMITY T 121X128X90 (DISPOSABLE) ×1 IMPLANT
DRAPE HALF SHEET 40X57 (DRAPES) ×1 IMPLANT
DRAPE INCISE IOBAN 66X45 STRL (DRAPES) ×1 IMPLANT
DRAPE U-SHAPE 47X51 STRL (DRAPES) ×2 IMPLANT
DRESSING PEEL AND PLC PRVNA 13 (GAUZE/BANDAGES/DRESSINGS) IMPLANT
DRESSING PREVENA PLUS CUSTOM (GAUZE/BANDAGES/DRESSINGS) ×1 IMPLANT
DRSG PEEL AND PLACE PREVENA 13 (GAUZE/BANDAGES/DRESSINGS) ×1
DRSG PREVENA PLUS CUSTOM (GAUZE/BANDAGES/DRESSINGS) ×1
DURAPREP 26ML APPLICATOR (WOUND CARE) ×1 IMPLANT
ELECT REM PT RETURN 9FT ADLT (ELECTROSURGICAL) ×1
ELECTRODE REM PT RTRN 9FT ADLT (ELECTROSURGICAL) ×1 IMPLANT
GLOVE BIOGEL PI IND STRL 9 (GLOVE) ×1 IMPLANT
GLOVE SURG ORTHO 9.0 STRL STRW (GLOVE) ×1 IMPLANT
GOWN STRL REUS W/ TWL XL LVL3 (GOWN DISPOSABLE) ×2 IMPLANT
GOWN STRL REUS W/TWL XL LVL3 (GOWN DISPOSABLE) ×2
GRAFT SKIN MARIGEN MICRO 38 (Tissue) IMPLANT
KIT BASIN OR (CUSTOM PROCEDURE TRAY) ×1 IMPLANT
KIT DRSG PREVENA PLUS 7DAY 125 (MISCELLANEOUS) IMPLANT
KIT TURNOVER KIT B (KITS) ×1 IMPLANT
MANIFOLD NEPTUNE II (INSTRUMENTS) ×1 IMPLANT
NS IRRIG 1000ML POUR BTL (IV SOLUTION) ×1 IMPLANT
PACK GENERAL/GYN (CUSTOM PROCEDURE TRAY) ×1 IMPLANT
PREVENA RESTOR ARTHOFORM 46X30 (CANNISTER) ×1 IMPLANT
STAPLER VISISTAT 35W (STAPLE) IMPLANT
SUT ETHILON 2 0 PSLX (SUTURE) ×2 IMPLANT
SUT SILK 2 0 (SUTURE)
SUT SILK 2-0 18XBRD TIE 12 (SUTURE) IMPLANT
TOWEL GREEN STERILE (TOWEL DISPOSABLE) ×1 IMPLANT

## 2023-01-06 NOTE — Progress Notes (Signed)
Orthopedic Tech Progress Note Patient Details:  Johnny Martinez 08-06-1971 [id number]  Ortho Devices Type of Ortho Device: Postop shoe/boot Ortho Device/Splint Location: LLE Ortho Device/Splint Interventions: Ordered, Application, Adjustment   Post Interventions Patient Tolerated: Well Instructions Provided: Adjustment of device, Care of device Post op shoe fitted and left in patients room. Patient seemed to be going in and out of it as I was talking to him he would fall asleep and then wake back up and fall asleep again. I notified the secretary so the RN would be aware. Vernona Rieger 01/06/2023, 12:56 PM

## 2023-01-06 NOTE — H&P (Signed)
Johnny Martinez is an 52 y.o. male.   Chief Complaint: Dehiscence left transmetatarsal amputation. HPI: Patient is a 52 year old gentleman status post a left transmetatarsal amputation.  Patient has had progressive breakdown with ulceration of the plantar aspect of the left transmetatarsal amputation he has failed conservative wound care.  Past Medical History:  Diagnosis Date   ADD (attention deficit disorder)    adult   Anxiety and depression    Arthritis    "probably a touch in my hands" (11/12/2017)   Cancer ([place]) 12/2022   skin cancer - right mid arm   COVID 10/03/2021   Diabetes mellitus without complication (HCC)    type 2   GERD (gastroesophageal reflux disease)    no meds, diet   Hypertension    Migraine    "maybe 2 in my lifetime" (11/12/2017)   MRSA infection (methicillin-resistant Staphylococcus aureus)    Neuropathy    lower legs/feet   OSA on CPAP     not using at this time   Restless legs     Past Surgical History:  Procedure Laterality Date   AMPUTATION Left 12/17/2017   Procedure: LEFT 2ND RAY AMPUTATION;  Surgeon: Newt Minion, MD;  Location: [facility];  Service: Orthopedics;  Laterality: Left;   AMPUTATION Left    foot x 2 at [place] Right 02/07/2021   Procedure: RIGHT FOOT 5TH RAY AMPUTATION;  Surgeon: Newt Minion, MD;  Location: [facility];  Service: Orthopedics;  Laterality: Right;   WISDOM TOOTH EXTRACTION      Family History  Problem Relation Age of Onset   Cancer Mother    Kidney failure Father    Breast cancer Sister    Social History:  reports that he has been smoking cigarettes. He has a 20.00 pack-year smoking history. He has never used smokeless tobacco. He reports that he does not currently use alcohol. He reports current drug use. Drug: Marijuana.  Allergies:  Allergies  Allergen Reactions   Iodine Anaphylaxis   Shellfish Allergy Anaphylaxis    Medications Prior to Admission  Medication Sig Dispense Refill    amphetamine-dextroamphetamine (ADDERALL XR) 10 MG 24 hr capsule Take 10 mg by mouth daily.     doxycycline (VIBRA-TABS) 100 MG tablet TAKE 1 TABLET BY MOUTH TWICE A DAY 60 tablet 0   glipiZIDE (GLUCOTROL XL) 5 MG 24 hr tablet Take 1 tablet (5 mg total) by mouth daily. 30 tablet 0   insulin glargine (LANTUS) 100 UNIT/ML injection Inject 24 Units into the skin at bedtime.     lisinopril-hydrochlorothiazide (PRINZIDE,ZESTORETIC) 20-12.5 MG per tablet Take 1 tablet by mouth daily.     tirzepatide [facility]) 2.5 MG/0.5ML Pen Inject 2.5 mg into the skin once a week. Takes on Wednesday     clindamycin (CLEOCIN) 300 MG capsule Take 300 mg by mouth 3 (three) times daily.     Desvenlafaxine Succinate ER 25 MG TB24 Take 25 mg by mouth daily after lunch.      Results for orders placed or performed during the hospital encounter of 01/06/23 (from the past 48 hour(s))  Glucose, capillary     Status: Abnormal   Collection Time: 01/06/23  6:30 AM  Result Value Ref Range   Glucose-Capillary 253 (H) 70 - 99 mg/dL    Comment: Glucose reference range applies only to samples taken after fasting for at least 8 hours.   No results found.  Review of Systems  All other systems reviewed and are negative.  Blood pressure (!) 154/102, pulse 75, temperature 98.4 F (36.9 C), temperature source Oral, resp. rate 20, height 6' (1.829 m), weight 129.3 kg, SpO2 97 %. Physical Exam  Examination patient is alert oriented no adenopathy well-dressed normal affect normal respiratory effort.  Patient has a 3 cm ulcer over the plantar aspect of the transmetatarsal amputation.  There is no ascending cellulitis no exposed bone no signs of infection. Assessment/Plan Assessment: Transmetatarsal amputation the left with dehiscence of the amputated stump.  Plan.  Will plan for revision of the transmetatarsal amputation on the left.  Risk and benefits were discussed including risk of the wound not healing need for additional surgery.   Patient states he understands wished to proceed at this time.  Newt Minion, MD 01/06/2023, 6:57 AM

## 2023-01-06 NOTE — Op Note (Signed)
01/06/2023  9:08 AM  PATIENT:  Johnny Martinez    PRE-OPERATIVE DIAGNOSIS:  Ulcer Left Transmetatarsal Amputation  POST-OPERATIVE DIAGNOSIS:  Same  PROCEDURE: [place] amputation left foot. Local tissue rearrangement for wound closure 10 x 5 cm. Application Kerecis micro graft 38 cm. Application of Prevena wound VAC 13 cm.   SURGEON:  Newt Minion, MD  PHYSICIAN ASSISTANT:None ANESTHESIA:   General  PREOPERATIVE INDICATIONS:  [name] is a  52 y.o. male with a diagnosis of Ulcer Left Transmetatarsal Amputation who failed conservative measures and elected for surgical management.    The risks benefits and alternatives were discussed with the patient preoperatively including but not limited to the risks of infection, bleeding, nerve injury, cardiopulmonary complications, the need for revision surgery, among others, and the patient was willing to proceed.  OPERATIVE IMPLANTS: Kerecis micro graft 38 cm.  '@ENCIMAGES'$ @  OPERATIVE FINDINGS: Tissue margins were clear.  OPERATIVE PROCEDURE: Patient brought the operating room underwent a regional anesthetic.  After adequate levels anesthesia were obtained patient's left lower extremity was prepped using ChloraPrep draped into a sterile field a timeout was called.  A fishmouth incision was made around the ulcerative tissue and the previous incision.  This left the wound that was 10 x 5 cm.  A reciprocating saw was used to perform a show part amputation.  Electrocautery was used hemostasis the wound margins were clear.  The wound was irrigated with normal saline.  The wound was filled with 38 cm of Kerecis micro graft to cover a wound surface area 50 cm.  Local tissue rearrangement was used to close the wound 10 x 5 cm.  The 13 cm Prevena wound VAC was applied this had a good suction fit patient was taken the PACU in stable condition.   DISCHARGE PLANNING:  Antibiotic duration: 24 hours antibiotics  Weightbearing: Touchdown  weightbearing on the left  Pain medication: Opioid pathway  Dressing care/ Wound VAC: Wound VAC  Ambulatory devices: Walker crutches or kneeling scooter  Discharge to: Anticipate discharge to home.  Follow-up: In the office 1 week post operative.

## 2023-01-06 NOTE — Anesthesia Procedure Notes (Signed)
Anesthesia Regional Block: Adductor canal block   Pre-Anesthetic Checklist: , timeout performed,  Correct Patient, Correct Site, Correct Laterality,  Correct Procedure, Correct Position, site marked,  Risks and benefits discussed,  Surgical consent,  Pre-op evaluation,  At surgeon's request and post-op pain management  Laterality: Left  Prep: chloraprep       Needles:  Injection technique: Single-shot  Needle Type: Echogenic Stimulator Needle     Needle Length: 9cm  Needle Gauge: 21     Additional Needles:   Procedures:,,,, ultrasound used (permanent image in chart),,    Narrative:  Start time: 01/06/2023 8:00 AM End time: 01/06/2023 8:05 AM Injection made incrementally with aspirations every 5 mL.  Performed by: Personally  Anesthesiologist: Effie Berkshire, MD  Additional Notes: Discussed risks and benefits of the nerve block in detail, including but not limited vascular injury, permanent nerve damage and infection.   Patient tolerated the procedure well. Local anesthetic introduced in an incremental fashion under minimal resistance after negative aspirations. No paresthesias were elicited. After completion of the procedure, no acute issues were identified and patient continued to be monitored by RN.

## 2023-01-06 NOTE — Progress Notes (Signed)
Inpatient Diabetes Program Recommendations  AACE/ADA: New Consensus Statement on Inpatient Glycemic Control (2015)  Target Ranges:  Prepandial:   less than 140 mg/dL      Peak postprandial:   less than 180 mg/dL (1-2 hours)      Critically ill patients:  140 - 180 mg/dL   Lab Results  Component Value Date   GLUCAP 187 (H) 01/06/2023   HGBA1C 6.9 (H) 01/01/2019    Review of Glycemic Control  Latest Reference Range & Units 01/06/23 06:30 01/06/23 09:18 01/06/23 10:37  Glucose-Capillary 70 - 99 mg/dL 253 (H) 203 (H) 187 (H)   Diabetes history: DM 2 Outpatient Diabetes medications:  Glucotrol XL 5 mg daily Lantus 24 units q HS Mounjaro 2.5 weekly Current orders for Inpatient glycemic control:  Novolog 0-15 units tid with meals Novolog 4 units tid with meals Glucotrol XL 5 mg daily Semglee 24 units q HS  Inpatient Diabetes Program Recommendations:    Referral received.  Agree with current orders.  A1C is pending.  Will follow.   Thanks,  Adah Perl, RN, BC-ADM Inpatient Diabetes Coordinator Pager [phone]  (8a-5p)

## 2023-01-06 NOTE — Progress Notes (Signed)
PT Cancellation Note  Patient Details Name: Johnny Martinez MRN: [id number] DOB: 1970/12/06   Cancelled Treatment:    Reason Eval/Treat Not Completed: Other (comment).  New transmet amp today, will start therapy tomorrow.   Ramond Dial 01/06/2023, 2:49 PM  Mee Hives, PT PhD Acute Rehab Dept. Number: [place]

## 2023-01-06 NOTE — Anesthesia Postprocedure Evaluation (Signed)
Anesthesia Post Note  Patient: Johnny Martinez  Procedure(s) Performed: REVISION LEFT TRANSMETATARSAL AMPUTATION (Left)     Patient location during evaluation: PACU Anesthesia Type: Regional Level of consciousness: awake and alert Pain management: pain level controlled Vital Signs Assessment: post-procedure vital signs reviewed and stable Respiratory status: spontaneous breathing, nonlabored ventilation, respiratory function stable and patient connected to nasal cannula oxygen Cardiovascular status: stable and blood pressure returned to baseline Postop Assessment: no apparent nausea or vomiting Anesthetic complications: no  No notable events documented.  Last Vitals:  Vitals:   01/06/23 1012 01/06/23 1040  BP:  (!) 126/91  Pulse: 75 73  Resp: 14 17  Temp: 36.4 C 36.6 C  SpO2: 93% 100%    Last Pain:  Vitals:   01/06/23 1040  TempSrc: Oral  PainSc:                  Effie Berkshire

## 2023-01-06 NOTE — Transfer of Care (Signed)
Immediate Anesthesia Transfer of Care Note  Patient: Johnny Martinez  Procedure(s) Performed: REVISION LEFT TRANSMETATARSAL AMPUTATION (Left)  Patient Location: PACU  Anesthesia Type:MAC combined with regional for post-op pain  Level of Consciousness: awake, alert , and oriented  Airway & Oxygen Therapy: Patient Spontanous Breathing  Post-op Assessment: Report given to RN and Post -op Vital signs reviewed and stable  Post vital signs: Reviewed and stable  Last Vitals:  Vitals Value Taken Time  BP 122/86 01/06/23 0915  Temp    Pulse 74 01/06/23 0920  Resp 14 01/06/23 0920  SpO2 96 % 01/06/23 0920  Vitals shown include unvalidated device data.  Last Pain:  Vitals:   01/06/23 0642  TempSrc:   PainSc: 0-No pain      Patients Stated Pain Goal: 0 ([id number])  Complications: No notable events documented.

## 2023-01-06 NOTE — Anesthesia Procedure Notes (Signed)
Anesthesia Regional Block: Popliteal block   Pre-Anesthetic Checklist: , timeout performed,  Correct Patient, Correct Site, Correct Laterality,  Correct Procedure, Correct Position, site marked,  Risks and benefits discussed,  Surgical consent,  Pre-op evaluation,  At surgeon's request and post-op pain management  Laterality: Left  Prep: chloraprep       Needles:  Injection technique: Single-shot  Needle Type: Echogenic Stimulator Needle     Needle Length: 9cm  Needle Gauge: 21     Additional Needles:   Procedures:,,,, ultrasound used (permanent image in chart),,    Narrative:  Start time: 01/06/2023 7:55 AM End time: 01/06/2023 8:00 AM Injection made incrementally with aspirations every 5 mL.  Performed by: Personally  Anesthesiologist: Effie Berkshire, MD  Additional Notes: Discussed risks and benefits of the nerve block in detail, including but not limited vascular injury, permanent nerve damage and infection.   Patient tolerated the procedure well. Local anesthetic introduced in an incremental fashion under minimal resistance after negative aspirations. No paresthesias were elicited. After completion of the procedure, no acute issues were identified and patient continued to be monitored by RN.

## 2023-01-06 NOTE — Anesthesia Preprocedure Evaluation (Addendum)
Anesthesia Evaluation  Patient identified by MRN, date of birth, ID band Patient awake    Reviewed: Allergy & Precautions, NPO status , Patient's Chart, lab work & pertinent test results  Airway Mallampati: III  TM Distance: >3 FB Neck ROM: Full    Dental  (+) Teeth Intact, Dental Advisory Given   Pulmonary sleep apnea , Current Smoker   breath sounds clear to auscultation       Cardiovascular hypertension, Pt. on medications  Rhythm:Regular Rate:Normal     Neuro/Psych  Headaches PSYCHIATRIC DISORDERS Anxiety Depression       GI/Hepatic Neg liver ROS,GERD  ,,  Endo/Other  diabetes, Type 2, Oral Hypoglycemic Agents, Insulin Dependent    Renal/GU negative Renal ROS     Musculoskeletal  (+) Arthritis ,    Abdominal Normal abdominal exam  (+)   Peds  Hematology negative hematology ROS (+)   Anesthesia Other Findings   Reproductive/Obstetrics                             Anesthesia Physical Anesthesia Plan  ASA: 3  Anesthesia Plan: Regional   Post-op Pain Management:    Induction: Intravenous  PONV Risk Score and Plan: 1 and Ondansetron, Propofol infusion and Midazolam  Airway Management Planned: Natural Airway and Simple Face Mask  Additional Equipment: None  Intra-op Plan:   Post-operative Plan:   Informed Consent: I have reviewed the patients History and Physical, chart, labs and discussed the procedure including the risks, benefits and alternatives for the proposed anesthesia with the patient or authorized representative who has indicated his/her understanding and acceptance.     Dental advisory given  Plan Discussed with: CRNA  Anesthesia Plan Comments:        Anesthesia Quick Evaluation

## 2023-01-07 ENCOUNTER — Other Ambulatory Visit: Payer: Self-pay | Admitting: Orthopedic Surgery

## 2023-01-07 ENCOUNTER — Telehealth: Payer: Self-pay | Admitting: Orthopedic Surgery

## 2023-01-07 ENCOUNTER — Encounter (HOSPITAL_COMMUNITY): Payer: Self-pay | Admitting: Orthopedic Surgery

## 2023-01-07 LAB — GLUCOSE, CAPILLARY
Glucose-Capillary: 163 mg/dL — ABNORMAL HIGH (ref 70–99)
Glucose-Capillary: 218 mg/dL — ABNORMAL HIGH (ref 70–99)

## 2023-01-07 LAB — HEMOGLOBIN A1C
Hgb A1c MFr Bld: 9.8 % — ABNORMAL HIGH (ref 4.8–5.6)
Mean Plasma Glucose: 235 mg/dL

## 2023-01-07 MED ORDER — OXYCODONE-ACETAMINOPHEN 5-325 MG PO TABS: 1.0000 | ORAL_TABLET | ORAL | 0 refills | Status: DC | PRN

## 2023-01-07 NOTE — Telephone Encounter (Signed)
I called and sw pt. Dr. Sharol Given reviewed the narcotic data base and the pt received hydrocodone cough medication 120 mls and that is what was flagging when the pharmacy called earlier. Please send in medication for post op pain

## 2023-01-07 NOTE — Telephone Encounter (Signed)
I called and sw Johnny Martinez and he states that Dr. Sharol Given wrote rx for Oxycodone but another prescriber wrote for hydrocodone yesterday and is currently getting prior auth for that. This pt is a revision of a trasnmet amp yesterday and per Dr. Sharol Given advised ok to cx his rx.

## 2023-01-07 NOTE — Telephone Encounter (Signed)
[facility] from this morning..[phone] Lies)

## 2023-01-07 NOTE — Discharge Summary (Signed)
Discharge Diagnoses:  Principal Problem:   History of transmetatarsal amputation of left foot ([place]) Active Problems:   Dehiscence of amputation stump of left lower extremity ([place])   Surgeries: Procedure(s): REVISION LEFT TRANSMETATARSAL AMPUTATION on 01/06/2023    Consultants:   Discharged Condition: Improved  Hospital Course: Johnny Martinez is an 52 y.o. male who was admitted 01/06/2023 with a chief complaint of dehiscence transmetatarsal amputation, with a final diagnosis of Ulcer Left Transmetatarsal Amputation.  Patient was brought to the operating room on 01/06/2023 and underwent Procedure(s): REVISION LEFT TRANSMETATARSAL AMPUTATION.    Patient was given perioperative antibiotics:  Anti-infectives (From admission, onward)    Start     Dose/Rate Route Frequency Ordered Stop   01/06/23 1600  ceFAZolin (ANCEF) IVPB 2g/100 mL premix        2 g 200 mL/hr over 30 Minutes Intravenous Every 8 hours 01/06/23 1039 01/07/23 0016   01/06/23 0630  ceFAZolin (ANCEF) IVPB 3g/100 mL premix        3 g 200 mL/hr over 30 Minutes Intravenous On call to O.R. 01/06/23 [id number] 01/06/23 0854     .  Patient was given sequential compression devices, early ambulation, and aspirin for DVT prophylaxis.  Recent vital signs: Patient Vitals for the past 24 hrs:  BP Temp Temp src Pulse Resp SpO2  01/07/23 1235 125/71 99.3 F (37.4 C) Oral 89 -- 97 %  01/07/23 1025 116/70 -- -- -- -- --  01/07/23 0846 111/68 99.2 F (37.3 C) Oral 91 16 96 %  01/07/23 0433 100/67 98 F (36.7 C) -- 85 18 96 %  01/06/23 1957 96/62 98.8 F (37.1 C) Oral 89 18 96 %  01/06/23 1616 111/78 99.8 F (37.7 C) Oral 90 17 97 %  .  Recent laboratory studies: No results found.  Discharge Medications:   Allergies as of 01/07/2023       Reactions   Iodine Anaphylaxis   Shellfish Allergy Anaphylaxis        Medication List     STOP taking these medications    HYDROcodone-acetaminophen 5-325 MG tablet Commonly known as:  NORCO/VICODIN       TAKE these medications    albuterol 108 (90 Base) MCG/ACT inhaler Commonly known as: VENTOLIN HFA INHALE TWO PUFFS INTO THE LUNGS EVERY 6 HOURS AS NEEDED FOR WHEEZING OR SHORTNESS OF BREATH.   ALKA-SELTZER ORIGINAL PO Take 1 packet by mouth as needed (reflux).   amphetamine-dextroamphetamine 10 MG tablet Commonly known as: ADDERALL Take 10 mg by mouth daily in the afternoon.   amphetamine-dextroamphetamine 10 MG 24 hr capsule Commonly known as: ADDERALL XR Take 10 mg by mouth daily.   desvenlafaxine 25 MG 24 hr tablet Commonly known as: PRISTIQ Take 25 mg by mouth daily after lunch.   doxycycline 100 MG tablet Commonly known as: VIBRA-TABS TAKE 1 TABLET BY MOUTH TWICE A DAY   glipiZIDE 5 MG 24 hr tablet Commonly known as: GLUCOTROL XL Take 1 tablet (5 mg total) by mouth daily.   insulin glargine 100 UNIT/ML injection Commonly known as: LANTUS Inject 24 Units into the skin at bedtime.   insulin glargine-yfgn 100 UNIT/ML Pen Commonly known as: SEMGLEE Inject 12 Units into the skin at bedtime.   lisinopril-hydrochlorothiazide 20-12.5 MG tablet Commonly known as: ZESTORETIC Take 1 tablet by mouth daily.   Mounjaro 2.5 MG/0.5ML Pen Generic drug: tirzepatide Inject 2.5 mg into the skin once a week. Takes on Wednesday   oxyCODONE-acetaminophen 5-325 MG tablet Commonly known as: PERCOCET/ROXICET  Take 1 tablet by mouth every 4 (four) hours as needed.   Ozempic (0.25 or 0.5 MG/DOSE) 2 MG/3ML Sopn Generic drug: Semaglutide(0.25 or 0.'5MG'$ /DOS) Inject 0.5 mg into the skin once a week.        Diagnostic Studies: No results found.  Patient benefited maximally from their hospital stay and there were no complications.     Disposition: Discharge disposition: 01-Home or Self Care      Discharge Instructions     Ambulatory Referral for Lung Cancer Scre   Complete by: As directed    Call MD / Call 911   Complete by: As directed    If you  experience chest pain or shortness of breath, CALL 911 and be transported to the hospital emergency room.  If you develope a fever above 101 F, pus (white drainage) or increased drainage or redness at the wound, or calf pain, call your surgeon's office.   Constipation Prevention   Complete by: As directed    Drink plenty of fluids.  Prune juice may be helpful.  You may use a stool softener, such as Colace (over the counter) 100 mg twice a day.  Use MiraLax (over the counter) for constipation as needed.   Diet - low sodium heart healthy   Complete by: As directed    Increase activity slowly as tolerated   Complete by: As directed    Negative Pressure Wound Therapy - Incisional   Complete by: As directed    Post-operative opioid taper instructions:   Complete by: As directed    POST-OPERATIVE OPIOID TAPER INSTRUCTIONS: It is important to wean off of your opioid medication as soon as possible. If you do not need pain medication after your surgery it is ok to stop day one. Opioids include: Codeine, Hydrocodone(Norco, Vicodin), Oxycodone(Percocet, oxycontin) and hydromorphone amongst others.  Long term and even short term use of opiods can cause: Increased pain response Dependence Constipation Depression Respiratory depression And more.  Withdrawal symptoms can include Flu like symptoms Nausea, vomiting And more Techniques to manage these symptoms Hydrate well Eat regular healthy meals Stay active Use relaxation techniques(deep breathing, meditating, yoga) Do Not substitute Alcohol to help with tapering If you have been on opioids for less than two weeks and do not have pain than it is ok to stop all together.  Plan to wean off of opioids This plan should start within one week post op of your joint replacement. Maintain the same interval or time between taking each dose and first decrease the dose.  Cut the total daily intake of opioids by one tablet each day Next start to increase  the time between doses. The last dose that should be eliminated is the evening dose.          Follow-up Information     Newt Minion, MD Follow up in 1 week(s).   Specialty: Orthopedic Surgery Contact information: [address] [place] [address] [phone]                  Signed: Newt Minion 01/07/2023, 1:05 PM

## 2023-01-07 NOTE — Evaluation (Signed)
Physical Therapy Evaluation Patient Details Name: Johnny Martinez MRN: [id number] DOB: [birth year] Today's Date: 01/07/2023  History of Present Illness  Patient is a 52 year old gentleman status post left transmetatarsal amputation admitted due to dehiscence and underwent [place] amputation 01/06/23.  PMH positive for GERD, MRSA, Neuropathy, OSA on CPAP, RLS, HTN, GERD and DM.  Clinical Impression  Patient presents with decreased mobility due to weight bearing restrictions post surgery.  Able to demonstrate transfers though with increased weight bearing on L LE, then placed leg on knee walker for hallway ambulation.  Negotiating 1 step for home entry with RW and minguard A cues again for weight bearing.  Patient stable for home with family support and no follow up PT at this time.        Recommendations for follow up therapy are one component of a multi-disciplinary discharge planning process, led by the attending physician.  Recommendations may be updated based on patient status, additional functional criteria and insurance authorization.  Follow Up Recommendations No PT follow up      Assistance Recommended at Discharge Intermittent Supervision/Assistance  Patient can return home with the following  Assist for transportation;Help with stairs or ramp for entrance;A little help with walking and/or transfers    Equipment Recommendations None recommended by PT  Recommendations for Other Services       Functional Status Assessment       Precautions / Restrictions Precautions Precautions: Fall Precaution Comments: wound vac Required Braces or Orthoses: Other Brace Other Brace: post op shoe Restrictions Weight Bearing Restrictions: Yes LLE Weight Bearing: Touchdown weight bearing      Mobility  Bed Mobility Overal bed mobility: Modified Independent                  Transfers Overall transfer level: Needs assistance   Transfers: Sit to/from Stand Sit to Stand:  Supervision           General transfer comment: cues for weight bearing precautions, and for safety, more than touch down to stand and then placed L foot on knee walker    Ambulation/Gait Ambulation/Gait assistance: Min guard, Supervision Gait Distance (Feet): 200 Feet Assistive device:  (knee walker) Gait Pattern/deviations: Step-to pattern       General Gait Details: minguard initially fading to S for safety  Stairs Stairs: Yes Stairs assistance: Min guard Stair Management: Forwards, With walker Number of Stairs: 1 General stair comments: demonstrated technique, then pt performed with minguard for safety, x 1 descenting with R first, then with cues repeated descending wtih L first  Wheelchair Mobility    Modified Rankin (Stroke Patients Only)       Balance Overall balance assessment: Needs assistance   Sitting balance-Leahy Scale: Good     Standing balance support: Bilateral upper extremity supported Standing balance-Leahy Scale: Poor Standing balance comment: for weight bearing restrictions                             Pertinent Vitals/Pain Pain Assessment Pain Assessment: 0-10 Pain Score: 7  Pain Location: L foot Pain Descriptors / Indicators: Aching Pain Intervention(s): Monitored during session    Home Living Family/patient expects to be discharged to:: Private residence Living Arrangements: Spouse/significant other;Children Available Help at Discharge: Family;Available 24 hours/day Type of Home: House Home Access: Stairs to enter   [employer] of Steps: 1   Home Layout: One level Home Equipment: Conservation officer, nature (2 wheels);Crutches;Other (comment) Additional Comments: knee scooter  Prior Function Prior Level of Function : Independent/Modified Independent               ADLs Comments: on disability     Hand Dominance   Dominant Hand: Right    Extremity/Trunk Assessment   Upper Extremity Assessment Upper  Extremity Assessment: Overall WFL for tasks assessed    Lower Extremity Assessment Lower Extremity Assessment: Overall WFL for tasks assessed       Communication   Communication: No difficulties  Cognition Arousal/Alertness: Awake/alert Behavior During Therapy: WFL for tasks assessed/performed Overall Cognitive Status: Within Functional Limits for tasks assessed                                          General Comments General comments (skin integrity, edema, etc.): noted wound vac with bloody drainage    Exercises     Assessment/Plan    PT Assessment Patient does not need any further PT services  PT Problem List         PT Treatment Interventions      PT Goals (Current goals can be found in the Care Plan section)  Acute Rehab PT Goals PT Goal Formulation: All assessment and education complete, DC therapy    Frequency       Co-evaluation               AM-PAC PT "6 Clicks" Mobility  Outcome Measure Help needed turning from your back to your side while in a flat bed without using bedrails?: None Help needed moving from lying on your back to sitting on the side of a flat bed without using bedrails?: None Help needed moving to and from a bed to a chair (including a wheelchair)?: A Little Help needed standing up from a chair using your arms (e.g., wheelchair or bedside chair)?: A Little Help needed to walk in hospital room?: A Little Help needed climbing 3-5 steps with a railing? : A Little 6 Click Score: 20    End of Session Equipment Utilized During Treatment: Gait belt Activity Tolerance: Patient tolerated treatment well Patient left: in bed;with call bell/phone within reach   PT Visit Diagnosis: Difficulty in walking, not elsewhere classified (R26.2)    Time: [id number] PT Time Calculation (min) (ACUTE ONLY): 23 min   Charges:   PT Evaluation $PT Eval Low Complexity: 1 Low PT Treatments $Gait Training: 8-22 mins        Magda Kiel, PT Acute Rehabilitation Services Office:[phone] 01/07/2023   Reginia Naas 01/07/2023, 11:06 AM

## 2023-01-07 NOTE — Discharge Summary (Signed)
Discharge Diagnoses:  Principal Problem:   History of transmetatarsal amputation of left foot ([place]) Active Problems:   Dehiscence of amputation stump of left lower extremity ([place])   Surgeries: Procedure(s): REVISION LEFT TRANSMETATARSAL AMPUTATION on 01/06/2023    Consultants:   Discharged Condition: Improved  Hospital Course: Johnny Martinez is an 52 y.o. male who was admitted 01/06/2023 with a chief complaint of dehiscence transmetatarsal amputation left foot, with a final diagnosis of Ulcer Left Transmetatarsal Amputation.  Patient was brought to the operating room on 01/06/2023 and underwent Procedure(s): REVISION LEFT TRANSMETATARSAL AMPUTATION.    Patient was given perioperative antibiotics:  Anti-infectives (From admission, onward)    Start     Dose/Rate Route Frequency Ordered Stop   01/06/23 1600  ceFAZolin (ANCEF) IVPB 2g/100 mL premix        2 g 200 mL/hr over 30 Minutes Intravenous Every 8 hours 01/06/23 1039 01/07/23 0016   01/06/23 0630  ceFAZolin (ANCEF) IVPB 3g/100 mL premix        3 g 200 mL/hr over 30 Minutes Intravenous On call to O.R. 01/06/23 [id number] 01/06/23 0854     .  Patient was given sequential compression devices, early ambulation, and aspirin for DVT prophylaxis.  Recent vital signs: Patient Vitals for the past 24 hrs:  BP Temp Temp src Pulse Resp SpO2  01/07/23 0433 100/67 98 F (36.7 C) -- 85 18 96 %  01/06/23 1957 96/62 98.8 F (37.1 C) Oral 89 18 96 %  01/06/23 1616 111/78 99.8 F (37.7 C) Oral 90 17 97 %  01/06/23 1040 (!) 126/91 97.8 F (36.6 C) Oral 73 17 100 %  01/06/23 1012 -- 97.6 F (36.4 C) -- 75 14 93 %  01/06/23 1011 121/78 -- -- 81 17 94 %  01/06/23 1009 -- -- -- 79 14 96 %  01/06/23 1000 128/76 -- -- 74 14 96 %  01/06/23 0945 124/89 -- -- 70 11 95 %  01/06/23 0930 124/85 -- -- 72 14 95 %  01/06/23 0915 122/86 97.8 F (36.6 C) -- 73 17 98 %  .  Recent laboratory studies: No results found.  Discharge Medications:   Allergies  as of 01/07/2023       Reactions   Iodine Anaphylaxis   Shellfish Allergy Anaphylaxis        Medication List     STOP taking these medications    HYDROcodone-acetaminophen 5-325 MG tablet Commonly known as: NORCO/VICODIN       TAKE these medications    albuterol 108 (90 Base) MCG/ACT inhaler Commonly known as: VENTOLIN HFA INHALE TWO PUFFS INTO THE LUNGS EVERY 6 HOURS AS NEEDED FOR WHEEZING OR SHORTNESS OF BREATH.   ALKA-SELTZER ORIGINAL PO Take 1 packet by mouth as needed (reflux).   amphetamine-dextroamphetamine 10 MG tablet Commonly known as: ADDERALL Take 10 mg by mouth daily in the afternoon.   amphetamine-dextroamphetamine 10 MG 24 hr capsule Commonly known as: ADDERALL XR Take 10 mg by mouth daily.   desvenlafaxine 25 MG 24 hr tablet Commonly known as: PRISTIQ Take 25 mg by mouth daily after lunch.   doxycycline 100 MG tablet Commonly known as: VIBRA-TABS TAKE 1 TABLET BY MOUTH TWICE A DAY   glipiZIDE 5 MG 24 hr tablet Commonly known as: GLUCOTROL XL Take 1 tablet (5 mg total) by mouth daily.   insulin glargine 100 UNIT/ML injection Commonly known as: LANTUS Inject 24 Units into the skin at bedtime.   insulin glargine-yfgn 100 UNIT/ML Pen Commonly known  as: SEMGLEE Inject 12 Units into the skin at bedtime.   lisinopril-hydrochlorothiazide 20-12.5 MG tablet Commonly known as: ZESTORETIC Take 1 tablet by mouth daily.   Mounjaro 2.5 MG/0.5ML Pen Generic drug: tirzepatide Inject 2.5 mg into the skin once a week. Takes on Wednesday   oxyCODONE-acetaminophen 5-325 MG tablet Commonly known as: PERCOCET/ROXICET Take 1 tablet by mouth every 4 (four) hours as needed.   Ozempic (0.25 or 0.5 MG/DOSE) 2 MG/3ML Sopn Generic drug: Semaglutide(0.25 or 0.'5MG'$ /DOS) Inject 0.5 mg into the skin once a week.        Diagnostic Studies: No results found.  Patient benefited maximally from their hospital stay and there were no complications.      Disposition:  Discharge Instructions     Ambulatory Referral for Lung Cancer Scre   Complete by: As directed    Negative Pressure Wound Therapy - Incisional   Complete by: As directed        Follow-up Information     Newt Minion, MD Follow up in 1 week(s).   Specialty: Orthopedic Surgery Contact information: [address] [place] [address] [phone]                  Signed: Newt Minion 01/07/2023, 8:02 AM

## 2023-01-07 NOTE — Progress Notes (Signed)
Patient ID: Johnny Martinez, male   DOB: [birth year], 52 y.o.   MRN: [id number] Patient is postoperative day 1 revision transmetatarsal amputation he feels stable and ready for discharge to home.  Patient will need a new canister for the Praveena plus wound VAC pump.

## 2023-01-07 NOTE — Telephone Encounter (Signed)
Patient called advised his wife went by the pharmacy and there was no pain medication called in for him. Patient asked if the pain medicine can be sent as soon as possible. The number to contact  patient is [phone]       [facility] [address] [facility] [place]

## 2023-01-11 ENCOUNTER — Telehealth: Payer: Self-pay

## 2023-01-11 ENCOUNTER — Other Ambulatory Visit: Payer: Self-pay | Admitting: Orthopedic Surgery

## 2023-01-11 LAB — AEROBIC/ANAEROBIC CULTURE W GRAM STAIN (SURGICAL/DEEP WOUND): Gram Stain: NONE SEEN

## 2023-01-11 MED ORDER — AMOXICILLIN-POT CLAVULANATE 875-125 MG PO TABS: 1.0000 | ORAL_TABLET | Freq: Two times a day (BID) | ORAL | 0 refills | Status: AC

## 2023-01-11 NOTE — Telephone Encounter (Signed)
Called pt and lm on vm to call back. I will hold and try again to advise of new abx sent to pharmacy today due to bacteria in surgical cultures.

## 2023-01-11 NOTE — Telephone Encounter (Signed)
-----   Message from Newt Minion, MD sent at 01/11/2023  8:24 AM EDT ----- Prescription sent in for Augmentin.  Patient had multiple organisms sensitive to ampicillin.  Tissue margins were clear. ----- Message ----- From: Buel Ream, Lab In [place] Sent: 01/08/2023   4:40 PM EDT To: Newt Minion, MD

## 2023-01-12 NOTE — Telephone Encounter (Signed)
I called and lm on vm to advise pt to call the office I need to speak with him about medication.

## 2023-01-13 ENCOUNTER — Telehealth: Payer: Self-pay | Admitting: Family

## 2023-01-13 ENCOUNTER — Ambulatory Visit (INDEPENDENT_AMBULATORY_CARE_PROVIDER_SITE_OTHER): Payer: Medicare HMO | Admitting: Family

## 2023-01-13 ENCOUNTER — Encounter: Payer: Self-pay | Admitting: Family

## 2023-01-13 DIAGNOSIS — Z89432 Acquired absence of left foot: Secondary | ICD-10-CM

## 2023-01-13 MED ORDER — OXYCODONE-ACETAMINOPHEN 5-325 MG PO TABS: 1.0000 | ORAL_TABLET | ORAL | 0 refills | Status: AC | PRN

## 2023-01-13 NOTE — Telephone Encounter (Signed)
Will let Erin know.

## 2023-01-13 NOTE — Progress Notes (Signed)
Post-Op Visit Note   Patient: Johnny Martinez           Date of Birth: 13-Jun-1971           MRN: [id number] Visit Date: 01/13/2023 PCP: Curly Rim, MD  Chief Complaint:  Chief Complaint  Patient presents with   Left Foot - Routine Post Op    01/06/23 revision left transmet amputation    HPI:  HPI The patient is a 52 year old gentleman seen status post revision left transmetatarsal amputation wound VAC removed today. Ortho Exam On examination of the left foot there is some plantar maceration the incisions well-approximated with sutures there is no gaping no active drainage  Visit Diagnoses: No diagnosis found.  Plan: Begin daily Dial soap cleansing.  Dry dressings.  Nonweightbearing.  Continue knee scooter.  Follow-Up Instructions: No follow-ups on file.   Imaging: No results found.  Orders:  No orders of the defined types were placed in this encounter.  No orders of the defined types were placed in this encounter.    PMFS History: Patient Active Problem List   Diagnosis Date Noted   Dehiscence of amputation stump of left lower extremity ([place]) 01/06/2023   History of transmetatarsal amputation of left foot ([place]) 01/06/2023   Ulcer of right foot, with necrosis of bone ([place])    Cellulitis of right lower extremity 01/01/2019   Diabetic neurogenic arthropathy ([place]) 03/14/2018   Ulcer of toe of left foot, limited to breakdown of skin ([place]) 03/14/2018   ADD (attention deficit disorder) 11/13/2017   Anxiety 11/13/2017   HTN (hypertension) 11/13/2017   Pre-diabetes 11/13/2017   Osteomyelitis of fifth toe of right foot (HCC)    Sepsis ([place]) 11/11/2017   Severe sepsis ([place]) 11/11/2017   Peripheral neuropathy 04/14/2013   Past Medical History:  Diagnosis Date   ADD (attention deficit disorder)    adult   Anxiety and depression    Arthritis    "probably a touch in my hands" (11/12/2017)   Cancer ([place]) 12/2022   skin cancer - right mid arm   COVID 10/03/2021    Diabetes mellitus without complication (HCC)    type 2   GERD (gastroesophageal reflux disease)    no meds, diet   Hypertension    Migraine    "maybe 2 in my lifetime" (11/12/2017)   MRSA infection (methicillin-resistant Staphylococcus aureus)    Neuropathy    lower legs/feet   OSA on CPAP     not using at this time   Restless legs     Family History  Problem Relation Age of Onset   Cancer Mother    Kidney failure Father    Breast cancer Sister     Past Surgical History:  Procedure Laterality Date   AMPUTATION Left 12/17/2017   Procedure: LEFT 2ND RAY AMPUTATION;  Surgeon: Newt Minion, MD;  Location: [facility];  Service: Orthopedics;  Laterality: Left;   AMPUTATION Left    foot x 2 at [facility] Right 02/07/2021   Procedure: RIGHT FOOT 5TH RAY AMPUTATION;  Surgeon: Newt Minion, MD;  Location: [facility];  Service: Orthopedics;  Laterality: Right;   STUMP REVISION Left 01/06/2023   Procedure: REVISION LEFT TRANSMETATARSAL AMPUTATION;  Surgeon: Newt Minion, MD;  Location: [facility];  Service: Orthopedics;  Laterality: Left;   WISDOM TOOTH EXTRACTION     Social History   Occupational History    Comment: [employer]  Tobacco Use   Smoking status: Every Day  Packs/day: 0.50    Years: 40.00    Total pack years: 20.00    Types: Cigarettes   Smokeless tobacco: Never  Vaping Use   Vaping Use: Never used  Substance and Sexual Activity   Alcohol use: Not Currently    Comment: Hx 3 vodka tonics daily, cutting back to 1/day   Drug use: Yes    Types: Marijuana    Comment: Last use 2 weeks ago   Sexual activity: Yes

## 2023-01-13 NOTE — Telephone Encounter (Signed)
Pt has an appt in the office today and will advise of abx at that time. Have been unsuccessful trying to reach pt by phone have left several messages and no return call.

## 2023-01-13 NOTE — Addendum Note (Signed)
Addended by: Dondra Prader R on: 01/13/2023 03:29 PM   Modules accepted: Orders

## 2023-01-13 NOTE — Telephone Encounter (Signed)
Pt advised while in office for appt today

## 2023-01-13 NOTE — Telephone Encounter (Signed)
Patient states he picked up antibiotics but states his pain medication had not been call ed. Please advise

## 2023-01-14 NOTE — Telephone Encounter (Signed)
Erin sent in Rx yesterday

## 2023-02-02 ENCOUNTER — Ambulatory Visit (INDEPENDENT_AMBULATORY_CARE_PROVIDER_SITE_OTHER): Payer: Medicare HMO | Admitting: Orthopedic Surgery

## 2023-02-02 DIAGNOSIS — T8781 Dehiscence of amputation stump: Secondary | ICD-10-CM

## 2023-02-07 ENCOUNTER — Encounter: Payer: Self-pay | Admitting: Orthopedic Surgery

## 2023-02-07 NOTE — Progress Notes (Signed)
Office Visit Note   Patient: Johnny Martinez           Date of Birth: Oct 31, 1971           MRN: [id number] Visit Date: 02/02/2023              Requested by: Vivien Presto, MD [phone] [address] [place],  [place] [address] PCP: Vivien Presto, MD  Chief Complaint  Patient presents with   Left Foot - Routine Post Op    01/06/23 revision left transmet amputation      HPI: Patient is a 52 year old gentleman who is 4 weeks status post revision left transmetatarsal amputation.  Assessment & Plan: Visit Diagnoses:  1. Dehiscence of amputation stump of left lower extremity     Plan: Continue with the kneeling scooter.  Patient was given a stump shrinker to use to help decrease swelling.  Remove sutures at follow-up.  Follow-Up Instructions: Return in about 1 week (around 02/09/2023).   Ortho Exam  Patient is alert, oriented, no adenopathy, well-dressed, normal affect, normal respiratory effort. Examination there is increased swelling with venous insufficiency.  The wound edges are well-approximated.  Imaging: No results found.   Labs: Lab Results  Component Value Date   HGBA1C 9.8 (H) 01/06/2023   HGBA1C 6.9 (H) 01/01/2019   HGBA1C 6.6 (H) 12/17/2017   ESRSEDRATE 9 04/14/2013   CRP 2.4 04/14/2013   REPTSTATUS 01/11/2023 FINAL 01/06/2023   GRAMSTAIN NO WBC SEEN NO ORGANISMS SEEN  01/06/2023   CULT  01/06/2023    FEW STREPTOCOCCUS GROUP G Beta hemolytic streptococci are predictably susceptible to penicillin and other beta lactams. Susceptibility testing not routinely performed. AMONG MIXED ORGANISMS NO ANAEROBES ISOLATED Performed at [facility], [address] [place], [place] [address]      Lab Results  Component Value Date   ALBUMIN 3.5 11/11/2017    Lab Results  Component Value Date   MG 1.6 (L) 11/12/2017   No results found for: "VD25OH"  No results found for: "PREALBUMIN"    Latest Ref Rng & Units 01/06/2023    6:37 AM 01/02/2019     5:10 AM 01/01/2019    7:02 PM  CBC EXTENDED  WBC 4.0 - 10.5 K/uL 11.7  8.1  8.2   RBC 4.22 - 5.81 MIL/uL 4.99  4.05  4.23   Hemoglobin 13.0 - 17.0 g/dL 38.2  50.5  39.7   HCT 39.0 - 52.0 % 46.6  40.4  41.6   Platelets 150 - 400 K/uL 384  231  234   NEUT# 1.7 - 7.7 K/uL   5.6   Lymph# 0.7 - 4.0 K/uL   1.3      There is no height or weight on file to calculate BMI.  Orders:  No orders of the defined types were placed in this encounter.  No orders of the defined types were placed in this encounter.    Procedures: No procedures performed  Clinical Data: No additional findings.  ROS:  All other systems negative, except as noted in the HPI. Review of Systems  Objective: Vital Signs: There were no vitals taken for this visit.  Specialty Comments:  No specialty comments available.  PMFS History: Patient Active Problem List   Diagnosis Date Noted   Dehiscence of amputation stump of left lower extremity 01/06/2023   History of transmetatarsal amputation of left foot 01/06/2023   Ulcer of right foot, with necrosis of bone    Cellulitis  of right lower extremity 01/01/2019   Diabetic neurogenic arthropathy 03/14/2018   Ulcer of toe of left foot, limited to breakdown of skin 03/14/2018   ADD (attention deficit disorder) 11/13/2017   Anxiety 11/13/2017   HTN (hypertension) 11/13/2017   Pre-diabetes 11/13/2017   Osteomyelitis of fifth toe of right foot    Sepsis 11/11/2017   Severe sepsis 11/11/2017   Peripheral neuropathy 04/14/2013   Past Medical History:  Diagnosis Date   ADD (attention deficit disorder)    adult   Anxiety and depression    Arthritis    "probably a touch in my hands" (11/12/2017)   Cancer 12/2022   skin cancer - right mid arm   COVID 10/03/2021   Diabetes mellitus without complication    type 2   GERD (gastroesophageal reflux disease)    no meds, diet   Hypertension    Migraine    "maybe 2 in my lifetime" (11/12/2017)   MRSA infection  (methicillin-resistant Staphylococcus aureus)    Neuropathy    lower legs/feet   OSA on CPAP     not using at this time   Restless legs     Family History  Problem Relation Age of Onset   Cancer Mother    Kidney failure Father    Breast cancer Sister     Past Surgical History:  Procedure Laterality Date   AMPUTATION Left 12/17/2017   Procedure: LEFT 2ND RAY AMPUTATION;  Surgeon: Nadara Mustard, MD;  Location[facility];  Service: Orthopedics;  Laterality: Left;   AMPUTATION Left    foot x 2 at [facility]   AMPUTATION Right 02/07/2021   Procedure: RIGHT FOOT 5TH RAY AMPUTATION;  Surgeon: Nadara Mustard, MD;  Location: [facility] OR;  Service: Orthopedics;  Laterality: Right;   STUMP REVISION Left 01/06/2023   Procedure: REVISION LEFT TRANSMETATARSAL AMPUTATION;  Surgeon: Nadara Mustard, MD;  Location: [facility] OR;  Service: Orthopedics;  Laterality: Left;   WISDOM TOOTH EXTRACTION     Social History   Occupational History    Comment: [employer]  Tobacco Use   Smoking status: Every Day    Packs/day: 0.50    Years: 40.00    Additional pack years: 0.00    Total pack years: 20.00    Types: Cigarettes   Smokeless tobacco: Never  Vaping Use   Vaping Use: Never used  Substance and Sexual Activity   Alcohol use: Not Currently    Comment: Hx 3 vodka tonics daily, cutting back to 1/day   Drug use: Yes    Types: Marijuana    Comment: Last use 2 weeks ago   Sexual activity: Yes

## 2023-02-09 ENCOUNTER — Ambulatory Visit (INDEPENDENT_AMBULATORY_CARE_PROVIDER_SITE_OTHER): Payer: Medicare HMO | Admitting: Family

## 2023-02-09 DIAGNOSIS — Z89432 Acquired absence of left foot: Secondary | ICD-10-CM

## 2023-02-10 ENCOUNTER — Encounter: Payer: Self-pay | Admitting: Family

## 2023-02-10 NOTE — Progress Notes (Signed)
Post-Op Visit Note   Patient: Johnny Martinez           Date of Birth: 1971/03/02           MRN: [id number] Visit Date: 02/09/2023 PCP: Vivien Presto, MD  Chief Complaint:  Chief Complaint  Patient presents with   Left Foot - Routine Post Op    01/06/23 revision left transmet amputation    HPI:  HPI The patient is a 52 year old gentleman seen status post revision left transmetatarsal amputation he has been wearing a stump shrinker using a knee scooter. Ortho Exam [place] the left transmetatarsal amputation centrally there is 1 area that has not yet fully healed this is filled in with some epiboly and maceration this probes 5 mm deep does not probe to bone sutures harvested today no active drainage or erythema  Visit Diagnoses: No diagnosis found.  Plan: Continue daily dose of cleansing dry dressings minimize weightbearing  Follow-Up Instructions: No follow-ups on file.   Imaging: No results found.  Orders:  No orders of the defined types were placed in this encounter.  No orders of the defined types were placed in this encounter.    PMFS History: Patient Active Problem List   Diagnosis Date Noted   Dehiscence of amputation stump of left lower extremity 01/06/2023   History of transmetatarsal amputation of left foot 01/06/2023   Ulcer of right foot, with necrosis of bone    Cellulitis of right lower extremity 01/01/2019   Diabetic neurogenic arthropathy 03/14/2018   Ulcer of toe of left foot, limited to breakdown of skin 03/14/2018   ADD (attention deficit disorder) 11/13/2017   Anxiety 11/13/2017   HTN (hypertension) 11/13/2017   Pre-diabetes 11/13/2017   Osteomyelitis of fifth toe of right foot    Sepsis 11/11/2017   Severe sepsis 11/11/2017   Peripheral neuropathy 04/14/2013   Past Medical History:  Diagnosis Date   ADD (attention deficit disorder)    adult   Anxiety and depression    Arthritis    "probably a touch in my hands" (11/12/2017)    Cancer 12/2022   skin cancer - right mid arm   COVID 10/03/2021   Diabetes mellitus without complication    type 2   GERD (gastroesophageal reflux disease)    no meds, diet   Hypertension    Migraine    "maybe 2 in my lifetime" (11/12/2017)   MRSA infection (methicillin-resistant Staphylococcus aureus)    Neuropathy    lower legs/feet   OSA on CPAP     not using at this time   Restless legs     Family History  Problem Relation Age of Onset   Cancer Mother    Kidney failure Father    Breast cancer Sister     Past Surgical History:  Procedure Laterality Date   AMPUTATION Left 12/17/2017   Procedure: LEFT 2ND RAY AMPUTATION;  Surgeon: Nadara Mustard, MD;  Location[facility];  Service: Orthopedics;  Laterality: Left;   AMPUTATION Left    foot x 2 at [facility]   AMPUTATION Right 02/07/2021   Procedure: RIGHT FOOT 5TH RAY AMPUTATION;  Surgeon: Nadara Mustard, MD;  Location: [facility];  Service: Orthopedics;  Laterality: Right;   STUMP REVISION Left 01/06/2023   Procedure: REVISION LEFT TRANSMETATARSAL AMPUTATION;  Surgeon: Nadara Mustard, MD;  Location: [facility] OR;  Service: Orthopedics;  Laterality: Left;   WISDOM TOOTH EXTRACTION     Social History   Occupational History  Comment: [employer]  Tobacco Use   Smoking status: Every Day    Packs/day: 0.50    Years: 40.00    Additional pack years: 0.00    Total pack years: 20.00    Types: Cigarettes   Smokeless tobacco: Never  Vaping Use   Vaping Use: Never used  Substance and Sexual Activity   Alcohol use: Not Currently    Comment: Hx 3 vodka tonics daily, cutting back to 1/day   Drug use: Yes    Types: Marijuana    Comment: Last use 2 weeks ago   Sexual activity: Yes

## 2023-02-23 ENCOUNTER — Encounter: Payer: Self-pay | Admitting: Family

## 2023-02-23 ENCOUNTER — Ambulatory Visit (INDEPENDENT_AMBULATORY_CARE_PROVIDER_SITE_OTHER): Payer: Medicare HMO | Admitting: Family

## 2023-02-23 DIAGNOSIS — Z89432 Acquired absence of left foot: Secondary | ICD-10-CM

## 2023-02-23 NOTE — Progress Notes (Signed)
Post-Op Visit Note   Patient: Johnny Martinez           Date of Birth: 02/01/1971           MRN:REVIS WHALINisit Date: 02/23/2023 PCP: Vivien Presto, MD  Chief Complaint:  Chief Complaint  Patient presents with   Left Foot - Routine Post Op    01/06/23 revision left transmet amputation    HPI:  HPI The patient is a 52 year old gentleman seen status post revision left transmetatarsal amputation Ortho Exam On examination of the left foot his incision is nearly healed there is 1 remaining open area centrally with dried serous drainage this is 5 mm in diameter there is no surrounding erythema warmth or signs of infection  Visit Diagnoses: No diagnosis found.  Plan: He will continue with his current wound care and weightbearing.  He will follow-up once more in 4 weeks.  Hope to release him at that time  Follow-Up Instructions: No follow-ups on file.   Imaging: No results found.  Orders:  No orders of the defined types were placed in this encounter.  No orders of the defined types were placed in this encounter.    PMFS History: Patient Active Problem List   Diagnosis Date Noted   Dehiscence of amputation stump of left lower extremity 01/06/2023   History of transmetatarsal amputation of left foot 01/06/2023   Ulcer of right foot, with necrosis of bone    Cellulitis of right lower extremity 01/01/2019   Diabetic neurogenic arthropathy 03/14/2018   Ulcer of toe of left foot, limited to breakdown of skin 03/14/2018   ADD (attention deficit disorder) 11/13/2017   Anxiety 11/13/2017   HTN (hypertension) 11/13/2017   Pre-diabetes 11/13/2017   Osteomyelitis of fifth toe of right foot    Sepsis 11/11/2017   Severe sepsis 11/11/2017   Peripheral neuropathy 04/14/2013   Past Medical History:  Diagnosis Date   ADD (attention deficit disorder)    adult   Anxiety and depression    Arthritis    "probably a touch in my hands" (11/12/2017)   Cancer 12/2022   skin cancer -  right mid arm   COVID 10/03/2021   Diabetes mellitus without complication    type 2   GERD (gastroesophageal reflux disease)    no meds, diet   Hypertension    Migraine    "maybe 2 in my lifetime" (11/12/2017)   MRSA infection (methicillin-resistant Staphylococcus aureus)    Neuropathy    lower legs/feet   OSA on CPAP     not using at this time   Restless legs     Family History  Problem Relation Age of Onset   Cancer Mother    Kidney failure Father    Breast cancer Sister     Past Surgical History:  Procedure Laterality Date   AMPUTATION Left 12/17/2017   Procedure: LEFT 2ND RAY AMPUTATION;  Surgeon: Nadara Mustard, MD;  Location: [facility];  Service: Orthopedics;  Laterality: Left;   AMPUTATION Left    foot x 2 at [facility]   AMPUTATION Right 02/07/2021   Procedure: RIGHT FOOT 5TH RAY AMPUTATION;  Surgeon: Nadara Mustard, MD;  Location: [facility] OR;  Service: Orthopedics;  Laterality: Right;   STUMP REVISION Left 01/06/2023   Procedure: REVISION LEFT TRANSMETATARSAL AMPUTATION;  Surgeon: Nadara Mustard, MD;  Location: [facility] OR;  Service: Orthopedics;  Laterality: Left;   WISDOM TOOTH EXTRACTION     Social History   Occupational History  Comment: [employer]  Tobacco Use   Smoking status: Every Day    Packs/day: 0.50    Years: 40.00    Additional pack years: 0.00    Total pack years: 20.00    Types: Cigarettes   Smokeless tobacco: Never  Vaping Use   Vaping Use: Never used  Substance and Sexual Activity   Alcohol use: Not Currently    Comment: Hx 3 vodka tonics daily, cutting back to 1/day   Drug use: Yes    Types: Marijuana    Comment: Last use 2 weeks ago   Sexual activity: Yes

## 2023-03-30 ENCOUNTER — Encounter: Payer: Self-pay | Admitting: Family

## 2023-03-30 ENCOUNTER — Ambulatory Visit (INDEPENDENT_AMBULATORY_CARE_PROVIDER_SITE_OTHER): Payer: Medicare HMO | Admitting: Family

## 2023-03-30 DIAGNOSIS — T8781 Dehiscence of amputation stump: Secondary | ICD-10-CM

## 2023-03-30 DIAGNOSIS — Z89432 Acquired absence of left foot: Secondary | ICD-10-CM

## 2023-03-30 MED ORDER — PREDNISONE 50 MG PO TABS: ORAL_TABLET | ORAL | 0 refills | Status: AC

## 2023-03-30 NOTE — Progress Notes (Signed)
Post-Op Visit Note   Patient: Johnny Martinez           Date of Birth: 01-12-1971           MRN: [id number] Visit Date: 03/30/2023 PCP: Vivien Presto, MD  Chief Complaint:  Chief Complaint  Patient presents with   Left Foot - Routine Post Op    01/06/2023 revision left transmet amputation     HPI:  HPI The patient is a 52 year old gentleman seen status post revision left transmetatarsal amputation.  No concerns of the incision today he does report when he is seated in a car or in the recliner or bed that he has shooting pain along the lateral aspect of his left foot  No numbness no tingling no back pain or weakness  History of sciatica  Ortho Exam On examination of left foot the transmetatarsal amputation is well healed. Was debrided with a 10 blade knife of hyperkeratotic tissue. No erythema warmth or drainage.  Visit Diagnoses: No diagnosis found.  Plan: activities as tolerated. Pred sent for radicular pain. Follow up as needed.  Follow-Up Instructions: Return if symptoms worsen or fail to improve.   Imaging: No results found.  Orders:  No orders of the defined types were placed in this encounter.  No orders of the defined types were placed in this encounter.    PMFS History: Patient Active Problem List   Diagnosis Date Noted   Dehiscence of amputation stump of left lower extremity (HCC) 01/06/2023   History of transmetatarsal amputation of left foot (HCC) 01/06/2023   Ulcer of right foot, with necrosis of bone (HCC)    Cellulitis of right lower extremity 01/01/2019   Diabetic neurogenic arthropathy (HCC) 03/14/2018   Ulcer of toe of left foot, limited to breakdown of skin (HCC) 03/14/2018   ADD (attention deficit disorder) 11/13/2017   Anxiety 11/13/2017   HTN (hypertension) 11/13/2017   Pre-diabetes 11/13/2017   Osteomyelitis of fifth toe of right foot (HCC)    Sepsis (HCC) 11/11/2017   Severe sepsis (HCC) 11/11/2017   Peripheral neuropathy 04/14/2013    Past Medical History:  Diagnosis Date   ADD (attention deficit disorder)    adult   Anxiety and depression    Arthritis    "probably a touch in my hands" (11/12/2017)   Cancer (HCC) 12/2022   skin cancer - right mid arm   COVID 10/03/2021   Diabetes mellitus without complication (HCC)    type 2   GERD (gastroesophageal reflux disease)    no meds, diet   Hypertension    Migraine    "maybe 2 in my lifetime" (11/12/2017)   MRSA infection (methicillin-resistant Staphylococcus aureus)    Neuropathy    lower legs/feet   OSA on CPAP     not using at this time   Restless legs     Family History  Problem Relation Age of Onset   Cancer Mother    Kidney failure Father    Breast cancer Sister     Past Surgical History:  Procedure Laterality Date   AMPUTATION Left 12/17/2017   Procedure: LEFT 2ND RAY AMPUTATION;  Surgeon: Nadara Mustard, MD;  Location: [facility];  Service: Orthopedics;  Laterality: Left;   AMPUTATION Left    foot x 2 at [facility]   AMPUTATION Right 02/07/2021   Procedure: RIGHT FOOT 5TH RAY AMPUTATION;  Surgeon: Nadara Mustard, MD;  Location: [facility] OR;  Service: Orthopedics;  Laterality: Right;   STUMP REVISION Left  01/06/2023   Procedure: REVISION LEFT TRANSMETATARSAL AMPUTATION;  Surgeon: Nadara Mustard, MD;  Location: [facility] OR;  Service: Orthopedics;  Laterality: Left;   WISDOM TOOTH EXTRACTION     Social History   Occupational History    Comment: [employer]  Tobacco Use   Smoking status: Every Day    Packs/day: 0.50    Years: 40.00    Additional pack years: 0.00    Total pack years: 20.00    Types: Cigarettes   Smokeless tobacco: Never  Vaping Use   Vaping Use: Never used  Substance and Sexual Activity   Alcohol use: Not Currently    Comment: Hx 3 vodka tonics daily, cutting back to 1/day   Drug use: Yes    Types: Marijuana    Comment: Last use 2 weeks ago   Sexual activity: Yes

## 2023-05-13 ENCOUNTER — Encounter: Payer: Self-pay | Admitting: Emergency Medicine

## 2023-12-24 ENCOUNTER — Other Ambulatory Visit: Payer: Self-pay | Admitting: *Deleted

## 2024-05-12 NOTE — Progress Notes (Addendum)
 Subjective   Patient ID:  Johnny Martinez is a 53 y.o. (DOB [birth year]) male    Patient presents with  . Annual Exam  . Medicare Wellness Visit     HPI  He was here today for his routine physical and wellness visit.  He overall is doing okay.  His neuropathy with his diabetes is progressively getting worse.  He is having to take at least 3 of the 300 gabapentin to get any type of relief from his amputation and stinging to both of his feet.  It is worse than his left.  His A1c is well-controlled at 6.6.  He continues to do extremely well with the Ozempic and glipizide .  He did recently have his eyes checked.  Continues to do well on his Pristiq and Adderall for his depression and ADHD.  He is up-to-date on all of his refills. Patient is currently taking stimulants.  They are aware of the abuse addictive potential of these medications.  They are reporting taking as prescribed without negative side effects.  They are aware that these medications can be altered and using in conjunction with benzodiazepines or opioids.  Extended discussion on abuse and addiction in conjunction with other medications.  He does take hydrocodone  as needed, he has plenty right now and does not need a refill. Patient is aware of the addiction and abuse potential associated with opioids.  They have safely been on prescriptions with no inherent side effects or contraindications.  They have no respiratory depression, aware need of Narcan prescription and in chart and sent.  Denies any side effects.  Will continue to try alternate means with Tylenol  NSAIDs and topical analgesics as appropriate.    Reviewed and updated this visit by provider: Tobacco  Allergies  Meds  Problems  Med Hx  Surg Hx  Fam Hx        Review of Systems  Constitutional:  Positive for activity change.  Respiratory: Negative.    Cardiovascular: Negative.   Gastrointestinal: Negative.   Neurological:  Positive for numbness.   Psychiatric/Behavioral: Negative.       Objective   Vitals:   05/12/24 1013  BP: 121/72  Patient Position: Sitting  Pulse: 84  Temp: 97.7 F (36.5 C)  TempSrc: Temporal  Resp: 18  Height: 6' 2 (1.88 m)  Weight: 239 lb (108.4 kg)  SpO2: 96%  BMI (Calculated): 30.7  PainSc:   3  PainLoc: Foot     Physical Exam Constitutional:      Appearance: Normal appearance.  HENT:     Head: Normocephalic.     Right Ear: Tympanic membrane, ear canal and external ear normal.     Left Ear: Tympanic membrane, ear canal and external ear normal.     Nose: Nose normal.     Mouth/Throat:     Mouth: Mucous membranes are moist.     Pharynx: Oropharynx is clear.   Eyes:     Extraocular Movements: Extraocular movements intact.     Conjunctiva/sclera: Conjunctivae normal.     Pupils: Pupils are equal, round, and reactive to light.    Cardiovascular:     Rate and Rhythm: Normal rate and regular rhythm.     Pulses: Normal pulses.   Musculoskeletal:        General: Normal range of motion.     Cervical back: Normal range of motion.  Pulmonary:     Effort: Pulmonary effort is normal.     Breath sounds: Normal breath sounds.  Abdominal:  General: Abdomen is flat. Bowel sounds are normal.     Palpations: Abdomen is soft.     Tenderness: There is no abdominal tenderness.   Skin:    General: Skin is warm and dry.     Capillary Refill: Capillary refill takes less than 2 seconds.  Neurological:     General: No focal deficit present.     Mental Status: He is alert and oriented to person, place, and time.     Gait: Gait abnormal.     Comments: Amputation to left foot   Psychiatric:        Mood and Affect: Mood normal.        Behavior: Behavior normal.        Thought Content: Thought content normal.        Judgment: Judgment normal.       Assessment and Plan  1. Medicare annual wellness visit, subsequent (Primary) 2. Annual physical exam 3. Type 2 diabetes mellitus with vascular  disease (*) -     CBC And Differential; Future -     Comprehensive Metabolic Panel; Future -     POCT A1C 4. Mixed hyperlipidemia -     Lipid Panel With LDL/HDL Ratio; Future 5. Benign prostatic hyperplasia with incomplete bladder emptying -     PSA; Future 6. Vitamin D  insufficiency -     Vitamin D  25 Hydroxy; Future 7. Medication management -     ToxASSURE Select 13; Future -     Vitamin B12; Future 8. Idiopathic progressive neuropathy -     gabapentin (NEURONTIN) 600 mg tablet; Take 1-2 tablets at bedtime for neurology, Normal -     predniSONE  (DELTASONE ) 20 mg tablet; Take two tablets daily for 7 days, Normal 9. Insomnia due to medical condition -     clonazePAM (KLONOPIN) 0.5 mg tablet; Take one tablet (0.5 mg dose) by mouth at bedtime as needed for Anxiety., Starting Fri 05/12/2024, Normal     Follow up in about 3 months (around 08/12/2024), or if symptoms worsen or fail to improve, for medication recheck.     - Health maintenance issues including appropriate cancer screening, healthy diet, exercise and tobacco avoidance were discussed with the patient.  I've encouraged healthy lifestyle modifications of eating fruits/vegetables, decreased fat intake, regular daily exercise, and decrease stress.  - Risks, benefits, and alternatives of the medications and treatment plan prescribed today were discussed, and patient expressed understanding.  - Labs ordered today:  We will call pt with results. - Discussed exercising 30 minutes at least 5 days per week and eating healthy, consistent of fruits, vegetables, and lean meats. - Follow up in about 1 year for annual physical exam, Chronic medical problems. - Return to clinic to be reevaluated if symptoms worsen, persist, change, or if you have any other concerns. - I discussed this diagnosis with the patient and discussed the treatment plan with them. This treatment plan is also outlined in the Patient Instructions and a copy of this was  provided to the patient.   Patient's Medications  New Prescriptions   GABAPENTIN (NEURONTIN) 600 MG TABLET    Take 1-2 tablets at bedtime for neurology   PREDNISONE  (DELTASONE ) 20 MG TABLET    Take two tablets daily for 7 days  Previous Medications   ALBUTEROL SULFATE HFA (PROVENTIL HFA) 108 (90 BASE) MCG/ACT INHALER    Inhale two puffs into the lungs every 6 (six) hours as needed for Wheezing.   AMPHETAMINE -DEXTROAMPHETAMINE  (ADDERALL XR) 10 MG  24 HR CAPSULE    Take one capsule (10 mg dose) by mouth daily.   AMPHETAMINE -DEXTROAMPHETAMINE  (ADDERALL) 10 MG TABLET    Take one tablet at 12 or 1pm in the afternoon   ATORVASTATIN CALCIUM  (LIPITOR) 10 MG TABLET    TAKE ONE TABLET BY MOUTH AT BEDTIME.   DESVENLAFAXINE (PRISTIQ) 25 MG 24 HR TABLET    TAKE 1 TABLET BY MOUTH EVERY DAY   DOCUSATE SODIUM  (COLACE) CAPSULE    Take one capsule (100 mg dose) by mouth 2 (two) times daily.   GLIPIZIDE  ER (GLUCOTROL  XL) 5 MG 24 HR TABLET    Take one tablet (5 mg dose) by mouth daily.   INSULIN  PEN NEEDLE (NOVOFINE PLUS PEN NEEDLE) 32G X 4 MM MISC    one each by Does not apply route daily.   SEMAGLUTIDE, 1 MG/DOSE, (OZEMPIC, 1 MG/DOSE,) 4 MG/3 ML SOPN PEN    INJECT 1 MG INTO THE SKIN ONE TIME PER WEEK   TORSEMIDE (DEMADEX) 20 MG TABLET    Take three tablets (60 mg dose) by mouth daily.   VALACYCLOVIR (VALTREX) 500 MG TABLET    TAKE ONE TABLET BY MOUTH TWICE A DAY AS NEEDED FOR COLD SORES  Modified Medications   Modified Medication Previous Medication   CLONAZEPAM (KLONOPIN) 0.5 MG TABLET clonazePAM (KLONOPIN) 0.5 mg tablet      Take one tablet (0.5 mg dose) by mouth at bedtime as needed for Anxiety.    TAKE ONE TABLET BY MOUTH AT BEDTIME AS NEEDED FOR ANXIETY. MAX DAILY AMOUNT: 0.5 MG  Discontinued Medications   GABAPENTIN (NEURONTIN) 300 MG CAPSULE    Take 1-2 tablets at bedtime   METHYLPREDNISOLONE (MEDROL DOSEPACK) 4 MG TABLET    follow package directions      Risks, benefits, and alternatives of the  medications and treatment plan prescribed today were discussed, and patient expressed understanding. Plan follow-up as discussed or as needed if any worsening symptoms or change in condition.   A yearly preventative health exam was recommended and current age based recommendations were discussed.  I have reviewed the information contained in this note and personally verified its accuracy.  MDM billing - I personally developed the plan of care based on documented medical decision making. Charmaine Heller, NP

## 2024-07-26 ENCOUNTER — Emergency Department (HOSPITAL_COMMUNITY)
Admission: EM | Admit: 2024-07-26 | Discharge: 2024-07-26 | Disposition: A | Discharge status: Home / Self Care | Attending: Emergency Medicine | Admitting: Emergency Medicine

## 2024-07-26 ENCOUNTER — Emergency Department (HOSPITAL_COMMUNITY)

## 2024-07-26 ENCOUNTER — Other Ambulatory Visit: Payer: Self-pay

## 2024-07-26 ENCOUNTER — Encounter (HOSPITAL_COMMUNITY): Payer: Self-pay

## 2024-07-26 DIAGNOSIS — M549 Dorsalgia, unspecified: Secondary | ICD-10-CM | POA: Diagnosis present

## 2024-07-26 DIAGNOSIS — Z7982 Long term (current) use of aspirin: Secondary | ICD-10-CM | POA: Diagnosis not present

## 2024-07-26 DIAGNOSIS — Z85828 Personal history of other malignant neoplasm of skin: Secondary | ICD-10-CM | POA: Insufficient documentation

## 2024-07-26 DIAGNOSIS — R109 Unspecified abdominal pain: Secondary | ICD-10-CM | POA: Insufficient documentation

## 2024-07-26 DIAGNOSIS — I10 Essential (primary) hypertension: Secondary | ICD-10-CM | POA: Diagnosis not present

## 2024-07-26 DIAGNOSIS — E119 Type 2 diabetes mellitus without complications: Secondary | ICD-10-CM | POA: Diagnosis not present

## 2024-07-26 DIAGNOSIS — Z7984 Long term (current) use of oral hypoglycemic drugs: Secondary | ICD-10-CM | POA: Diagnosis not present

## 2024-07-26 DIAGNOSIS — Z794 Long term (current) use of insulin: Secondary | ICD-10-CM | POA: Diagnosis not present

## 2024-07-26 DIAGNOSIS — Z8616 Personal history of COVID-19: Secondary | ICD-10-CM | POA: Insufficient documentation

## 2024-07-26 DIAGNOSIS — Z79899 Other long term (current) drug therapy: Secondary | ICD-10-CM | POA: Insufficient documentation

## 2024-07-26 LAB — BASIC METABOLIC PANEL WITH GFR
Anion gap: 10 (ref 5–15)
BUN: 19 mg/dL (ref 6–20)
CO2: 28 mmol/L (ref 22–32)
Calcium: 9 mg/dL (ref 8.9–10.3)
Chloride: 101 mmol/L (ref 98–111)
Creatinine, Ser: 0.71 mg/dL (ref 0.61–1.24)
GFR, Estimated: >60 mL/min (ref >60)
Glucose, Bld: 127 mg/dL — ABNORMAL HIGH (ref 70–99)
Potassium: 3.9 mmol/L (ref 3.5–5.1)
Sodium: 139 mmol/L (ref 135–145)

## 2024-07-26 LAB — CBC WITH DIFFERENTIAL/PLATELET
Abs Immature Granulocytes: 0.02 K/uL (ref 0.00–0.07)
Basophils Absolute: 0.1 K/uL (ref 0.0–0.1)
Basophils Relative: 1 %
Eosinophils Absolute: 0.5 K/uL (ref 0.0–0.5)
Eosinophils Relative: 8 %
HCT: 41.4 % (ref 39.0–52.0)
Hemoglobin: 13.8 g/dL (ref 13.0–17.0)
Immature Granulocytes: 0 %
Lymphocytes Relative: 35 %
Lymphs Abs: 2.2 K/uL (ref 0.7–4.0)
MCH: 31.2 pg (ref 26.0–34.0)
MCHC: 33.3 g/dL (ref 30.0–36.0)
MCV: 93.7 fL (ref 80.0–100.0)
Monocytes Absolute: 1.2 K/uL — ABNORMAL HIGH (ref 0.1–1.0)
Monocytes Relative: 18 %
Neutro Abs: 2.4 K/uL (ref 1.7–7.7)
Neutrophils Relative %: 38 %
Platelets: 265 K/uL (ref 150–400)
RBC: 4.42 MIL/uL (ref 4.22–5.81)
RDW: 12.9 % (ref 11.5–15.5)
WBC: 6.4 K/uL (ref 4.0–10.5)
nRBC: 0 % (ref 0.0–0.2)

## 2024-07-26 LAB — URINALYSIS, ROUTINE W REFLEX MICROSCOPIC
Bacteria, UA: NONE SEEN
Bilirubin Urine: NEGATIVE
Glucose, UA: NEGATIVE mg/dL
Ketones, ur: NEGATIVE mg/dL
Leukocytes,Ua: NEGATIVE
Nitrite: NEGATIVE
Protein, ur: NEGATIVE mg/dL
Specific Gravity, Urine: 1.031 — ABNORMAL HIGH (ref 1.005–1.030)
pH: 5 (ref 5.0–8.0)

## 2024-07-26 LAB — TROPONIN I (HIGH SENSITIVITY): Troponin I (High Sensitivity): 2 ng/L (ref <18)

## 2024-07-26 MED ORDER — KETOROLAC TROMETHAMINE 30 MG/ML IJ SOLN
30.0000 mg | Freq: Once | INTRAMUSCULAR | Status: AC
  Administered 2024-07-26: 30 mg via INTRAVENOUS
  Filled 2024-07-26: qty 1

## 2024-07-26 MED ORDER — IBUPROFEN 600 MG PO TABS: 600.0000 mg | ORAL_TABLET | Freq: Four times a day (QID) | ORAL | 0 refills | Status: AC | PRN

## 2024-07-26 MED ORDER — SODIUM CHLORIDE 0.9 % IV BOLUS
1000.0000 mL | Freq: Once | INTRAVENOUS | Status: AC
  Administered 2024-07-26: 1000 mL via INTRAVENOUS

## 2024-07-26 MED ORDER — OXYCODONE-ACETAMINOPHEN 5-325 MG PO TABS
1.0000 | ORAL_TABLET | Freq: Once | ORAL | Status: AC
  Administered 2024-07-26: 1 via ORAL
  Filled 2024-07-26: qty 1

## 2024-07-26 NOTE — Discharge Instructions (Signed)
 You were seen today for back and side pain.  The cause is unknown although your workup here has been reassuring.  Take ibuprofen  for pain.  One consideration is that sometimes you can get pain before getting a rash from shingles.  If you notice a rash, you should be reevaluated.  Follow-up with your primary doctor.

## 2024-07-26 NOTE — ED Triage Notes (Signed)
 P6t c/o left sided back pain that started this afternoon. No hx of back pain. Denies injury. Pt states he is on day 3 of COVID.

## 2024-07-26 NOTE — ED Notes (Signed)
 Lab called to add on troponin

## 2024-07-26 NOTE — ED Provider Notes (Signed)
 [facility] Provider Note   CSN: [id number] Arrival date & time: 07/26/24  9688     Patient presents with: No chief complaint on file.   Johnny Martinez is a 53 y.o. male.   HPI     This is a 53 year old male who presents with left-sided back pain.  Patient reports onset of symptoms last afternoon.  Currently diagnosed with COVID-19.  Has not really had any cough but has had fever.  Denies any heavy lifting or injury.  States that the pain is worse with certain movements.  He took a hydrocodone  at home with minimal relief.  Denies urinary symptoms or radiation of pain into the abdomen.  Prior to Admission medications   Medication Sig Start Date End Date Taking? Authorizing Provider  ibuprofen  (ADVIL ) 600 MG tablet Take 1 tablet (600 mg total) by mouth every 6 (six) hours as needed. 07/26/24  Yes Kao Berkheimer, Charmaine FALCON, MD  albuterol (VENTOLIN HFA) 108 (90 Base) MCG/ACT inhaler INHALE TWO PUFFS INTO THE LUNGS EVERY 6 HOURS AS NEEDED FOR WHEEZING OR SHORTNESS OF BREATH. Patient not taking: Reported on 01/06/2023 09/18/22   [provider]  amoxicillin -clavulanate (AUGMENTIN ) 875-125 MG tablet Take 1 tablet by mouth 2 (two) times daily. 01/11/23   Harden Jerona GAILS, MD  amphetamine -dextroamphetamine  (ADDERALL XR) 10 MG 24 hr capsule Take 10 mg by mouth daily. 04/11/21   [provider]  amphetamine -dextroamphetamine  (ADDERALL) 10 MG tablet Take 10 mg by mouth daily in the afternoon.    [provider]  Aspirin Effervescent (ALKA-SELTZER ORIGINAL PO) Take 1 packet by mouth as needed (reflux).    [provider]  Desvenlafaxine Succinate ER 25 MG TB24 Take 25 mg by mouth daily after lunch. 12/03/20   [provider]  doxycycline  (VIBRA -TABS) 100 MG tablet TAKE 1 TABLET BY MOUTH TWICE A DAY Patient not taking: Reported on 01/06/2023 01/29/22   Harden Jerona GAILS, MD  glipiZIDE  (GLUCOTROL  XL) 5 MG 24 hr tablet Take 1 tablet  (5 mg total) by mouth daily. 01/03/19   Bryn Bernardino NOVAK, MD  insulin  glargine (LANTUS ) 100 UNIT/ML injection Inject 24 Units into the skin at bedtime. Patient not taking: Reported on 01/06/2023    [provider]  insulin  glargine-yfgn (SEMGLEE ) 100 UNIT/ML Pen Inject 12 Units into the skin at bedtime. 12/14/22   [provider]  lisinopril -hydrochlorothiazide  (PRINZIDE ,ZESTORETIC ) 20-12.5 MG per tablet Take 1 tablet by mouth daily.    [provider]  oxyCODONE -acetaminophen  (PERCOCET/ROXICET) 5-325 MG tablet Take 1 tablet by mouth every 4 (four) hours as needed. 01/13/23   Zamora, Erin R, NP  OZEMPIC, 0.25 OR 0.5 MG/DOSE, 2 MG/3ML SOPN Inject 0.5 mg into the skin once a week. Patient not taking: Reported on 01/06/2023 12/16/22   [provider]  predniSONE  (DELTASONE ) 50 MG tablet Take one tablet by mouth once daily for 5 days. 03/30/23   Valdemar Rocky SAUNDERS, NP  tirzepatide [facility]) 2.5 MG/0.5ML Pen Inject 2.5 mg into the skin once a week. Takes on Wednesday    [provider]    Allergies: Iodine and Shellfish allergy    Review of Systems  Constitutional:  Negative for fever.  Respiratory:  Negative for shortness of breath.   Cardiovascular:  Negative for chest pain.  Gastrointestinal:  Negative for abdominal pain, nausea and vomiting.  Musculoskeletal:  Positive for back pain.  All other systems reviewed and are negative.   Updated Vital Signs BP 97/70  Pulse 67   Temp 97.8 F (36.6 C) (Oral)   Resp 15   SpO2 99%   Physical Exam Vitals and nursing note reviewed.  Constitutional:      Appearance: He is well-developed.  HENT:     Head: Normocephalic and atraumatic.  Eyes:     Pupils: Pupils are equal, round, and reactive to light.  Cardiovascular:     Rate and Rhythm: Normal rate and regular rhythm.     Heart sounds: Normal heart sounds. No murmur heard. Pulmonary:     Effort: Pulmonary effort is normal. No respiratory distress.     Breath  sounds: Normal breath sounds. No wheezing.  Abdominal:     General: Bowel sounds are normal.     Palpations: Abdomen is soft.     Tenderness: There is no abdominal tenderness. There is no rebound.  Musculoskeletal:     Cervical back: Neck supple.     Comments: Tenderness to palpation left upper back without spasm noted  Lymphadenopathy:     Cervical: No cervical adenopathy.  Skin:    General: Skin is warm and dry.  Neurological:     Mental Status: He is alert and oriented to person, place, and time.  Psychiatric:        Mood and Affect: Mood normal.     (all labs ordered are listed, but only abnormal results are displayed) Labs Reviewed  CBC WITH DIFFERENTIAL/PLATELET - Abnormal; Notable for the following components:      Result Value   Monocytes Absolute 1.2 (*)    All other components within normal limits  BASIC METABOLIC PANEL WITH GFR - Abnormal; Notable for the following components:   Glucose, Bld 127 (*)    All other components within normal limits  URINALYSIS, ROUTINE W REFLEX MICROSCOPIC - Abnormal; Notable for the following components:   Specific Gravity, Urine 1.031 (*)    Hgb urine dipstick MODERATE (*)    All other components within normal limits  TROPONIN I (HIGH SENSITIVITY)    EKG: EKG Interpretation Date/Time:  Wednesday July 26 2024 04:02:47 EDT Ventricular Rate:  78 PR Interval:  188 QRS Duration:  153 QT Interval:  405 QTC Calculation: 462 R Axis:   -62  Text Interpretation: Sinus rhythm Right bundle branch block , new Inferior infarct, old Baseline wander in lead(s) III Confirmed by Bari Pfeiffer ([id number]) on 07/26/2024 6:28:04 AM  Radiology: CT Renal Stone Study Result Date: 07/26/2024 EXAM: CT UROGRAM 07/26/2024 05:46:49 AM TECHNIQUE: CT of the abdomen and pelvis was performed without the administration of intravenous contrast as per CT urogram protocol. Multiplanar reformatted images as well as MIP urogram images are provided for review.  Automated exposure control, iterative reconstruction, and/or weight based adjustment of the mA/kV was utilized to reduce the radiation dose to as low as reasonably achievable. COMPARISON: None available. CLINICAL HISTORY: Abdominal/flank pain, stone suspected. c/o left sided back pain that started this afternoon. No hx of back pain. Denies injury. FINDINGS: LOWER CHEST: No acute abnormality. LIVER: The liver is unremarkable. GALLBLADDER AND BILE DUCTS: Gallbladder is unremarkable. No biliary ductal dilatation. SPLEEN: No acute abnormality. PANCREAS: No acute abnormality. ADRENAL GLANDS: No acute abnormality. KIDNEYS, URETERS AND BLADDER: Simple-appearing bilateral renal cysts present, with the cyst on the right measuring 2.5 cm in diameter and the 1 on the left measuring 4.4 cm. Per consensus, no follow-up is needed for simple Bosniak type 1 and 2 renal cysts, unless the patient has a malignancy history or risk factors. 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 No stones in the kidneys. The ureters are normal in caliber and free of calculi. No hydronephrosis. No perinephric or periureteral stranding. Urinary bladder is unremarkable. GI AND BOWEL: Stomach demonstrates no acute abnormality. There is no bowel obstruction. PERITONEUM AND RETROPERITONEUM: No ascites. No free air. VASCULATURE: Aorta is normal in caliber. There is mild-to-moderate calcific atheromatous disease within the abdominal aorta. LYMPH NODES: No lymphadenopathy. REPRODUCTIVE ORGANS: No acute abnormality. BONES AND SOFT TISSUES: No acute osseous abnormality. No focal soft tissue abnormality. IMPRESSION: 1. No nephrolithiasis or obstructive uropathy. Electronically signed by: Evalene Coho MD 07/26/2024 05:53 AM EDT RP Workstation: [id number]   DG Chest Portable 1 View Result Date: 07/26/2024 EXAM: 1 VIEW(S) XRAY OF THE CHEST 07/26/2024 04:01:00 AM COMPARISON: PA and  lateral radiographs of the chest dated 01/01/2019. CLINICAL HISTORY: pain. SHOB, back pain/discomfort, restless FINDINGS: LUNGS AND PLEURA: No focal pulmonary opacity. No pulmonary edema. No pleural effusion. No pneumothorax. HEART AND MEDIASTINUM: No acute abnormality of the cardiac and mediastinal silhouettes. BONES AND SOFT TISSUES: No acute osseous abnormality. IMPRESSION: 1. No acute process. Electronically signed by: Evalene Coho MD 07/26/2024 04:12 AM EDT RP Workstation: [id number]     Procedures   Medications Ordered in the ED  oxyCODONE -acetaminophen  (PERCOCET/ROXICET) 5-325 MG per tablet 1 tablet (1 tablet Oral Given 07/26/24 0350)  ketorolac  (TORADOL ) 30 MG/ML injection 30 mg (30 mg Intravenous Given 07/26/24 0537)  sodium chloride  0.9 % bolus 1,000 mL (0 mLs Intravenous Stopped 07/26/24 0727)                                    Medical Decision Making Amount and/or Complexity of Data Reviewed Labs: ordered. Radiology: ordered.  Risk Prescription drug management.   This patient presents to the ED for concern of left back and flank pain, this involves an extensive number of treatment options, and is a complaint that carries with it a high risk of complications and morbidity.  I considered the following differential and admission for this acute, potentially life threatening condition.  The differential diagnosis includes musculoskeletal pain or strain, shingles, kidney stone, UTI, pneumonia  MDM:    This is a 53 year old male who presents with left back pain.  It is really the left flank and upper back.  Nontoxic.  Vital signs reassuring.  Currently has COVID.  Patient was given oxycodone  and Toradol .  Labs obtained.  Labs largely reassuring.  EKG shows a new right bundle branch block.  No chest pain.  Will add a troponin.  Chest x-ray is reassuring.  No pneumonia.  Labs are largely reassuring with exception of some hemoglobin the urine.  Could presumably be a kidney stone.  CT  stone study is negative.  Overall workup has been reassuring without clear etiology of symptoms.  (Labs, imaging, consults)  Labs: I Ordered, and personally interpreted labs.  The pertinent results include:  CBC, BMP, troponin, urinalysis  Imaging Studies ordered: I ordered imaging studies including CT stone study, x-ray I independently visualized and interpreted imaging. I agree with the radiologist interpretation  Additional history obtained from chart review.  External records from outside source obtained and reviewed including prior evaluation  Cardiac Monitoring: The patient was maintained on a cardiac monitor.  If on the cardiac monitor, I personally viewed and interpreted the cardiac monitored which showed an underlying rhythm of: Sinus  Reevaluation: After the interventions noted above, I reevaluated the patient and found that they have :improved  Social Determinants of Health:  lives independently  Disposition: Discharge  Co morbidities that complicate the patient evaluation  Past Medical History:  Diagnosis Date   ADD (attention deficit disorder)    adult   Anxiety and depression    Arthritis    probably a touch in my hands (11/12/2017)   Cancer (HCC) 12/2022   skin cancer - right mid arm   COVID 10/03/2021   Diabetes mellitus without complication (HCC)    type 2   GERD (gastroesophageal reflux disease)    no meds, diet   Hypertension    Migraine    maybe 2 in my lifetime (11/12/2017)   MRSA infection (methicillin-resistant Staphylococcus aureus)    Neuropathy    lower legs/feet   OSA on CPAP     not using at this time   Restless legs      Medicines Meds ordered this encounter  Medications   oxyCODONE -acetaminophen  (PERCOCET/ROXICET) 5-325 MG per tablet 1 tablet    Refill:  0   ketorolac  (TORADOL ) 30 MG/ML injection 30 mg   sodium chloride  0.9 % bolus 1,000 mL   ibuprofen  (ADVIL ) 600 MG tablet    Sig: Take 1 tablet (600 mg total) by mouth every  6 (six) hours as needed.    Dispense:  30 tablet    Refill:  0    I have reviewed the patients home medicines and have made adjustments as needed  Problem List / ED Course: Problem List Items Addressed This Visit   None Visit Diagnoses       Flank pain    -  Primary                Final diagnoses:  Flank pain    ED Discharge Orders          Ordered    ibuprofen  (ADVIL ) 600 MG tablet  Every 6 hours PRN        07/26/24 9376               Bari Charmaine FALCON, MD 07/26/24 2314
